# Patient Record
Sex: Male | Born: 1955 | Race: White | Hispanic: No | Smoking: Never smoker
Health system: Southern US, Community
[De-identification: ages and names within clinical notes are randomized; demographics above are authoritative.]

## PROBLEM LIST (undated history)

## (undated) DIAGNOSIS — F419 Anxiety disorder, unspecified: Secondary | ICD-10-CM

## (undated) DIAGNOSIS — M109 Gout, unspecified: Secondary | ICD-10-CM

## (undated) DIAGNOSIS — R197 Diarrhea, unspecified: Secondary | ICD-10-CM

## (undated) DIAGNOSIS — G4730 Sleep apnea, unspecified: Secondary | ICD-10-CM

## (undated) DIAGNOSIS — G629 Polyneuropathy, unspecified: Secondary | ICD-10-CM

## (undated) DIAGNOSIS — I10 Essential (primary) hypertension: Secondary | ICD-10-CM

## (undated) DIAGNOSIS — R42 Dizziness and giddiness: Secondary | ICD-10-CM

## (undated) DIAGNOSIS — E119 Type 2 diabetes mellitus without complications: Secondary | ICD-10-CM

## (undated) DIAGNOSIS — E7800 Pure hypercholesterolemia, unspecified: Secondary | ICD-10-CM

## (undated) HISTORY — DX: Essential (primary) hypertension: I10

## (undated) HISTORY — DX: Diarrhea, unspecified: R19.7

## (undated) HISTORY — DX: Pure hypercholesterolemia, unspecified: E78.00

## (undated) HISTORY — PX: OTHER SURGICAL HISTORY: SHX169

## (undated) HISTORY — DX: Type 2 diabetes mellitus without complications: E11.9

## (undated) SURGERY — COLONOSCOPY
Anesthesia: Moderate Sedation

---

## 2006-10-25 ENCOUNTER — Ambulatory Visit (HOSPITAL_COMMUNITY): Admission: RE | Admit: 2006-10-25 | Discharge: 2006-10-25 | Payer: Self-pay | Admitting: Neurology

## 2015-09-23 DIAGNOSIS — J208 Acute bronchitis due to other specified organisms: Secondary | ICD-10-CM | POA: Diagnosis not present

## 2015-09-23 DIAGNOSIS — Z0000 Encounter for general adult medical examination without abnormal findings: Secondary | ICD-10-CM | POA: Diagnosis not present

## 2015-09-23 DIAGNOSIS — I10 Essential (primary) hypertension: Secondary | ICD-10-CM | POA: Diagnosis not present

## 2015-10-20 DIAGNOSIS — J310 Chronic rhinitis: Secondary | ICD-10-CM | POA: Diagnosis not present

## 2015-10-20 DIAGNOSIS — H6983 Other specified disorders of Eustachian tube, bilateral: Secondary | ICD-10-CM | POA: Diagnosis not present

## 2015-10-20 DIAGNOSIS — H903 Sensorineural hearing loss, bilateral: Secondary | ICD-10-CM | POA: Diagnosis not present

## 2015-10-20 DIAGNOSIS — R42 Dizziness and giddiness: Secondary | ICD-10-CM | POA: Diagnosis not present

## 2015-10-20 DIAGNOSIS — J343 Hypertrophy of nasal turbinates: Secondary | ICD-10-CM | POA: Diagnosis not present

## 2015-10-22 DIAGNOSIS — H6522 Chronic serous otitis media, left ear: Secondary | ICD-10-CM | POA: Diagnosis not present

## 2015-10-22 DIAGNOSIS — H6982 Other specified disorders of Eustachian tube, left ear: Secondary | ICD-10-CM | POA: Diagnosis not present

## 2015-11-16 DIAGNOSIS — R5383 Other fatigue: Secondary | ICD-10-CM | POA: Diagnosis not present

## 2015-11-16 DIAGNOSIS — Z1389 Encounter for screening for other disorder: Secondary | ICD-10-CM | POA: Diagnosis not present

## 2015-11-16 DIAGNOSIS — E559 Vitamin D deficiency, unspecified: Secondary | ICD-10-CM | POA: Diagnosis not present

## 2015-11-16 DIAGNOSIS — Z0000 Encounter for general adult medical examination without abnormal findings: Secondary | ICD-10-CM | POA: Diagnosis not present

## 2015-11-16 DIAGNOSIS — I10 Essential (primary) hypertension: Secondary | ICD-10-CM | POA: Diagnosis not present

## 2015-11-18 DIAGNOSIS — G4710 Hypersomnia, unspecified: Secondary | ICD-10-CM | POA: Diagnosis not present

## 2015-11-18 DIAGNOSIS — Z79899 Other long term (current) drug therapy: Secondary | ICD-10-CM | POA: Diagnosis not present

## 2015-11-18 DIAGNOSIS — M545 Low back pain: Secondary | ICD-10-CM | POA: Diagnosis not present

## 2015-11-18 DIAGNOSIS — M519 Unspecified thoracic, thoracolumbar and lumbosacral intervertebral disc disorder: Secondary | ICD-10-CM | POA: Diagnosis not present

## 2015-12-09 ENCOUNTER — Ambulatory Visit (INDEPENDENT_AMBULATORY_CARE_PROVIDER_SITE_OTHER): Payer: Medicare Other | Admitting: Otolaryngology

## 2015-12-09 DIAGNOSIS — H903 Sensorineural hearing loss, bilateral: Secondary | ICD-10-CM

## 2015-12-09 DIAGNOSIS — H7202 Central perforation of tympanic membrane, left ear: Secondary | ICD-10-CM

## 2015-12-09 DIAGNOSIS — H6983 Other specified disorders of Eustachian tube, bilateral: Secondary | ICD-10-CM | POA: Diagnosis not present

## 2016-02-17 DIAGNOSIS — E1165 Type 2 diabetes mellitus with hyperglycemia: Secondary | ICD-10-CM | POA: Diagnosis not present

## 2016-02-17 DIAGNOSIS — I10 Essential (primary) hypertension: Secondary | ICD-10-CM | POA: Diagnosis not present

## 2016-02-17 DIAGNOSIS — Z131 Encounter for screening for diabetes mellitus: Secondary | ICD-10-CM | POA: Diagnosis not present

## 2016-02-17 DIAGNOSIS — L570 Actinic keratosis: Secondary | ICD-10-CM | POA: Diagnosis not present

## 2016-02-22 DIAGNOSIS — G4733 Obstructive sleep apnea (adult) (pediatric): Secondary | ICD-10-CM | POA: Diagnosis not present

## 2016-02-22 DIAGNOSIS — M519 Unspecified thoracic, thoracolumbar and lumbosacral intervertebral disc disorder: Secondary | ICD-10-CM | POA: Diagnosis not present

## 2016-02-22 DIAGNOSIS — M545 Low back pain: Secondary | ICD-10-CM | POA: Diagnosis not present

## 2016-02-22 DIAGNOSIS — Z79899 Other long term (current) drug therapy: Secondary | ICD-10-CM | POA: Diagnosis not present

## 2016-02-22 DIAGNOSIS — G4710 Hypersomnia, unspecified: Secondary | ICD-10-CM | POA: Diagnosis not present

## 2016-03-27 DIAGNOSIS — G4733 Obstructive sleep apnea (adult) (pediatric): Secondary | ICD-10-CM | POA: Diagnosis not present

## 2016-03-30 DIAGNOSIS — G4733 Obstructive sleep apnea (adult) (pediatric): Secondary | ICD-10-CM | POA: Diagnosis not present

## 2016-04-04 DIAGNOSIS — G4733 Obstructive sleep apnea (adult) (pediatric): Secondary | ICD-10-CM | POA: Diagnosis not present

## 2016-04-04 DIAGNOSIS — M79673 Pain in unspecified foot: Secondary | ICD-10-CM | POA: Diagnosis not present

## 2016-04-04 DIAGNOSIS — G4710 Hypersomnia, unspecified: Secondary | ICD-10-CM | POA: Diagnosis not present

## 2016-04-04 DIAGNOSIS — M545 Low back pain: Secondary | ICD-10-CM | POA: Diagnosis not present

## 2016-04-13 ENCOUNTER — Other Ambulatory Visit (HOSPITAL_COMMUNITY): Payer: Self-pay | Admitting: Respiratory Therapy

## 2016-04-13 DIAGNOSIS — G4733 Obstructive sleep apnea (adult) (pediatric): Secondary | ICD-10-CM

## 2016-04-30 DIAGNOSIS — G4733 Obstructive sleep apnea (adult) (pediatric): Secondary | ICD-10-CM | POA: Diagnosis not present

## 2016-05-16 DIAGNOSIS — I10 Essential (primary) hypertension: Secondary | ICD-10-CM | POA: Diagnosis not present

## 2016-05-16 DIAGNOSIS — E1165 Type 2 diabetes mellitus with hyperglycemia: Secondary | ICD-10-CM | POA: Diagnosis not present

## 2016-05-23 DIAGNOSIS — E1165 Type 2 diabetes mellitus with hyperglycemia: Secondary | ICD-10-CM | POA: Diagnosis not present

## 2016-05-23 DIAGNOSIS — I10 Essential (primary) hypertension: Secondary | ICD-10-CM | POA: Diagnosis not present

## 2016-05-23 DIAGNOSIS — N401 Enlarged prostate with lower urinary tract symptoms: Secondary | ICD-10-CM | POA: Diagnosis not present

## 2016-05-23 DIAGNOSIS — M1049 Other secondary gout, multiple sites: Secondary | ICD-10-CM | POA: Diagnosis not present

## 2016-05-31 ENCOUNTER — Encounter (INDEPENDENT_AMBULATORY_CARE_PROVIDER_SITE_OTHER): Payer: Self-pay

## 2016-05-31 ENCOUNTER — Ambulatory Visit: Payer: Medicare Other | Attending: Neurology | Admitting: Neurology

## 2016-05-31 DIAGNOSIS — G4733 Obstructive sleep apnea (adult) (pediatric): Secondary | ICD-10-CM | POA: Diagnosis not present

## 2016-05-31 DIAGNOSIS — Z79899 Other long term (current) drug therapy: Secondary | ICD-10-CM | POA: Insufficient documentation

## 2016-06-08 ENCOUNTER — Ambulatory Visit (INDEPENDENT_AMBULATORY_CARE_PROVIDER_SITE_OTHER): Payer: Medicare Other | Admitting: Otolaryngology

## 2016-06-12 NOTE — Procedures (Signed)
   Camden A. Merlene Laughter, MD     [email or web address]             NOCTURNAL POLYSOMNOGRAPHY   LOCATION: [facility]  Patient Name: Louis, House Date: 05/31/2016 Gender: Male D.O.B: [birth year] Age (years): 63 Referring Provider: Phillips Odor MD, ABSM Height (inches): 73 Interpreting Physician: Phillips Odor MD, ABSM Weight (lbs): 300 RPSGT: Peak, Robert BMI: 40 MRN: [id number] Neck Size: 20.00 CLINICAL INFORMATION Sleep Study Type: NPSG Indication for sleep study: N/A Epworth Sleepiness Score: 10 SLEEP STUDY TECHNIQUE As per the AASM Manual for the Scoring of Sleep and Associated Events v2.3 (April 2016) with a hypopnea requiring 4% desaturations. The channels recorded and monitored were frontal, central and occipital EEG, electrooculogram (EOG), submentalis EMG (chin), nasal and oral airflow, thoracic and abdominal wall motion, anterior tibialis EMG, snore microphone, electrocardiogram, and pulse oximetry. MEDICATIONS Patient's medications include: N/A. Medications self-administered by patient during sleep study : No sleep medicine administered. No current outpatient prescriptions on file. SLEEP ARCHITECTURE The study was initiated at 10:20:31 PM and ended at 5:06:46 AM. Sleep onset time was 79.8 minutes and the sleep efficiency was 62.0%. The total sleep time was 251.9 minutes. Stage REM latency was N/A minutes. The patient spent 15.06% of the night in stage N1 sleep, 84.94% in stage N2 sleep, 0.00% in stage N3 and 0.00% in REM. Alpha intrusion was absent. Supine sleep was 58.43%. RESPIRATORY PARAMETERS The overall apnea/hypopnea index (AHI) was 28.8 per hour. There were 14 total apneas, including 14 obstructive, 0 central and 0 mixed apneas. There were 107 hypopneas and 5 RERAs. The AHI during Stage REM sleep was N/A per hour. AHI while supine was 48.1 per hour. The mean oxygen saturation was 90.77%. The minimum SpO2 during sleep was  81.00%. Loud snoring was noted during this study. CARDIAC DATA The 2 lead EKG demonstrated sinus rhythm. The mean heart rate was 70.73 beats per minute. Other EKG findings include: None. LEG MOVEMENT DATA The total PLMS were 0 with a resulting PLMS index of 0.00. Associated arousal with leg movement index was 0.0.   IMPRESSIONS - Moderate obstructive sleep apnea. A formal CPAP titration study is suggested. 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   Delano Metz, MD Diplomate, American Board of Sleep Medicine.

## 2016-06-19 DIAGNOSIS — Z79891 Long term (current) use of opiate analgesic: Secondary | ICD-10-CM | POA: Diagnosis not present

## 2016-06-19 DIAGNOSIS — M79673 Pain in unspecified foot: Secondary | ICD-10-CM | POA: Diagnosis not present

## 2016-06-19 DIAGNOSIS — M545 Low back pain: Secondary | ICD-10-CM | POA: Diagnosis not present

## 2016-06-19 DIAGNOSIS — M519 Unspecified thoracic, thoracolumbar and lumbosacral intervertebral disc disorder: Secondary | ICD-10-CM | POA: Diagnosis not present

## 2016-06-19 DIAGNOSIS — G4710 Hypersomnia, unspecified: Secondary | ICD-10-CM | POA: Diagnosis not present

## 2016-06-28 DIAGNOSIS — G4733 Obstructive sleep apnea (adult) (pediatric): Secondary | ICD-10-CM | POA: Diagnosis not present

## 2016-08-24 DIAGNOSIS — M545 Low back pain: Secondary | ICD-10-CM | POA: Diagnosis not present

## 2016-08-24 DIAGNOSIS — Z79899 Other long term (current) drug therapy: Secondary | ICD-10-CM | POA: Diagnosis not present

## 2016-08-24 DIAGNOSIS — M79673 Pain in unspecified foot: Secondary | ICD-10-CM | POA: Diagnosis not present

## 2016-08-24 DIAGNOSIS — R252 Cramp and spasm: Secondary | ICD-10-CM | POA: Diagnosis not present

## 2016-08-24 DIAGNOSIS — E1140 Type 2 diabetes mellitus with diabetic neuropathy, unspecified: Secondary | ICD-10-CM | POA: Diagnosis not present

## 2016-08-29 DIAGNOSIS — I10 Essential (primary) hypertension: Secondary | ICD-10-CM | POA: Diagnosis not present

## 2016-08-29 DIAGNOSIS — E1165 Type 2 diabetes mellitus with hyperglycemia: Secondary | ICD-10-CM | POA: Diagnosis not present

## 2016-08-29 DIAGNOSIS — Z125 Encounter for screening for malignant neoplasm of prostate: Secondary | ICD-10-CM | POA: Diagnosis not present

## 2016-08-29 DIAGNOSIS — M1049 Other secondary gout, multiple sites: Secondary | ICD-10-CM | POA: Diagnosis not present

## 2016-08-29 DIAGNOSIS — N401 Enlarged prostate with lower urinary tract symptoms: Secondary | ICD-10-CM | POA: Diagnosis not present

## 2016-09-05 DIAGNOSIS — I10 Essential (primary) hypertension: Secondary | ICD-10-CM | POA: Diagnosis not present

## 2016-09-05 DIAGNOSIS — E1165 Type 2 diabetes mellitus with hyperglycemia: Secondary | ICD-10-CM | POA: Diagnosis not present

## 2016-09-06 DIAGNOSIS — H25813 Combined forms of age-related cataract, bilateral: Secondary | ICD-10-CM | POA: Diagnosis not present

## 2016-09-06 DIAGNOSIS — E119 Type 2 diabetes mellitus without complications: Secondary | ICD-10-CM | POA: Diagnosis not present

## 2016-09-06 DIAGNOSIS — Z7984 Long term (current) use of oral hypoglycemic drugs: Secondary | ICD-10-CM | POA: Diagnosis not present

## 2016-09-06 DIAGNOSIS — E1165 Type 2 diabetes mellitus with hyperglycemia: Secondary | ICD-10-CM | POA: Diagnosis not present

## 2016-09-30 DIAGNOSIS — G4733 Obstructive sleep apnea (adult) (pediatric): Secondary | ICD-10-CM | POA: Diagnosis not present

## 2016-10-23 DIAGNOSIS — M1009 Idiopathic gout, multiple sites: Secondary | ICD-10-CM | POA: Diagnosis not present

## 2016-10-23 DIAGNOSIS — E1140 Type 2 diabetes mellitus with diabetic neuropathy, unspecified: Secondary | ICD-10-CM | POA: Diagnosis not present

## 2016-10-23 DIAGNOSIS — Z79899 Other long term (current) drug therapy: Secondary | ICD-10-CM | POA: Diagnosis not present

## 2016-10-23 DIAGNOSIS — R252 Cramp and spasm: Secondary | ICD-10-CM | POA: Diagnosis not present

## 2016-10-23 DIAGNOSIS — M545 Low back pain: Secondary | ICD-10-CM | POA: Diagnosis not present

## 2016-10-31 DIAGNOSIS — G4733 Obstructive sleep apnea (adult) (pediatric): Secondary | ICD-10-CM | POA: Diagnosis not present

## 2016-11-09 DIAGNOSIS — I10 Essential (primary) hypertension: Secondary | ICD-10-CM | POA: Diagnosis not present

## 2016-11-09 DIAGNOSIS — N401 Enlarged prostate with lower urinary tract symptoms: Secondary | ICD-10-CM | POA: Diagnosis not present

## 2016-11-09 DIAGNOSIS — H8113 Benign paroxysmal vertigo, bilateral: Secondary | ICD-10-CM | POA: Diagnosis not present

## 2016-11-21 DIAGNOSIS — R197 Diarrhea, unspecified: Secondary | ICD-10-CM | POA: Diagnosis not present

## 2016-11-23 DIAGNOSIS — Z0000 Encounter for general adult medical examination without abnormal findings: Secondary | ICD-10-CM | POA: Diagnosis not present

## 2016-11-23 DIAGNOSIS — I10 Essential (primary) hypertension: Secondary | ICD-10-CM | POA: Diagnosis not present

## 2016-11-23 DIAGNOSIS — H8113 Benign paroxysmal vertigo, bilateral: Secondary | ICD-10-CM | POA: Diagnosis not present

## 2016-11-26 DIAGNOSIS — R197 Diarrhea, unspecified: Secondary | ICD-10-CM | POA: Diagnosis not present

## 2016-11-28 DIAGNOSIS — G4733 Obstructive sleep apnea (adult) (pediatric): Secondary | ICD-10-CM | POA: Diagnosis not present

## 2016-12-01 DIAGNOSIS — R197 Diarrhea, unspecified: Secondary | ICD-10-CM | POA: Diagnosis not present

## 2016-12-20 DIAGNOSIS — M519 Unspecified thoracic, thoracolumbar and lumbosacral intervertebral disc disorder: Secondary | ICD-10-CM | POA: Diagnosis not present

## 2016-12-20 DIAGNOSIS — E1140 Type 2 diabetes mellitus with diabetic neuropathy, unspecified: Secondary | ICD-10-CM | POA: Diagnosis not present

## 2016-12-20 DIAGNOSIS — M79673 Pain in unspecified foot: Secondary | ICD-10-CM | POA: Diagnosis not present

## 2016-12-20 DIAGNOSIS — M545 Low back pain: Secondary | ICD-10-CM | POA: Diagnosis not present

## 2016-12-20 DIAGNOSIS — Z79891 Long term (current) use of opiate analgesic: Secondary | ICD-10-CM | POA: Diagnosis not present

## 2016-12-20 DIAGNOSIS — Z79899 Other long term (current) drug therapy: Secondary | ICD-10-CM | POA: Diagnosis not present

## 2016-12-29 DIAGNOSIS — G4733 Obstructive sleep apnea (adult) (pediatric): Secondary | ICD-10-CM | POA: Diagnosis not present

## 2017-01-10 ENCOUNTER — Encounter (INDEPENDENT_AMBULATORY_CARE_PROVIDER_SITE_OTHER): Payer: Self-pay | Admitting: Internal Medicine

## 2017-01-12 ENCOUNTER — Ambulatory Visit (INDEPENDENT_AMBULATORY_CARE_PROVIDER_SITE_OTHER): Payer: Medicare Other | Admitting: Internal Medicine

## 2017-01-12 ENCOUNTER — Encounter (INDEPENDENT_AMBULATORY_CARE_PROVIDER_SITE_OTHER): Payer: Self-pay

## 2017-01-12 ENCOUNTER — Encounter (INDEPENDENT_AMBULATORY_CARE_PROVIDER_SITE_OTHER): Payer: Self-pay | Admitting: Internal Medicine

## 2017-01-12 VITALS — BP 116/72 | HR 72 | Temp 98.0°F | Ht 73.0 in | Wt 301.5 lb

## 2017-01-12 DIAGNOSIS — E7800 Pure hypercholesterolemia, unspecified: Secondary | ICD-10-CM

## 2017-01-12 DIAGNOSIS — R634 Abnormal weight loss: Secondary | ICD-10-CM | POA: Diagnosis not present

## 2017-01-12 DIAGNOSIS — R197 Diarrhea, unspecified: Secondary | ICD-10-CM | POA: Diagnosis not present

## 2017-01-12 DIAGNOSIS — E119 Type 2 diabetes mellitus without complications: Secondary | ICD-10-CM

## 2017-01-12 DIAGNOSIS — I10 Essential (primary) hypertension: Secondary | ICD-10-CM

## 2017-01-12 HISTORY — DX: Type 2 diabetes mellitus without complications: E11.9

## 2017-01-12 HISTORY — DX: Essential (primary) hypertension: I10

## 2017-01-12 HISTORY — DX: Pure hypercholesterolemia, unspecified: E78.00

## 2017-01-12 NOTE — Progress Notes (Addendum)
   Subjective:    Patient ID: Louis House, male    DOB: [birth year], 60 y.o.   MRN: [id number]  HPI Referred by Dr. Sherrie Sport for diarrhea. Stool studies 11/26/16 revealed  Moderate growth of staph. Covered with Bactrim DS BID x 10 days.  He says first of year he had a URI infection and was on antibiotic. He also  Had inner ear and was given another antibiotic.  He says after taking antibiotics, he developed diarrhea. He says he was treated for the diarrhea x 2 x 10 days.  He says certain foods would also cause diarrhea. Cheese gives him diarrhea.  He tells me now he is having about 2-3 stools a day. Stools are mushy. Some stools are formed.   His appetite is good.  He says he has lost about 20 pound since March.  No abdominal pain.  He says he feels 50% better.  His last colonoscopy was 8 yrs ago by Dr. Rowe Pavy and was normal.  H. Pylori was negative. C diff was negative.   Last colonoscopy in 2009 Dr. Rowe Pavy (screening) was normal. Moderate diverticulosis seen left colon.  Diabetic for a couple of years.   Review of Systems Past Medical History:  Diagnosis Date  . Diabetes ([place]) 01/12/2017  . Essential hypertension, benign 01/12/2017  . High cholesterol 01/12/2017    No past surgical history on file.  No Known Allergies  No current outpatient prescriptions on file prior to visit.   No current facility-administered medications on file prior to visit.        Objective:   Physical Exam Blood pressure 116/72, pulse 72, temperature 98 F (36.7 C), height 6' 1"  (1.854 m), weight (!) 301 lb 8 oz (136.8 kg).  Alert and oriented. Skin warm and dry. Oral mucosa is moist.   . Sclera anicteric, conjunctivae is pink. Thyroid not enlarged. No cervical lymphadenopathy. Lungs clear. Heart regular rate and rhythm.  Abdomen is soft. Bowel sounds are positive. No hepatomegaly. No abdominal masses felt. No tenderness.  No edema to lower extremities.         Assessment & Plan:  Diarrhea, weight loss.  GI pathogen. CT abdomen/pelvis with CM

## 2017-01-12 NOTE — Patient Instructions (Signed)
Labs and CT.

## 2017-01-13 DIAGNOSIS — R197 Diarrhea, unspecified: Secondary | ICD-10-CM | POA: Diagnosis not present

## 2017-01-18 DIAGNOSIS — R197 Diarrhea, unspecified: Secondary | ICD-10-CM | POA: Diagnosis not present

## 2017-01-18 DIAGNOSIS — I700 Atherosclerosis of aorta: Secondary | ICD-10-CM | POA: Diagnosis not present

## 2017-01-18 DIAGNOSIS — K5730 Diverticulosis of large intestine without perforation or abscess without bleeding: Secondary | ICD-10-CM | POA: Diagnosis not present

## 2017-01-22 ENCOUNTER — Telehealth (INDEPENDENT_AMBULATORY_CARE_PROVIDER_SITE_OTHER): Payer: Self-pay | Admitting: *Deleted

## 2017-01-22 ENCOUNTER — Encounter (INDEPENDENT_AMBULATORY_CARE_PROVIDER_SITE_OTHER): Payer: Self-pay

## 2017-01-22 NOTE — Telephone Encounter (Signed)
Patient called and left a voicemail. He states that he was calling Terri Setzer,NP. He had a CT last Thursday , and stools done week before. He ask that you call him back at [phone]

## 2017-01-23 ENCOUNTER — Telehealth (INDEPENDENT_AMBULATORY_CARE_PROVIDER_SITE_OTHER): Payer: Self-pay | Admitting: *Deleted

## 2017-01-23 NOTE — Telephone Encounter (Signed)
I called patient yesterday and have left a message today about his results. I do have GI pathogen yet.

## 2017-01-23 NOTE — Telephone Encounter (Signed)
Patient called left message stating he will be mowing most of the day and when he gets a call back he asked for it to be around 4:00. Per Patient Karna Christmas is the one returning his call.

## 2017-01-24 ENCOUNTER — Telehealth (INDEPENDENT_AMBULATORY_CARE_PROVIDER_SITE_OTHER): Payer: Self-pay | Admitting: Internal Medicine

## 2017-01-24 ENCOUNTER — Encounter (INDEPENDENT_AMBULATORY_CARE_PROVIDER_SITE_OTHER): Payer: Self-pay | Admitting: *Deleted

## 2017-01-24 DIAGNOSIS — R197 Diarrhea, unspecified: Secondary | ICD-10-CM

## 2017-01-24 MED ORDER — DICYCLOMINE HCL 10 MG PO CAPS: 10.0000 mg | ORAL_CAPSULE | Freq: Three times a day (TID) | ORAL | 5 refills | Status: DC

## 2017-01-24 NOTE — Telephone Encounter (Signed)
[name], OV in  4 weeks.

## 2017-01-24 NOTE — Telephone Encounter (Signed)
Patient called left message returning Terri's call. Please call patient at [phone]

## 2017-01-24 NOTE — Telephone Encounter (Signed)
Terri tried calling the patient on the morning on 01/23/17. No answer. This message was sent to her as well , and she will contact patient.

## 2017-01-24 NOTE — Telephone Encounter (Signed)
OV sch'd 02/21/17 at 215, left detailed message and mailed appt letter

## 2017-01-24 NOTE — Telephone Encounter (Signed)
Rx for dicyclomine TID.

## 2017-01-24 NOTE — Telephone Encounter (Signed)
I have sent an RX in for Dicyclomine TID to his pharmacy. He will have OV in 4 weeks. If he is not better, he will need a colonoscopy.

## 2017-01-25 NOTE — Telephone Encounter (Signed)
I have talked with patient

## 2017-01-28 DIAGNOSIS — G4733 Obstructive sleep apnea (adult) (pediatric): Secondary | ICD-10-CM | POA: Diagnosis not present

## 2017-02-01 ENCOUNTER — Encounter (INDEPENDENT_AMBULATORY_CARE_PROVIDER_SITE_OTHER): Payer: Self-pay

## 2017-02-07 DIAGNOSIS — G4733 Obstructive sleep apnea (adult) (pediatric): Secondary | ICD-10-CM | POA: Diagnosis not present

## 2017-02-15 DIAGNOSIS — G4733 Obstructive sleep apnea (adult) (pediatric): Secondary | ICD-10-CM | POA: Diagnosis not present

## 2017-02-21 ENCOUNTER — Other Ambulatory Visit (INDEPENDENT_AMBULATORY_CARE_PROVIDER_SITE_OTHER): Payer: Self-pay | Admitting: Internal Medicine

## 2017-02-21 ENCOUNTER — Encounter (INDEPENDENT_AMBULATORY_CARE_PROVIDER_SITE_OTHER): Payer: Self-pay | Admitting: *Deleted

## 2017-02-21 ENCOUNTER — Encounter (INDEPENDENT_AMBULATORY_CARE_PROVIDER_SITE_OTHER): Payer: Self-pay | Admitting: Internal Medicine

## 2017-02-21 ENCOUNTER — Ambulatory Visit (INDEPENDENT_AMBULATORY_CARE_PROVIDER_SITE_OTHER): Payer: Medicare Other | Admitting: Internal Medicine

## 2017-02-21 ENCOUNTER — Telehealth (INDEPENDENT_AMBULATORY_CARE_PROVIDER_SITE_OTHER): Payer: Self-pay | Admitting: *Deleted

## 2017-02-21 VITALS — BP 146/90 | HR 72 | Temp 97.4°F | Ht 73.0 in | Wt 306.0 lb

## 2017-02-21 DIAGNOSIS — R197 Diarrhea, unspecified: Secondary | ICD-10-CM

## 2017-02-21 HISTORY — DX: Diarrhea, unspecified: R19.7

## 2017-02-21 NOTE — Patient Instructions (Signed)
The risks of bleeding, perforation and infection were reviewed with patient.  

## 2017-02-21 NOTE — Telephone Encounter (Signed)
Patient needs trilyte 

## 2017-02-21 NOTE — Progress Notes (Signed)
Subjective:    Patient ID: Louis House, male    DOB: 08/24/1956, 61 y.o.   MRN: [id number]  HPI hee today for f/u. Last seen in May for diarrhea. He had stool studies by Dr. Sherrie Sport which revealed moderate growth of staph. Covered with Bactrim DS BID x 10 day x 2.  Also states first of year he had an URI and was covered with an antibiotic. Also had a inner ear ear infection and given an antibiotic.  After taking antibiotics he developed diarrhea.     His stools did improve. At [place], he was having 2-3 stools a day. He stated cheese gave him diarrhea. Weight loss of 20 pounds since March. He felt 50% better. There was no abdominal pain.  01/16/2017 GI pathogen was negative.   Colonoscopy in 2009 normal. Moderate diverticulosis in left colon. Dr. Rowe Pavy.  Today he states he is having about 2-3 stools a day. He says he know what not to eat. He says he has never had this problems before. Last weight in May was 301. Today his weight is 306. He has been diet pills for the energy.  No rectal bleeding. Appetite is normal.   CT scan of abdomen/pelvis with CM on 01/18/2017: revealed no acute abnormalities.  Patient would like to proceed with a colonoscopy.  Review of Systems  Past Medical History:  Diagnosis Date  . Diabetes ([place]) 01/12/2017  . Diarrhea 02/21/2017  . Essential hypertension, benign 01/12/2017  . High cholesterol 01/12/2017    Past Surgical History:  Procedure Laterality Date  . bone spurs    . kneee arthroscopy     both knees  . Left shoulder surgery from injury    . Umblical hernia      No Known Allergies  Current Outpatient Prescriptions on File Prior to Visit  Medication Sig Dispense Refill  . allopurinol (ZYLOPRIM) 300 MG tablet Take 300 mg by mouth daily.    Marland Kitchen atorvastatin (LIPITOR) 40 MG tablet Take 40 mg by mouth daily.    . diazepam (VALIUM) 10 MG tablet Take 10 mg by mouth daily.    . diclofenac (VOLTAREN) 75 MG EC tablet Take 75 mg by mouth 2 (two) times daily.    Marland Kitchen  dicyclomine (BENTYL) 10 MG capsule Take 1 capsule (10 mg total) by mouth 3 (three) times daily before meals. 90 capsule 5  . DULoxetine (CYMBALTA) 30 MG capsule Take 30 mg by mouth daily.    Marland Kitchen gabapentin (NEURONTIN) 400 MG capsule Take 400 mg by mouth 2 (two) times daily.     Marland Kitchen HYDROcodone-acetaminophen (NORCO) 7.5-325 MG tablet Take 1 tablet by mouth every 6 (six) hours as needed for moderate pain.    Marland Kitchen loperamide (IMODIUM) 2 MG capsule Take by mouth as needed for diarrhea or loose stools.    Marland Kitchen loratadine (CLARITIN) 10 MG tablet Take 10 mg by mouth daily.    . meclizine (ANTIVERT) 25 MG tablet Take 25 mg by mouth 3 (three) times daily as needed for dizziness.    . metFORMIN (GLUCOPHAGE) 500 MG tablet Take by mouth 2 (two) times daily with a meal.     No current facility-administered medications on file prior to visit.         Objective:   Physical Exam Blood pressure (!) 146/90, pulse 72, temperature 97.4 F (36.3 C), height 6' 1"  (1.854 m), weight (!) 306 lb (138.8 kg).  .Alert and oriented. Skin warm and dry. Oral mucosa is moist.   .  Sclera anicteric, conjunctivae is pink. Thyroid not enlarged. No cervical lymphadenopathy. Lungs clear. Heart regular rate and rhythm.  Abdomen is soft. Bowel sounds are positive. No hepatomegaly. No abdominal masses felt. No tenderness.  No edema to lower extremities.          Assessment & Plan:  Diarrhea, change in stool. Will go and proceed with a colonoscopy. The risks of bleeding, perforation and infection were reviewed with patient.

## 2017-02-22 DIAGNOSIS — M545 Low back pain: Secondary | ICD-10-CM | POA: Diagnosis not present

## 2017-02-22 DIAGNOSIS — Z79899 Other long term (current) drug therapy: Secondary | ICD-10-CM | POA: Diagnosis not present

## 2017-02-22 DIAGNOSIS — G4710 Hypersomnia, unspecified: Secondary | ICD-10-CM | POA: Diagnosis not present

## 2017-02-22 DIAGNOSIS — E1140 Type 2 diabetes mellitus with diabetic neuropathy, unspecified: Secondary | ICD-10-CM | POA: Diagnosis not present

## 2017-02-22 DIAGNOSIS — M79673 Pain in unspecified foot: Secondary | ICD-10-CM | POA: Diagnosis not present

## 2017-02-22 MED ORDER — PEG 3350-KCL-NA BICARB-NACL 420 G PO SOLR: 4000.0000 mL | Freq: Once | ORAL | 0 refills | Status: AC

## 2017-02-23 DIAGNOSIS — E119 Type 2 diabetes mellitus without complications: Secondary | ICD-10-CM | POA: Diagnosis not present

## 2017-02-23 DIAGNOSIS — M1009 Idiopathic gout, multiple sites: Secondary | ICD-10-CM | POA: Diagnosis not present

## 2017-02-23 DIAGNOSIS — I10 Essential (primary) hypertension: Secondary | ICD-10-CM | POA: Diagnosis not present

## 2017-02-28 DIAGNOSIS — G4733 Obstructive sleep apnea (adult) (pediatric): Secondary | ICD-10-CM | POA: Diagnosis not present

## 2017-03-21 DIAGNOSIS — I10 Essential (primary) hypertension: Secondary | ICD-10-CM | POA: Diagnosis not present

## 2017-03-21 DIAGNOSIS — E119 Type 2 diabetes mellitus without complications: Secondary | ICD-10-CM | POA: Diagnosis not present

## 2017-03-29 NOTE — Patient Instructions (Signed)
Louis House  03/29/2017     @PREFPERIOPPHARMACY @   Your procedure is scheduled on  04/06/2017  Report to [facility] at  1215  P.M.  Call this number if you have problems the morning of surgery:  [phone]   Remember:  Do not eat food or drink liquids after midnight.  Take these medicines the morning of surgery with A SIP OF WATER  Allopurinol, valium, voltaren, cymbalta, neurontin, hydrocodone, claritin, metoprolol, zanaflex.   Do not wear jewelry, make-up or nail polish.  Do not wear lotions, powders, or perfumes, or deoderant.  Do not shave 48 hours prior to surgery.  Men may shave face and neck.  Do not bring valuables to the hospital.  [facility] is not responsible for any belongings or valuables.  Contacts, dentures or bridgework may not be worn into surgery.  Leave your suitcase in the car.  After surgery it may be brought to your room.  For patients admitted to the hospital, discharge time will be determined by your treatment team.  Patients discharged the day of surgery will not be allowed to drive home.   Name and phone number of your driver:   family Special instructions:  Follow the diet and prep instructions given to you by Dr Olevia Perches office.  Please read over the following fact sheets that you were given. Anesthesia Post-op Instructions and Care and Recovery After Surgery       Colonoscopy, Adult A colonoscopy is an exam to look at the entire large intestine. During the exam, a lubricated, bendable tube is inserted into the anus and then passed into the rectum, colon, and other parts of the large intestine. A colonoscopy is often done as a part of normal colorectal screening or in response to certain symptoms, such as anemia, persistent diarrhea, abdominal pain, and blood in the stool. The exam can help screen for and diagnose medical problems, including:  Tumors.  Polyps.  Inflammation.  Areas of bleeding.  Tell a health care  provider about:  Any allergies you have.  All medicines you are taking, including vitamins, herbs, eye drops, creams, and over-the-counter medicines.  Any problems you or family members have had with anesthetic medicines.  Any blood disorders you have.  Any surgeries you have had.  Any medical conditions you have.  Any problems you have had passing stool. What are the risks? Generally, this is a safe procedure. However, problems may occur, including:  Bleeding.  A tear in the intestine.  A reaction to medicines given during the exam.  Infection (rare).  What happens before the procedure? Eating and drinking restrictions Follow instructions from your health care provider about eating and drinking, which may include:  A few days before the procedure - follow a low-fiber diet. Avoid nuts, seeds, dried fruit, raw fruits, and vegetables.  1-3 days before the procedure - follow a clear liquid diet. Drink only clear liquids, such as clear broth or bouillon, black coffee or tea, clear juice, clear soft drinks or sports drinks, gelatin dessert, and popsicles. Avoid any liquids that contain red or purple dye.  On the day of the procedure - do not eat or drink anything during the 2 hours before the procedure, or within the time period that your health care provider recommends.  Bowel prep If you were prescribed an oral bowel prep to clean out your colon:  Take it as told by your health care provider. Starting the  day before your procedure, you will need to drink a large amount of medicated liquid. The liquid will cause you to have multiple loose stools until your stool is almost clear or light green.  If your skin or anus gets irritated from diarrhea, you may use these to relieve the irritation: ? Medicated wipes, such as adult wet wipes with aloe and vitamin E. ? A skin soothing-product like petroleum jelly.  If you vomit while drinking the bowel prep, take a break for up to 60  minutes and then begin the bowel prep again. If vomiting continues and you cannot take the bowel prep without vomiting, call your health care provider.  General instructions  Ask your health care provider about changing or stopping your regular medicines. This is especially important if you are taking diabetes medicines or blood thinners.  Plan to have someone take you home from the hospital or clinic. What happens during the procedure?  An IV tube may be inserted into one of your veins.  You will be given medicine to help you relax (sedative).  To reduce your risk of infection: ? Your health care team will wash or sanitize their hands. ? Your anal area will be washed with soap.  You will be asked to lie on your side with your knees bent.  Your health care provider will lubricate a long, thin, flexible tube. The tube will have a camera and a light on the end.  The tube will be inserted into your anus.  The tube will be gently eased through your rectum and colon.  Air will be delivered into your colon to keep it open. You may feel some pressure or cramping.  The camera will be used to take images during the procedure.  A small tissue sample may be removed from your body to be examined under a microscope (biopsy). If any potential problems are found, the tissue will be sent to a lab for testing.  If small polyps are found, your health care provider may remove them and have them checked for cancer cells.  The tube that was inserted into your anus will be slowly removed. The procedure may vary among health care providers and hospitals. What happens after the procedure?  Your blood pressure, heart rate, breathing rate, and blood oxygen level will be monitored until the medicines you were given have worn off.  Do not drive for 24 hours after the exam.  You may have a small amount of blood in your stool.  You may pass gas and have mild abdominal cramping or bloating due to the air  that was used to inflate your colon during the exam.  It is up to you to get the results of your procedure. Ask your health care provider, or the department performing the procedure, when your results will be ready. This information is not intended to replace advice given to you by your health care provider. Make sure you discuss any questions you have with your health care provider. Document Released: 08/25/2000 Document Revised: 06/28/2016 Document Reviewed: 11/09/2015 Elsevier Interactive Patient Education  2018 [employer].  Colonoscopy, Adult, Care After This sheet gives you information about how to care for yourself after your procedure. Your health care provider may also give you more specific instructions. If you have problems or questions, contact your health care provider. What can I expect after the procedure? After the procedure, it is common to have:  A small amount of blood in your stool for 24 hours  after the procedure.  Some gas.  Mild abdominal cramping or bloating.  Follow these instructions at home: General instructions   For the first 24 hours after the procedure: ? Do not drive or use machinery. ? Do not sign important documents. ? Do not drink alcohol. ? Do your regular daily activities at a slower pace than normal. ? Eat soft, easy-to-digest foods. ? Rest often.  Take over-the-counter or prescription medicines only as told by your health care provider.  It is up to you to get the results of your procedure. Ask your health care provider, or the department performing the procedure, when your results will be ready. Relieving cramping and bloating  Try walking around when you have cramps or feel bloated.  Apply heat to your abdomen as told by your health care provider. Use a heat source that your health care provider recommends, such as a moist heat pack or a heating pad. ? Place a towel between your skin and the heat source. ? Leave the heat on for 20-30  minutes. ? Remove the heat if your skin turns bright red. This is especially important if you are unable to feel pain, heat, or cold. You may have a greater risk of getting burned. Eating and drinking  Drink enough fluid to keep your urine clear or pale yellow.  Resume your normal diet as instructed by your health care provider. Avoid heavy or fried foods that are hard to digest.  Avoid drinking alcohol for as long as instructed by your health care provider. Contact a health care provider if:  You have blood in your stool 2-3 days after the procedure. Get help right away if:  You have more than a small spotting of blood in your stool.  You pass large blood clots in your stool.  Your abdomen is swollen.  You have nausea or vomiting.  You have a fever.  You have increasing abdominal pain that is not relieved with medicine. This information is not intended to replace advice given to you by your health care provider. Make sure you discuss any questions you have with your health care provider. Document Released: 04/11/2004 Document Revised: 05/22/2016 Document Reviewed: 11/09/2015 Elsevier Interactive Patient Education  2018 [place] Anesthesia is a term that refers to techniques, procedures, and medicines that help a person stay safe and comfortable during a medical procedure. Monitored anesthesia care, or sedation, is one type of anesthesia. Your anesthesia specialist may recommend sedation if you will be having a procedure that does not require you to be unconscious, such as:  Cataract surgery.  A dental procedure.  A biopsy.  A colonoscopy.  During the procedure, you may receive a medicine to help you relax (sedative). There are three levels of sedation:  Mild sedation. At this level, you may feel awake and relaxed. You will be able to follow directions.  Moderate sedation. At this level, you will be sleepy. You may not remember the  procedure.  Deep sedation. At this level, you will be asleep. You will not remember the procedure.  The more medicine you are given, the deeper your level of sedation will be. Depending on how you respond to the procedure, the anesthesia specialist may change your level of sedation or the type of anesthesia to fit your needs. An anesthesia specialist will monitor you closely during the procedure. Let your health care provider know about:  Any allergies you have.  All medicines you are taking, including vitamins, herbs,  eye drops, creams, and over-the-counter medicines.  Any use of steroids (by mouth or as a cream).  Any problems you or family members have had with sedatives and anesthetic medicines.  Any blood disorders you have.  Any surgeries you have had.  Any medical conditions you have, such as sleep apnea.  Whether you are pregnant or may be pregnant.  Any use of cigarettes, alcohol, or street drugs. What are the risks? Generally, this is a safe procedure. However, problems may occur, including:  Getting too much medicine (oversedation).  Nausea.  Allergic reaction to medicines.  Trouble breathing. If this happens, a breathing tube may be used to help with breathing. It will be removed when you are awake and breathing on your own.  Heart trouble.  Lung trouble.  Before the procedure Staying hydrated Follow instructions from your health care provider about hydration, which may include:  Up to 2 hours before the procedure - you may continue to drink clear liquids, such as water, clear fruit juice, black coffee, and plain tea.  Eating and drinking restrictions Follow instructions from your health care provider about eating and drinking, which may include:  8 hours before the procedure - stop eating heavy meals or foods such as meat, fried foods, or fatty foods.  6 hours before the procedure - stop eating light meals or foods, such as toast or cereal.  6 hours  before the procedure - stop drinking milk or drinks that contain milk.  2 hours before the procedure - stop drinking clear liquids.  Medicines Ask your health care provider about:  Changing or stopping your regular medicines. This is especially important if you are taking diabetes medicines or blood thinners.  Taking medicines such as aspirin and ibuprofen. These medicines can thin your blood. Do not take these medicines before your procedure if your health care provider instructs you not to.  Tests and exams  You will have a physical exam.  You may have blood tests done to show: ? How well your kidneys and liver are working. ? How well your blood can clot.  General instructions  Plan to have someone take you home from the hospital or clinic.  If you will be going home right after the procedure, plan to have someone with you for 24 hours.  What happens during the procedure?  Your blood pressure, heart rate, breathing, level of pain and overall condition will be monitored.  An IV tube will be inserted into one of your veins.  Your anesthesia specialist will give you medicines as needed to keep you comfortable during the procedure. This may mean changing the level of sedation.  The procedure will be performed. After the procedure  Your blood pressure, heart rate, breathing rate, and blood oxygen level will be monitored until the medicines you were given have worn off.  Do not drive for 24 hours if you received a sedative.  You may: ? Feel sleepy, clumsy, or nauseous. ? Feel forgetful about what happened after the procedure. ? Have a sore throat if you had a breathing tube during the procedure. ? Vomit. This information is not intended to replace advice given to you by your health care provider. Make sure you discuss any questions you have with your health care provider. Document Released: 05/24/2005 Document Revised: 02/04/2016 Document Reviewed: 12/19/2015 Elsevier  Interactive Patient Education  2018 [place], Care After These instructions provide you with information about caring for yourself after your procedure. Your health  care provider may also give you more specific instructions. Your treatment has been planned according to current medical practices, but problems sometimes occur. Call your health care provider if you have any problems or questions after your procedure. What can I expect after the procedure? After your procedure, it is common to:  Feel sleepy for several hours.  Feel clumsy and have poor balance for several hours.  Feel forgetful about what happened after the procedure.  Have poor judgment for several hours.  Feel nauseous or vomit.  Have a sore throat if you had a breathing tube during the procedure.  Follow these instructions at home: For at least 24 hours after the procedure:   Do not: ? Participate in activities in which you could fall or become injured. ? Drive. ? Use heavy machinery. ? Drink alcohol. ? Take sleeping pills or medicines that cause drowsiness. ? Make important decisions or sign legal documents. ? Take care of children on your own.  Rest. Eating and drinking  Follow the diet that is recommended by your health care provider.  If you vomit, drink water, juice, or soup when you can drink without vomiting.  Make sure you have little or no nausea before eating solid foods. General instructions  Have a responsible adult stay with you until you are awake and alert.  Take over-the-counter and prescription medicines only as told by your health care provider.  If you smoke, do not smoke without supervision.  Keep all follow-up visits as told by your health care provider. This is important. Contact a health care provider if:  You keep feeling nauseous or you keep vomiting.  You feel light-headed.  You develop a rash.  You have a fever. Get help right away  if:  You have trouble breathing. This information is not intended to replace advice given to you by your health care provider. Make sure you discuss any questions you have with your health care provider. Document Released: 12/19/2015 Document Revised: 04/19/2016 Document Reviewed: 12/19/2015 Elsevier Interactive Patient Education  [employer].

## 2017-03-30 DIAGNOSIS — G4733 Obstructive sleep apnea (adult) (pediatric): Secondary | ICD-10-CM | POA: Diagnosis not present

## 2017-04-03 ENCOUNTER — Encounter (HOSPITAL_COMMUNITY)
Admission: RE | Admit: 2017-04-03 | Discharge: 2017-04-03 | Disposition: A | Discharge status: Home / Self Care | Payer: Medicare Other | Source: Ambulatory Visit | Attending: Internal Medicine | Admitting: Internal Medicine

## 2017-04-03 ENCOUNTER — Encounter (HOSPITAL_COMMUNITY): Payer: Self-pay

## 2017-04-03 DIAGNOSIS — K5730 Diverticulosis of large intestine without perforation or abscess without bleeding: Secondary | ICD-10-CM | POA: Diagnosis not present

## 2017-04-03 DIAGNOSIS — Z79899 Other long term (current) drug therapy: Secondary | ICD-10-CM | POA: Diagnosis not present

## 2017-04-03 DIAGNOSIS — R197 Diarrhea, unspecified: Secondary | ICD-10-CM | POA: Diagnosis present

## 2017-04-03 DIAGNOSIS — E7800 Pure hypercholesterolemia, unspecified: Secondary | ICD-10-CM | POA: Diagnosis not present

## 2017-04-03 DIAGNOSIS — E739 Lactose intolerance, unspecified: Secondary | ICD-10-CM | POA: Diagnosis not present

## 2017-04-03 DIAGNOSIS — E119 Type 2 diabetes mellitus without complications: Secondary | ICD-10-CM | POA: Diagnosis not present

## 2017-04-03 DIAGNOSIS — Z7984 Long term (current) use of oral hypoglycemic drugs: Secondary | ICD-10-CM | POA: Diagnosis not present

## 2017-04-03 DIAGNOSIS — I10 Essential (primary) hypertension: Secondary | ICD-10-CM | POA: Diagnosis not present

## 2017-04-03 DIAGNOSIS — M109 Gout, unspecified: Secondary | ICD-10-CM | POA: Diagnosis not present

## 2017-04-03 DIAGNOSIS — F419 Anxiety disorder, unspecified: Secondary | ICD-10-CM | POA: Diagnosis not present

## 2017-04-03 DIAGNOSIS — K633 Ulcer of intestine: Secondary | ICD-10-CM | POA: Diagnosis not present

## 2017-04-03 DIAGNOSIS — K529 Noninfective gastroenteritis and colitis, unspecified: Secondary | ICD-10-CM | POA: Diagnosis not present

## 2017-04-03 DIAGNOSIS — Z6839 Body mass index (BMI) 39.0-39.9, adult: Secondary | ICD-10-CM | POA: Diagnosis not present

## 2017-04-03 HISTORY — DX: Gout, unspecified: M10.9

## 2017-04-03 HISTORY — DX: Polyneuropathy, unspecified: G62.9

## 2017-04-03 HISTORY — DX: Dizziness and giddiness: R42

## 2017-04-03 HISTORY — DX: Anxiety disorder, unspecified: F41.9

## 2017-04-03 LAB — BASIC METABOLIC PANEL
ANION GAP: 8 (ref 5–15)
BUN: 18 mg/dL (ref 6–20)
CHLORIDE: 95 mmol/L — AB (ref 101–111)
CO2: 33 mmol/L — AB (ref 22–32)
Calcium: 9.5 mg/dL (ref 8.9–10.3)
Creatinine, Ser: 1.21 mg/dL (ref 0.61–1.24)
GFR calc non Af Amer: >60 mL/min (ref >60)
GLUCOSE: 167 mg/dL — AB (ref 65–99)
Potassium: 3.5 mmol/L (ref 3.5–5.1)
Sodium: 136 mmol/L (ref 135–145)

## 2017-04-03 LAB — CBC WITH DIFFERENTIAL/PLATELET
BASOS ABS: 0.1 10*3/uL (ref 0.0–0.1)
Basophils Relative: 1 %
Eosinophils Absolute: 0.3 10*3/uL (ref 0.0–0.7)
Eosinophils Relative: 4 %
HEMATOCRIT: 41.5 % (ref 39.0–52.0)
HEMOGLOBIN: 13.5 g/dL (ref 13.0–17.0)
LYMPHS PCT: 23 %
Lymphs Abs: 1.6 10*3/uL (ref 0.7–4.0)
MCH: 29 pg (ref 26.0–34.0)
MCHC: 32.5 g/dL (ref 30.0–36.0)
MCV: 89.1 fL (ref 78.0–100.0)
MONO ABS: 0.6 10*3/uL (ref 0.1–1.0)
MONOS PCT: 8 %
NEUTROS ABS: 4.7 10*3/uL (ref 1.7–7.7)
NEUTROS PCT: 64 %
Platelets: 304 10*3/uL (ref 150–400)
RBC: 4.66 MIL/uL (ref 4.22–5.81)
RDW: 12.9 % (ref 11.5–15.5)
WBC: 7.3 10*3/uL (ref 4.0–10.5)

## 2017-04-03 NOTE — Progress Notes (Signed)
   04/03/17 0907  OBSTRUCTIVE SLEEP APNEA  Have you ever been diagnosed with sleep apnea through a sleep study? No  Do you snore loudly (loud enough to be heard through closed doors)?  1  Do you often feel tired, fatigued, or sleepy during the daytime (such as falling asleep during driving or talking to someone)? 0  Has anyone observed you stop breathing during your sleep? 0  Do you have, or are you being treated for high blood pressure? 1  BMI more than 35 kg/m2? 1  Age > 50 (1-yes) 1  Neck circumference greater than:Male 16 inches or larger, Male 17inches or larger? 1  Male Gender (Yes=1) 1  Obstructive Sleep Apnea Score 6  Score 5 or greater  Results sent to PCP

## 2017-04-06 ENCOUNTER — Ambulatory Visit (HOSPITAL_COMMUNITY): Payer: Medicare Other | Admitting: Anesthesiology

## 2017-04-06 ENCOUNTER — Encounter (HOSPITAL_COMMUNITY)
Admission: RE | Disposition: A | Discharge status: Home / Self Care | Payer: Self-pay | Source: Ambulatory Visit | Attending: Internal Medicine

## 2017-04-06 ENCOUNTER — Ambulatory Visit (HOSPITAL_COMMUNITY)
Admission: RE | Admit: 2017-04-06 | Discharge: 2017-04-06 | Disposition: A | Discharge status: Home / Self Care | Payer: Medicare Other | Source: Ambulatory Visit | Attending: Internal Medicine | Admitting: Internal Medicine

## 2017-04-06 ENCOUNTER — Encounter (HOSPITAL_COMMUNITY): Payer: Self-pay | Admitting: *Deleted

## 2017-04-06 DIAGNOSIS — Z79899 Other long term (current) drug therapy: Secondary | ICD-10-CM | POA: Insufficient documentation

## 2017-04-06 DIAGNOSIS — K5730 Diverticulosis of large intestine without perforation or abscess without bleeding: Secondary | ICD-10-CM | POA: Diagnosis not present

## 2017-04-06 DIAGNOSIS — K6389 Other specified diseases of intestine: Secondary | ICD-10-CM | POA: Diagnosis not present

## 2017-04-06 DIAGNOSIS — E119 Type 2 diabetes mellitus without complications: Secondary | ICD-10-CM | POA: Insufficient documentation

## 2017-04-06 DIAGNOSIS — K633 Ulcer of intestine: Secondary | ICD-10-CM | POA: Insufficient documentation

## 2017-04-06 DIAGNOSIS — E739 Lactose intolerance, unspecified: Secondary | ICD-10-CM | POA: Insufficient documentation

## 2017-04-06 DIAGNOSIS — F419 Anxiety disorder, unspecified: Secondary | ICD-10-CM | POA: Insufficient documentation

## 2017-04-06 DIAGNOSIS — Z7984 Long term (current) use of oral hypoglycemic drugs: Secondary | ICD-10-CM | POA: Insufficient documentation

## 2017-04-06 DIAGNOSIS — E7800 Pure hypercholesterolemia, unspecified: Secondary | ICD-10-CM | POA: Insufficient documentation

## 2017-04-06 DIAGNOSIS — R197 Diarrhea, unspecified: Secondary | ICD-10-CM

## 2017-04-06 DIAGNOSIS — K529 Noninfective gastroenteritis and colitis, unspecified: Secondary | ICD-10-CM | POA: Diagnosis not present

## 2017-04-06 DIAGNOSIS — K51918 Ulcerative colitis, unspecified with other complication: Secondary | ICD-10-CM | POA: Diagnosis not present

## 2017-04-06 DIAGNOSIS — M109 Gout, unspecified: Secondary | ICD-10-CM | POA: Insufficient documentation

## 2017-04-06 DIAGNOSIS — I10 Essential (primary) hypertension: Secondary | ICD-10-CM | POA: Diagnosis not present

## 2017-04-06 DIAGNOSIS — Z6839 Body mass index (BMI) 39.0-39.9, adult: Secondary | ICD-10-CM | POA: Insufficient documentation

## 2017-04-06 HISTORY — PX: BIOPSY: SHX5522

## 2017-04-06 HISTORY — PX: COLONOSCOPY WITH PROPOFOL: SHX5780

## 2017-04-06 LAB — GLUCOSE, CAPILLARY
Glucose-Capillary: 131 mg/dL — ABNORMAL HIGH (ref 65–99)
Glucose-Capillary: 119 mg/dL — ABNORMAL HIGH (ref 65–99)

## 2017-04-06 SURGERY — COLONOSCOPY WITH PROPOFOL
Anesthesia: Monitor Anesthesia Care

## 2017-04-06 MED ORDER — MIDAZOLAM HCL 2 MG/2ML IJ SOLN
1.0000 mg | INTRAMUSCULAR | Status: AC
  Administered 2017-04-06 (×2): 2 mg via INTRAVENOUS

## 2017-04-06 MED ORDER — GLYCOPYRROLATE 0.2 MG/ML IJ SOLN
INTRAMUSCULAR | Status: AC
  Filled 2017-04-06: qty 1

## 2017-04-06 MED ORDER — PROPOFOL 10 MG/ML IV BOLUS
INTRAVENOUS | Status: AC
  Filled 2017-04-06: qty 20

## 2017-04-06 MED ORDER — FENTANYL CITRATE (PF) 100 MCG/2ML IJ SOLN
25.0000 ug | Freq: Once | INTRAMUSCULAR | Status: AC
  Administered 2017-04-06: 25 ug via INTRAVENOUS

## 2017-04-06 MED ORDER — FENTANYL CITRATE (PF) 100 MCG/2ML IJ SOLN
INTRAMUSCULAR | Status: AC
  Filled 2017-04-06: qty 2

## 2017-04-06 MED ORDER — LACTATED RINGERS IV SOLN
INTRAVENOUS | Status: DC
  Administered 2017-04-06: 13:00:00 via INTRAVENOUS

## 2017-04-06 MED ORDER — PROPOFOL 500 MG/50ML IV EMUL
INTRAVENOUS | Status: DC | PRN
  Administered 2017-04-06: 125 ug/kg/min via INTRAVENOUS
  Administered 2017-04-06 (×2): via INTRAVENOUS

## 2017-04-06 MED ORDER — GLYCOPYRROLATE 0.2 MG/ML IJ SOLN
0.2000 mg | Freq: Once | INTRAMUSCULAR | Status: AC | PRN
  Administered 2017-04-06: 0.2 mg via INTRAVENOUS

## 2017-04-06 MED ORDER — MIDAZOLAM HCL 5 MG/5ML IJ SOLN
INTRAMUSCULAR | Status: DC | PRN
  Administered 2017-04-06: 2 mg via INTRAVENOUS

## 2017-04-06 MED ORDER — MIDAZOLAM HCL 2 MG/2ML IJ SOLN
INTRAMUSCULAR | Status: AC
  Filled 2017-04-06: qty 2

## 2017-04-06 MED ORDER — CHLORHEXIDINE GLUCONATE CLOTH 2 % EX PADS: 6.0000 | MEDICATED_PAD | Freq: Once | CUTANEOUS | Status: DC

## 2017-04-06 MED ORDER — MIDAZOLAM HCL 2 MG/2ML IJ SOLN
INTRAMUSCULAR | Status: AC
  Filled 2017-04-06: qty 4

## 2017-04-06 NOTE — Transfer of Care (Signed)
Immediate Anesthesia Transfer of Care Note  Patient: Louis House  Procedure(s) Performed: Procedure(s) with comments: COLONOSCOPY WITH PROPOFOL (N/A) - 1:45 BIOPSY - colon  Patient Location: PACU  Anesthesia Type:MAC  Level of Consciousness: awake and patient cooperative  Airway & Oxygen Therapy: Patient Spontanous Breathing and Patient connected to face mask oxygen  Post-op Assessment: Report given to RN, Post -op Vital signs reviewed and stable and Patient moving all extremities  Post vital signs: Reviewed and stable  Last Vitals:  Vitals:   04/06/17 1230  Temp: 36.8 C    Last Pain:  Vitals:   04/06/17 1230  TempSrc: Oral      Patients Stated Pain Goal: 5 ([id number])  Complications: No apparent anesthesia complications

## 2017-04-06 NOTE — Op Note (Signed)
[facility] Patient Name: Louis House Procedure Date: 04/06/2017 1:23 PM MRN: [id number] Date of Birth: 1956-06-10 Attending MD: Hildred Laser , MD CSN: [id number] Age: 61 Admit Type: Outpatient Procedure:                Colonoscopy Indications:              Clinically significant diarrhea of unexplained                            origin Providers:                Hildred Laser, MD, Otis Peak B. Sharon Seller, RN, Randa Spike, Technician Referring MD:             Stoney Bang, MD Medicines:                Propofol per Anesthesia Complications:            No immediate complications. Estimated Blood Loss:     Estimated blood loss was minimal. Procedure:                Pre-Anesthesia Assessment:                           - Prior to the procedure, a History and Physical                            was performed, and patient medications and                            allergies were reviewed. The patient's tolerance of                            previous anesthesia was also reviewed. The risks                            and benefits of the procedure and the sedation                            options and risks were discussed with the patient.                            All questions were answered, and informed consent                            was obtained. Prior Anticoagulants: The patient                            last took previous NSAID medication 1 day prior to                            the procedure. ASA Grade Assessment: III - A  patient with severe systemic disease. After                            reviewing the risks and benefits, the patient was                            deemed in satisfactory condition to undergo the                            procedure.                           After obtaining informed consent, the colonoscope                            was passed under direct vision. Throughout the          procedure, the patient's blood pressure, pulse, and                            oxygen saturations were monitored continuously. The                            EC-[id number] ([id number]) scope was introduced through                            the and advanced to the the cecum, identified by                            appendiceal orifice and ileocecal valve. The                            colonoscopy was performed without difficulty. The                            patient tolerated the procedure well. The quality                            of the bowel preparation was adequate to identify                            polyps. The ileocecal valve, appendiceal orifice,                            and rectum were photographed. Scope In: 1:57:28 PM Scope Out: 2:24:03 PM Scope Withdrawal Time: 0 hours 19 minutes 14 seconds  Total Procedure Duration: 0 hours 26 minutes 35 seconds  Findings:      The perianal and digital rectal examinations were normal.      An area of mildly congested mucosa was found in the ascending colon.       This was biopsied with a cold forceps for histology. The pathology       specimen was placed into Bottle Number 1.      The rectum, sigmoid colon, descending colon, splenic flexure, transverse       colon and hepatic flexure appeared  normal. Random biopsies were taken       with a cold forceps for histology. Biopsies were taken with a cold       forceps for histology. The pathology specimen was placed into Bottle       Number 2.      Multiple medium-mouthed diverticula were found in the sigmoid colon.      The retroflexed view of the distal rectum and anal verge was normal and       showed no anal or rectal abnormalities. Impression:               - Congested mucosa in the ascending colon. Biopsied.                           - The rectum, sigmoid colon, descending colon,                            splenic flexure, transverse colon and hepatic                             flexure are normal. Biopsied. Moderate Sedation:      Per Anesthesia Care Recommendation:           - Patient has a contact number available for                            emergencies. The signs and symptoms of potential                            delayed complications were discussed with the                            patient. Return to normal activities tomorrow.                            Written discharge instructions were provided to the                            patient.                           - High fiber diet today.                           - Continue present medications.                           - Await pathology results.                           - Repeat colonoscopy in 10 years for screening                            purposes. Procedure Code(s):        --- Professional ---                           [phone], Colonoscopy, flexible; with biopsy, single  or multiple Diagnosis Code(s):        --- Professional ---                           K63.89, Other specified diseases of intestine                           R19.7, Diarrhea, unspecified CPT copyright 2016 American Medical Association. All rights reserved. The codes documented in this report are preliminary and upon coder review may  be revised to meet current compliance requirements. Hildred Laser, MD Hildred Laser, MD 04/06/2017 2:36:20 PM This report has been signed electronically. Number of Addenda: 0

## 2017-04-06 NOTE — H&P (Signed)
Louis House is an 61 y.o. male.   Chief Complaint: Patient is here for colonoscopy. HPI: Patient is 61 year old Caucasian male whose been having diarrhea for about 6 months. Diarrhea started after he took 2 courses of antibiotics for URI and middle ear infection. Stool studies significant for moderate growth of staph aureus. He was treated with Bactrim DS. He felt better but bowel movements have not returned back to his baseline which used to be on a day. He had follow-up testing with GI pathogen panel and is negative. He also had abdominopelvic CT which did not show any changes of colitis or wall thickening. He also has discovered that he has lactose intolerance. He was tried on dicyclomine but cannot tell any difference. He has lost about 20 pounds this year. Some of his weight loss is voluntary. Last colonoscopy was in 2009 revealing left-sided diverticulosis. Family history is negative for CRC or IBD.  Past Medical History:  Diagnosis Date  . Anxiety   . Diabetes ([place]) 01/12/2017  .  Obesity.  02/21/2017  . Essential hypertension, benign 01/12/2017  . Gout   . High cholesterol 01/12/2017  . Neuropathy   . Vertigo        Obesity.  Past Surgical History:  Procedure Laterality Date  . bone spurs    . kneee arthroscopy     both knees  . Left shoulder surgery from injury    . Umblical hernia      History reviewed. No pertinent family history. Social History:  reports that he has never smoked. He has never used smokeless tobacco. He reports that he does not drink alcohol or use drugs.  Allergies: No Known Allergies  Medications Prior to Admission  Medication Sig Dispense Refill  . allopurinol (ZYLOPRIM) 300 MG tablet Take 300 mg by mouth daily.    Marland Kitchen atorvastatin (LIPITOR) 40 MG tablet Take 40 mg by mouth daily.    . benazepril (LOTENSIN) 40 MG tablet Take 40 mg by mouth daily.  0  . Cyanocobalamin (VITAMIN B-12) 2500 MCG SUBL Place 2,500 mcg under the tongue daily.    . diazepam  (VALIUM) 10 MG tablet Take 10 mg by mouth at bedtime.     . diclofenac (VOLTAREN) 75 MG EC tablet Take 75 mg by mouth 2 (two) times daily.    Marland Kitchen dicyclomine (BENTYL) 10 MG capsule Take 1 capsule (10 mg total) by mouth 3 (three) times daily before meals. 90 capsule 5  . DULoxetine (CYMBALTA) 30 MG capsule Take 30 mg by mouth at bedtime.     . fluticasone (FLONASE) 50 MCG/ACT nasal spray Place 1 spray into both nostrils daily.    Marland Kitchen gabapentin (NEURONTIN) 400 MG capsule Take 400 mg by mouth 2 (two) times daily as needed (scheduled at bedtime & if needed during the day for pain.).     Marland Kitchen HYDROcodone-acetaminophen (NORCO) 7.5-325 MG tablet Take 1 tablet by mouth every 6 (six) hours as needed for moderate pain.    Marland Kitchen L-Lysine 1000 MG TABS Take 1,000 mg by mouth daily as needed (for canker sores.).    Marland Kitchen loperamide (IMODIUM) 2 MG capsule Take by mouth as needed for diarrhea or loose stools.    Marland Kitchen loratadine (CLARITIN) 10 MG tablet Take 10 mg by mouth daily.    . meclizine (ANTIVERT) 25 MG tablet Take 25 mg by mouth 3 (three) times daily as needed for dizziness (for flare up of inner ear issues.).     Marland Kitchen metFORMIN (GLUCOPHAGE) 500 MG tablet  Take 500 mg by mouth 2 (two) times daily with a meal. Morning & in the evening    . metoprolol succinate (TOPROL-XL) 100 MG 24 hr tablet Take 100 mg by mouth daily. Take with or immediately following a meal.    . Omega-3 Fatty Acids (FISH OIL) 1200 MG CAPS Take 2,400 mg by mouth daily.    Marland Kitchen OVER THE COUNTER MEDICATION Take 1-2 capsules by mouth daily. HYDROXYCUT    . tizanidine (ZANAFLEX) 2 MG capsule Take 2 mg by mouth 3 (three) times daily as needed for muscle spasms.     . vitamin C (ASCORBIC ACID) 500 MG tablet Take 500 mg by mouth daily.      Results for orders placed or performed during the hospital encounter of 04/06/17 (from the past 48 hour(s))  Glucose, capillary     Status: Abnormal   Collection Time: 04/06/17 12:43 PM  Result Value Ref Range    Glucose-Capillary 131 (H) 65 - 99 mg/dL   No results found.  ROS  Temperature 98.3 F (36.8 C), temperature source Oral. Physical Exam  Constitutional:  Well-developed obese Caucasian male in NAD.  HENT:  Mouth/Throat: Oropharynx is clear and moist.  Eyes: Conjunctivae are normal. No scleral icterus.  Neck: No thyromegaly present.  Cardiovascular: Normal rate and regular rhythm.  Gallop: faint systolic murmur at LLSB.   Murmur heard. Respiratory: Effort normal and breath sounds normal.  GI:  Abdomen is full. Bowel sounds are normal. No bruit noted. On palpation is soft and nontender without organomegaly or masses.  Musculoskeletal: He exhibits no edema.  Lymphadenopathy:    He has no cervical adenopathy.  Neurological: He is alert.  Skin: Skin is warm and dry.     Assessment/Plan Unexplained diarrhea. Diagnostic colonoscopy under monitored anesthesia care.  Hildred Laser, MD 04/06/2017, 12:53 PM

## 2017-04-06 NOTE — Anesthesia Preprocedure Evaluation (Signed)
Anesthesia Evaluation  Patient identified by MRN, date of birth, ID band Patient awake    Reviewed: Allergy & Precautions, NPO status , Patient's Chart, lab work & pertinent test results, reviewed documented beta blocker date and time   Airway Mallampati: II  TM Distance: >3 FB Neck ROM: Full    Dental  (+) Teeth Intact   Pulmonary neg pulmonary ROS,    breath sounds clear to auscultation       Cardiovascular hypertension, Pt. on medications and Pt. on home beta blockers  Rhythm:Regular Rate:Normal     Neuro/Psych PSYCHIATRIC DISORDERS Anxiety  Neuromuscular disease    GI/Hepatic   Endo/Other  diabetes, Type 2Morbid obesity  Renal/GU      Musculoskeletal   Abdominal   Peds  Hematology   Anesthesia Other Findings   Reproductive/Obstetrics                             Anesthesia Physical Anesthesia Plan  ASA: III  Anesthesia Plan: MAC   Post-op Pain Management:    Induction: Intravenous  PONV Risk Score and Plan:   Airway Management Planned: Simple Face Mask  Additional Equipment:   Intra-op Plan:   Post-operative Plan:   Informed Consent: I have reviewed the patients History and Physical, chart, labs and discussed the procedure including the risks, benefits and alternatives for the proposed anesthesia with the patient or authorized representative who has indicated his/her understanding and acceptance.     Plan Discussed with:   Anesthesia Plan Comments:         Anesthesia Quick Evaluation

## 2017-04-06 NOTE — Discharge Instructions (Addendum)
High-Fiber Diet Fiber, also called dietary fiber, is a type of carbohydrate found in fruits, vegetables, whole grains, and beans. A high-fiber diet can have many health benefits. Your health care provider may recommend a high-fiber diet to help:  Prevent constipation. Fiber can make your bowel movements more regular.  Lower your cholesterol.  Relieve hemorrhoids, uncomplicated diverticulosis, or irritable bowel syndrome.  Prevent overeating as part of a weight-loss plan.  Prevent heart disease, type 2 diabetes, and certain cancers.  What is my plan? The recommended daily intake of fiber includes:  38 grams for men under age 61.  50 grams for men over age 61.  8 grams for women under age 61.  71 grams for women over age 61.  You can get the recommended daily intake of dietary fiber by eating a variety of fruits, vegetables, grains, and beans. Your health care provider may also recommend a fiber supplement if it is not possible to get enough fiber through your diet. What do I need to know about a high-fiber diet?  Fiber supplements have not been widely studied for their effectiveness, so it is better to get fiber through food sources.  Always check the fiber content on thenutrition facts label of any prepackaged food. Look for foods that contain at least 5 grams of fiber per serving.  Ask your dietitian if you have questions about specific foods that are related to your condition, especially if those foods are not listed in the following section.  Increase your daily fiber consumption gradually. Increasing your intake of dietary fiber too quickly may cause bloating, cramping, or gas.  Drink plenty of water. Water helps you to digest fiber. What foods can I eat? Grains Whole-grain breads. Multigrain cereal. Oats and oatmeal. Brown rice. Barley. Bulgur wheat. [place]. Bran muffins. Popcorn. Rye wafer crackers. Vegetables Sweet potatoes. Spinach. Kale. Artichokes. Cabbage.  Broccoli. Green peas. Carrots. Squash. Fruits Berries. Pears. Apples. Oranges. Avocados. Prunes and raisins. Dried figs. Meats and Other Protein Sources [place], kidney, pinto, and soy beans. Split peas. Lentils. Nuts and seeds. Dairy Fiber-fortified yogurt. Beverages Fiber-fortified soy milk. Fiber-fortified orange juice. Other Fiber bars. The items listed above may not be a complete list of recommended foods or beverages. Contact your dietitian for more options. What foods are not recommended? Grains White bread. Pasta made with refined flour. White rice. Vegetables Fried potatoes. Canned vegetables. Well-cooked vegetables. Fruits Fruit juice. Cooked, strained fruit. Meats and Other Protein Sources Fatty cuts of meat. Fried Sales executive or fried fish. Dairy Milk. Yogurt. Cream cheese. Sour cream. Beverages Soft drinks. Other Cakes and pastries. Butter and oils. The items listed above may not be a complete list of foods and beverages to avoid. Contact your dietitian for more information. What are some tips for including high-fiber foods in my diet?  Eat a wide variety of high-fiber foods.  Make sure that half of all grains consumed each day are whole grains.  Replace breads and cereals made from refined flour or white flour with whole-grain breads and cereals.  Replace white rice with brown rice, bulgur wheat, or millet.  Start the day with a breakfast that is high in fiber, such as a cereal that contains at least 5 grams of fiber per serving.  Use beans in place of meat in soups, salads, or pasta.  Eat high-fiber snacks, such as berries, raw vegetables, nuts, or popcorn. This information is not intended to replace advice given to you by your health care provider. Make sure you discuss any  questions you have with your health care provider. Document Released: 08/28/2005 Document Revised: 02/03/2016 Document Reviewed: 02/10/2014 Elsevier Interactive Patient Education  2017  [place]. Colonoscopy, Adult, Care After This sheet gives you information about how to care for yourself after your procedure. Your doctor may also give you more specific instructions. If you have problems or questions, call your doctor. Follow these instructions at home: General instructions   For the first 24 hours after the procedure: ? Do not drive or use machinery. ? Do not sign important documents. ? Do not drink alcohol. ? Do your daily activities more slowly than normal. ? Eat foods that are soft and easy to digest. ? Rest often.  Take over-the-counter or prescription medicines only as told by your doctor.  It is up to you to get the results of your procedure. Ask your doctor, or the department performing the procedure, when your results will be ready. To help cramping and bloating:  Try walking around.  Put heat on your belly (abdomen) as told by your doctor. Use a heat source that your doctor recommends, such as a moist heat pack or a heating pad. ? Put a towel between your skin and the heat source. ? Leave the heat on for 20-30 minutes. ? Remove the heat if your skin turns bright red. This is especially important if you cannot feel pain, heat, or cold. You can get burned. Eating and drinking  Drink enough fluid to keep your pee (urine) clear or pale yellow.  Return to your normal diet as told by your doctor. Avoid heavy or fried foods that are hard to digest.  Avoid drinking alcohol for as long as told by your doctor. Contact a doctor if:  You have blood in your poop (stool) 2-3 days after the procedure. Get help right away if:  You have more than a small amount of blood in your poop.  You see large clumps of tissue (blood clots) in your poop.  Your belly is swollen.  You feel sick to your stomach (nauseous).  You throw up (vomit).  You have a fever.  You have belly pain that gets worse, and medicine does not help your pain. This information is not  intended to replace advice given to you by your health care provider. Make sure you discuss any questions you have with your health care provider. Document Released: 09/30/2010 Document Revised: 05/22/2016 Document Reviewed: 05/22/2016 Elsevier Interactive Patient Education  2017 Short usual medications. High fiber diet. No driving for 24 hours. Physician will call with biopsy results next week.

## 2017-04-09 ENCOUNTER — Encounter (HOSPITAL_COMMUNITY): Payer: Self-pay | Admitting: Internal Medicine

## 2017-04-09 NOTE — Anesthesia Postprocedure Evaluation (Signed)
Anesthesia Post Note  Patient: Louis House  Procedure(s) Performed: Procedure(s) (LRB): COLONOSCOPY WITH PROPOFOL (N/A) BIOPSY  Patient location during evaluation: PACU Anesthesia Type: MAC Level of consciousness: awake and alert Pain management: satisfactory to patient Vital Signs Assessment: post-procedure vital signs reviewed and stable Respiratory status: spontaneous breathing Cardiovascular status: stable Postop Assessment: no signs of nausea or vomiting Anesthetic complications: no Comments: Late entry T. Nakenya Theall CRNA 04/09/2017 0754     Last Vitals:  Vitals:   04/06/17 1442 04/06/17 1457  BP:  135/90  Pulse: 71 70  Resp: 15 16  Temp:  37 C    Last Pain:  Vitals:   04/06/17 1457  TempSrc: Oral                 Quaid Yeakle

## 2017-04-13 ENCOUNTER — Other Ambulatory Visit (INDEPENDENT_AMBULATORY_CARE_PROVIDER_SITE_OTHER): Payer: Self-pay | Admitting: Internal Medicine

## 2017-04-13 DIAGNOSIS — I10 Essential (primary) hypertension: Secondary | ICD-10-CM | POA: Diagnosis not present

## 2017-04-13 DIAGNOSIS — E1165 Type 2 diabetes mellitus with hyperglycemia: Secondary | ICD-10-CM | POA: Diagnosis not present

## 2017-04-13 MED ORDER — MESALAMINE 400 MG PO CPDR: 800.0000 mg | DELAYED_RELEASE_CAPSULE | Freq: Two times a day (BID) | ORAL | 5 refills | Status: DC

## 2017-04-19 ENCOUNTER — Telehealth (INDEPENDENT_AMBULATORY_CARE_PROVIDER_SITE_OTHER): Payer: Self-pay | Admitting: *Deleted

## 2017-04-19 NOTE — Telephone Encounter (Signed)
Patient's insurance denied Del[place]icol. They recommend the following: Apriso , Lialda , Balsalazide , Sulfasalazine.  Per Dr.Rehman the patient may try Apriso - Patient is to take 4 per day. 1 month 5 refills. This was called to [facility].

## 2017-04-30 DIAGNOSIS — Z79899 Other long term (current) drug therapy: Secondary | ICD-10-CM | POA: Diagnosis not present

## 2017-04-30 DIAGNOSIS — R252 Cramp and spasm: Secondary | ICD-10-CM | POA: Diagnosis not present

## 2017-04-30 DIAGNOSIS — M545 Low back pain: Secondary | ICD-10-CM | POA: Diagnosis not present

## 2017-04-30 DIAGNOSIS — M519 Unspecified thoracic, thoracolumbar and lumbosacral intervertebral disc disorder: Secondary | ICD-10-CM | POA: Diagnosis not present

## 2017-04-30 DIAGNOSIS — Z79891 Long term (current) use of opiate analgesic: Secondary | ICD-10-CM | POA: Diagnosis not present

## 2017-04-30 DIAGNOSIS — M79673 Pain in unspecified foot: Secondary | ICD-10-CM | POA: Diagnosis not present

## 2017-04-30 DIAGNOSIS — E1140 Type 2 diabetes mellitus with diabetic neuropathy, unspecified: Secondary | ICD-10-CM | POA: Diagnosis not present

## 2017-05-04 ENCOUNTER — Encounter (INDEPENDENT_AMBULATORY_CARE_PROVIDER_SITE_OTHER): Payer: Self-pay | Admitting: Internal Medicine

## 2017-05-04 NOTE — Progress Notes (Signed)
Patient was given an appointment for 06/15/17 at 9:30am with Deberah Castle, NP.  A letter was mailed to the patient.

## 2017-05-29 DIAGNOSIS — Z1389 Encounter for screening for other disorder: Secondary | ICD-10-CM | POA: Diagnosis not present

## 2017-05-29 DIAGNOSIS — M1009 Idiopathic gout, multiple sites: Secondary | ICD-10-CM | POA: Diagnosis not present

## 2017-05-29 DIAGNOSIS — I10 Essential (primary) hypertension: Secondary | ICD-10-CM | POA: Diagnosis not present

## 2017-05-29 DIAGNOSIS — E119 Type 2 diabetes mellitus without complications: Secondary | ICD-10-CM | POA: Diagnosis not present

## 2017-05-29 DIAGNOSIS — Z0000 Encounter for general adult medical examination without abnormal findings: Secondary | ICD-10-CM | POA: Diagnosis not present

## 2017-06-07 DIAGNOSIS — G4733 Obstructive sleep apnea (adult) (pediatric): Secondary | ICD-10-CM | POA: Diagnosis not present

## 2017-06-15 ENCOUNTER — Encounter (INDEPENDENT_AMBULATORY_CARE_PROVIDER_SITE_OTHER): Payer: Self-pay

## 2017-06-15 ENCOUNTER — Encounter (INDEPENDENT_AMBULATORY_CARE_PROVIDER_SITE_OTHER): Payer: Self-pay | Admitting: Internal Medicine

## 2017-06-15 ENCOUNTER — Ambulatory Visit (INDEPENDENT_AMBULATORY_CARE_PROVIDER_SITE_OTHER): Payer: Medicare Other | Admitting: Internal Medicine

## 2017-06-15 DIAGNOSIS — K529 Noninfective gastroenteritis and colitis, unspecified: Secondary | ICD-10-CM | POA: Insufficient documentation

## 2017-06-15 LAB — CBC WITH DIFFERENTIAL/PLATELET
BASOS ABS: 108 {cells}/uL (ref 0–200)
BASOS PCT: 1.1 %
EOS ABS: 402 {cells}/uL (ref 15–500)
Eosinophils Relative: 4.1 %
HEMATOCRIT: 37.8 % — AB (ref 38.5–50.0)
HEMOGLOBIN: 12.4 g/dL — AB (ref 13.2–17.1)
LYMPHS ABS: 1872 {cells}/uL (ref 850–3900)
MCH: 27.9 pg (ref 27.0–33.0)
MCHC: 32.8 g/dL (ref 32.0–36.0)
MCV: 85.1 fL (ref 80.0–100.0)
MPV: 9.7 fL (ref 7.5–12.5)
Monocytes Relative: 5.8 %
NEUTROS ABS: 6850 {cells}/uL (ref 1500–7800)
Neutrophils Relative %: 69.9 %
Platelets: 438 10*3/uL — ABNORMAL HIGH (ref 140–400)
RBC: 4.44 10*6/uL (ref 4.20–5.80)
RDW: 13.2 % (ref 11.0–15.0)
Total Lymphocyte: 19.1 %
WBC: 9.8 10*3/uL (ref 3.8–10.8)
WBCMIX: 568 {cells}/uL (ref 200–950)

## 2017-06-15 LAB — SEDIMENTATION RATE: Sed Rate: 11 mm/h (ref 0–20)

## 2017-06-15 NOTE — Patient Instructions (Addendum)
Contine the Apriso (4 tabs a day).  OV in 3 months. Colitis Colitis is inflammation of the colon. Colitis may last a short time (acute) or it may last a long time (chronic). What are the causes? This condition may be caused by:  Viruses.  Bacteria.  Reactions to medicine.  Certain autoimmune diseases, such as Crohn disease or ulcerative colitis.  What are the signs or symptoms? Symptoms of this condition include:  Diarrhea.  Passing bloody or tarry stool.  Pain.  Fever.  Vomiting.  Tiredness (fatigue).  Weight loss.  Bloating.  Sudden increase in abdominal pain.  Having fewer bowel movements than usual.  How is this diagnosed? This condition is diagnosed with a stool test or a blood test. You may also have other tests, including X-rays, a CT scan, or a colonoscopy. How is this treated? Treatment may include:  Resting the bowel. This involves not eating or drinking for a period of time.  Fluids that are given through an IV tube.  Medicine for pain and diarrhea.  Antibiotic medicines.  Cortisone medicines.  Surgery.  Follow these instructions at home: Eating and drinking  Follow instructions from your health care provider about eating or drinking restrictions.  Drink enough fluid to keep your urine clear or pale yellow.  Work with a dietitian to determine which foods cause your condition to flare up.  Avoid foods that cause flare-ups.  Eat a well-balanced diet. Medicines  Take over-the-counter and prescription medicines only as told by your health care provider.  If you were prescribed an antibiotic medicine, take it as told by your health care provider. Do not stop taking the antibiotic even if you start to feel better. General instructions  Keep all follow-up visits as told by your health care provider. This is important. Contact a health care provider if:  Your symptoms do not go away.  You develop new symptoms. Get help right away  if:  You have a fever that does not go away with treatment.  You develop chills.  You have extreme weakness, fainting, or dehydration.  You have repeated vomiting.  You develop severe pain in your abdomen.  You pass bloody or tarry stool. This information is not intended to replace advice given to you by your health care provider. Make sure you discuss any questions you have with your health care provider. Document Released: 10/05/2004 Document Revised: 02/03/2016 Document Reviewed: 12/21/2014 Elsevier Interactive Patient Education  [employer].

## 2017-06-15 NOTE — Progress Notes (Signed)
Subjective:    Patient ID: Louis House, male    DOB: 1955/10/21, 61 y.o.   MRN: [id number]  HPI Here today for f/u. Underwent a colonoscopy in July of this year. Biopsy revealed acute colitis, possible IBE. He was started on Apriso .383m (4 a day). He was advised to remain on a low fat diet. His appetite is good. He is trying to lose weight. He is having a BM after every meal.  His stools are better now somewhat. No melena or BRRB. The urgency is not as bad.     04/06/2017 Colonoscopy: diarrhea.   Impression:               - Congested mucosa in the ascending colon. Biopsied.                           - The rectum, sigmoid colon, descending colon,                            splenic flexure, transverse colon and hepatic                            flexure are normal. Biopsied.  Biopsy results reviewed with patient. Biopsy shows changes of acute colitis possibly IBD. Will treat with mesalamine atrial milligrams by mouth twice a day. Prescription sent to patient's pharmacy. Office visit in 8 weeks. Report to PCP.   Review of Systems Past Medical History:  Diagnosis Date  . Anxiety   . Diabetes ([place] 01/12/2017  . Diarrhea 02/21/2017  . Essential hypertension, benign 01/12/2017  . Gout   . High cholesterol 01/12/2017  . Neuropathy   . Vertigo     Past Surgical History:  Procedure Laterality Date  . BIOPSY  04/06/2017   Procedure: BIOPSY;  Surgeon: RRogene Houston MD;  Location: [facility];  Service: Endoscopy;;  colon  . bone spurs    . COLONOSCOPY WITH PROPOFOL N/A 04/06/2017   Procedure: COLONOSCOPY WITH PROPOFOL;  Surgeon: RRogene Houston MD;  Location: [facility];  Service: Endoscopy;  Laterality: N/A;  1:45  . kneee arthroscopy     both knees  . Left shoulder surgery from injury    . Umblical hernia      No Known Allergies  Current Outpatient Prescriptions on File Prior to Visit  Medication Sig Dispense Refill  . allopurinol (ZYLOPRIM) 300 MG tablet Take  300 mg by mouth daily.    .Marland Kitchenatorvastatin (LIPITOR) 40 MG tablet Take 40 mg by mouth daily.    . benazepril (LOTENSIN) 40 MG tablet Take 40 mg by mouth daily.  0  . diazepam (VALIUM) 10 MG tablet Take 10 mg by mouth at bedtime.     . diclofenac (VOLTAREN) 75 MG EC tablet Take 75 mg by mouth 2 (two) times daily.    .Marland Kitchendicyclomine (BENTYL) 10 MG capsule Take 1 capsule (10 mg total) by mouth 3 (three) times daily before meals. 90 capsule 5  . DULoxetine (CYMBALTA) 30 MG capsule Take 30 mg by mouth at bedtime.     . gabapentin (NEURONTIN) 400 MG capsule Take 400 mg by mouth 2 (two) times daily as needed (scheduled at bedtime & if needed during the day for pain.).     .Marland KitchenHYDROcodone-acetaminophen (NORCO) 7.5-325 MG tablet Take 1 tablet by mouth every 6 (six) hours as needed  for moderate pain.    Marland Kitchen loratadine (CLARITIN) 10 MG tablet Take 10 mg by mouth daily.    . meclizine (ANTIVERT) 25 MG tablet Take 25 mg by mouth 3 (three) times daily as needed for dizziness (for flare up of inner ear issues.).     Marland Kitchen metFORMIN (GLUCOPHAGE) 500 MG tablet Take 500 mg by mouth 2 (two) times daily with a meal. Morning & in the evening    . metoprolol succinate (TOPROL-XL) 100 MG 24 hr tablet Take 100 mg by mouth daily. Take with or immediately following a meal.    . Omega-3 Fatty Acids (FISH OIL) 1200 MG CAPS Take 2,400 mg by mouth daily.    . tizanidine (ZANAFLEX) 2 MG capsule Take 2 mg by mouth 3 (three) times daily as needed for muscle spasms.     . vitamin C (ASCORBIC ACID) 500 MG tablet Take 500 mg by mouth daily.    . fluticasone (FLONASE) 50 MCG/ACT nasal spray Place 1 spray into both nostrils daily.     No current facility-administered medications on file prior to visit.         Objective:   Physical Exam Blood pressure 120/80, pulse 64, temperature (!) 97.5 F (36.4 C), height 6' 1"  (1.854 m), weight 288 lb 11.2 oz (131 kg). Alert and oriented. Skin warm and dry. Oral mucosa is moist.   . Sclera anicteric,  conjunctivae is pink. Thyroid not enlarged. No cervical lymphadenopathy. Lungs clear. Heart regular rate and rhythm.  Abdomen is soft. Bowel sounds are positive. No hepatomegaly. No abdominal masses felt. No tenderness.  No edema to lower extremities.           Assessment & Plan:  IBD. He seems to be doing well. Will get a CBC and sedrate. OV in 3 months.

## 2017-06-25 DIAGNOSIS — G4733 Obstructive sleep apnea (adult) (pediatric): Secondary | ICD-10-CM | POA: Diagnosis not present

## 2017-06-27 DIAGNOSIS — R252 Cramp and spasm: Secondary | ICD-10-CM | POA: Diagnosis not present

## 2017-06-27 DIAGNOSIS — Z79899 Other long term (current) drug therapy: Secondary | ICD-10-CM | POA: Diagnosis not present

## 2017-06-27 DIAGNOSIS — M545 Low back pain: Secondary | ICD-10-CM | POA: Diagnosis not present

## 2017-06-27 DIAGNOSIS — M79673 Pain in unspecified foot: Secondary | ICD-10-CM | POA: Diagnosis not present

## 2017-08-27 DIAGNOSIS — M545 Low back pain: Secondary | ICD-10-CM | POA: Diagnosis not present

## 2017-08-27 DIAGNOSIS — G4733 Obstructive sleep apnea (adult) (pediatric): Secondary | ICD-10-CM | POA: Diagnosis not present

## 2017-08-27 DIAGNOSIS — R252 Cramp and spasm: Secondary | ICD-10-CM | POA: Diagnosis not present

## 2017-08-27 DIAGNOSIS — Z79891 Long term (current) use of opiate analgesic: Secondary | ICD-10-CM | POA: Diagnosis not present

## 2017-08-30 DIAGNOSIS — M1009 Idiopathic gout, multiple sites: Secondary | ICD-10-CM | POA: Diagnosis not present

## 2017-08-30 DIAGNOSIS — Z0000 Encounter for general adult medical examination without abnormal findings: Secondary | ICD-10-CM | POA: Diagnosis not present

## 2017-08-30 DIAGNOSIS — I10 Essential (primary) hypertension: Secondary | ICD-10-CM | POA: Diagnosis not present

## 2017-08-30 DIAGNOSIS — Z1389 Encounter for screening for other disorder: Secondary | ICD-10-CM | POA: Diagnosis not present

## 2017-08-30 DIAGNOSIS — E119 Type 2 diabetes mellitus without complications: Secondary | ICD-10-CM | POA: Diagnosis not present

## 2017-08-31 DIAGNOSIS — Z7984 Long term (current) use of oral hypoglycemic drugs: Secondary | ICD-10-CM | POA: Diagnosis not present

## 2017-08-31 DIAGNOSIS — E119 Type 2 diabetes mellitus without complications: Secondary | ICD-10-CM | POA: Diagnosis not present

## 2017-08-31 DIAGNOSIS — H5201 Hypermetropia, right eye: Secondary | ICD-10-CM | POA: Diagnosis not present

## 2017-08-31 DIAGNOSIS — H25813 Combined forms of age-related cataract, bilateral: Secondary | ICD-10-CM | POA: Diagnosis not present

## 2017-09-14 ENCOUNTER — Encounter (INDEPENDENT_AMBULATORY_CARE_PROVIDER_SITE_OTHER): Payer: Self-pay

## 2017-09-14 ENCOUNTER — Encounter (INDEPENDENT_AMBULATORY_CARE_PROVIDER_SITE_OTHER): Payer: Self-pay | Admitting: Internal Medicine

## 2017-09-14 ENCOUNTER — Ambulatory Visit (INDEPENDENT_AMBULATORY_CARE_PROVIDER_SITE_OTHER): Payer: Medicare Other | Admitting: Internal Medicine

## 2017-09-14 VITALS — BP 118/80 | HR 80 | Temp 98.0°F | Ht 73.0 in | Wt 281.3 lb

## 2017-09-14 DIAGNOSIS — R197 Diarrhea, unspecified: Secondary | ICD-10-CM | POA: Diagnosis not present

## 2017-09-14 DIAGNOSIS — K529 Noninfective gastroenteritis and colitis, unspecified: Secondary | ICD-10-CM

## 2017-09-14 MED ORDER — MESALAMINE ER 0.375 G PO CP24: 375.0000 mg | ORAL_CAPSULE | Freq: Every day | ORAL | 3 refills | Status: DC

## 2017-09-14 MED ORDER — DICYCLOMINE HCL 20 MG PO TABS: 20.0000 mg | ORAL_TABLET | Freq: Three times a day (TID) | ORAL | Status: AC

## 2017-09-14 NOTE — Progress Notes (Signed)
Subjective:    Patient ID: Louis House, male    DOB: 05-05-1956, 62 y.o.   MRN: [id number]  HPI Here today for f/u. He is concerned about the diet he needs to be on for GERD. He tells me he is eating [employer]. Eating low calorie foods. He is eating apple sauce and bread in the morning.  He is trying to lose weight.  If he goes out to eat and the [place], he will be "tore up".  His concern is his stools.  He says if he sneezes, he may become incontinent. He has for the last 2 weeks his stools have been loose and watery. When he does have a BM, he has a lot of flatus. He eats bananas daily.  He is having 4-5 stools a day He is on Apriso four caps a day. 0.317m, Underwent a colonoscopy in July of 2018 for diarrhea. Stool studies were negative.  Biopsy shows changes of acute colitis possibly IBD.  Takes Dicyclomine 163mTID.  CBC    Component Value Date/Time   WBC 9.8 06/15/2017 1002   RBC 4.44 06/15/2017 1002   HGB 12.4 (L) 06/15/2017 1002   HCT 37.8 (L) 06/15/2017 1002   PLT 438 (H) 06/15/2017 1002   MCV 85.1 06/15/2017 1002   MCH 27.9 06/15/2017 1002   MCHC 32.8 06/15/2017 1002   RDW 13.2 06/15/2017 1002   LYMPHSABS 1,872 06/15/2017 1002   MONOABS 0.6 04/03/2017 0908   EOSABS 402 06/15/2017 1002   BASOSABS 108 06/15/2017 1002   Erythrocyte Sedimentation Rate     Component Value Date/Time   ESRSEDRATE 11 06/15/2017 1002     Review of Systems Past Medical History:  Diagnosis Date  . Anxiety   . [place]  . Diarrhea 02/21/2017  . Essential hypertension, benign 01/12/2017  . Gout   . High cholesterol 01/12/2017  . Neuropathy   . Vertigo     Past Surgical History:  Procedure Laterality Date  . BIOPSY  04/06/2017   Procedure: BIOPSY;  Surgeon: ReRogene HoustonMD;  [facility];  Service: Endoscopy;;  colon  . bone spurs    . COLONOSCOPY WITH PROPOFOL N/A 04/06/2017   Procedure: COLONOSCOPY WITH PROPOFOL;  Surgeon:  ReRogene HoustonMD;  [facility];  Service: Endoscopy;  Laterality: N/A;  1:45  . kneee arthroscopy     both knees  . Left shoulder surgery from injury    . Umblical hernia      No Known Allergies  Current Outpatient Medications on File Prior to Visit  Medication Sig Dispense Refill  . allopurinol (ZYLOPRIM) 300 MG tablet Take 300 mg by mouth daily.    . Marland Kitchentorvastatin (LIPITOR) 40 MG tablet Take 40 mg by mouth daily.    . benazepril (LOTENSIN) 40 MG tablet Take 40 mg by mouth daily.  0  . chlorthalidone (HYGROTON) 25 MG tablet Take 25 mg by mouth daily.    . diazepam (VALIUM) 10 MG tablet Take 10 mg by mouth at bedtime.     . diclofenac (VOLTAREN) 75 MG EC tablet Take 75 mg by mouth 2 (two) times daily.    . Marland Kitchenicyclomine (BENTYL) 10 MG capsule Take 1 capsule (10 mg total) by mouth 3 (three) times daily before meals. 90 capsule 5  . DULoxetine (CYMBALTA) 30 MG capsule Take 30 mg by mouth at bedtime.     . fluticasone (FLONASE) 50 MCG/ACT nasal spray Place 1 spray into  both nostrils daily.    Marland Kitchen gabapentin (NEURONTIN) 400 MG capsule Take 400 mg by mouth 2 (two) times daily as needed (scheduled at bedtime & if needed during the day for pain.).     Marland Kitchen HYDROcodone-acetaminophen (NORCO) 7.5-325 MG tablet Take 1 tablet by mouth every 6 (six) hours as needed for moderate pain.    Marland Kitchen loratadine (CLARITIN) 10 MG tablet Take 10 mg by mouth daily.    . meclizine (ANTIVERT) 25 MG tablet Take 25 mg by mouth 3 (three) times daily as needed for dizziness (for flare up of inner ear issues.).     Marland Kitchen mesalamine (APRISO) 0.375 g 24 hr capsule Take 375 mg by mouth daily. Take 4 a day    . metFORMIN (GLUCOPHAGE) 500 MG tablet Take 500 mg by mouth 2 (two) times daily with a meal. Morning & in the evening    . metoprolol succinate (TOPROL-XL) 100 MG 24 hr tablet Take 100 mg by mouth daily. Take with or immediately following a meal.    . Omega-3 Fatty Acids (FISH OIL) 1200 MG CAPS Take 2,400 mg by mouth  daily.    . tamsulosin (FLOMAX) 0.4 MG CAPS capsule Take 0.4 mg by mouth.    . tizanidine (ZANAFLEX) 2 MG capsule Take 2 mg by mouth 3 (three) times daily as needed for muscle spasms.     . vitamin C (ASCORBIC ACID) 500 MG tablet Take 500 mg by mouth daily.     No current facility-administered medications on file prior to visit.         Objective:   Physical Exam Blood pressure 118/80, pulse 80, temperature 98 F (36.7 C), height 6' 1"  (1.854 m), weight 281 lb 4.8 oz (127.6 kg). Alert and oriented. Skin warm and dry. Oral mucosa is moist.   . Sclera anicteric, conjunctivae is pink. Thyroid not enlarged. No cervical lymphadenopathy. Lungs clear. Heart regular rate and rhythm.  Abdomen is soft. Bowel sounds are positive. No hepatomegaly. No abdominal masses felt. No tenderness.  No edema to lower extremities.          Assessment & Plan:  Diarrhea.  Am going to start him on Dicyclomine 1m QID. Stop the Dicyclomine 127mTID.  OV in 3 months

## 2017-09-14 NOTE — Patient Instructions (Signed)
Stop the Dicyclomine 10m Start the dicyclomine 242m Rx for Apriso sent to his pharmacy.

## 2017-09-27 DIAGNOSIS — R42 Dizziness and giddiness: Secondary | ICD-10-CM | POA: Diagnosis not present

## 2017-10-24 DIAGNOSIS — M545 Low back pain: Secondary | ICD-10-CM | POA: Diagnosis not present

## 2017-10-24 DIAGNOSIS — G4733 Obstructive sleep apnea (adult) (pediatric): Secondary | ICD-10-CM | POA: Diagnosis not present

## 2017-10-24 DIAGNOSIS — Z79891 Long term (current) use of opiate analgesic: Secondary | ICD-10-CM | POA: Diagnosis not present

## 2017-10-24 DIAGNOSIS — R252 Cramp and spasm: Secondary | ICD-10-CM | POA: Diagnosis not present

## 2017-11-29 DIAGNOSIS — J40 Bronchitis, not specified as acute or chronic: Secondary | ICD-10-CM | POA: Diagnosis not present

## 2017-11-29 DIAGNOSIS — R42 Dizziness and giddiness: Secondary | ICD-10-CM | POA: Diagnosis not present

## 2017-12-13 ENCOUNTER — Ambulatory Visit (INDEPENDENT_AMBULATORY_CARE_PROVIDER_SITE_OTHER): Payer: Medicare Other | Admitting: Internal Medicine

## 2017-12-13 ENCOUNTER — Encounter (INDEPENDENT_AMBULATORY_CARE_PROVIDER_SITE_OTHER): Payer: Self-pay | Admitting: Internal Medicine

## 2017-12-13 VITALS — BP 140/82 | HR 64 | Temp 97.4°F | Ht 73.0 in | Wt 286.0 lb

## 2017-12-13 DIAGNOSIS — K529 Noninfective gastroenteritis and colitis, unspecified: Secondary | ICD-10-CM | POA: Diagnosis not present

## 2017-12-13 NOTE — Progress Notes (Addendum)
Subjective:    Patient ID: Louis House, male    DOB: May 12, 1956, 62 y.o.   MRN: [id number]  HPI Here today for f/u.  Las seen in in January of this year.  He is on Apriso four caps a day. 0.380m, Underwent a colonoscopy in July of 2018 for diarrhea. Stool studies were negative.  Biopsy shows changes of acute colitis possibly IBD.  Takes Dicyclomine 244mTID. He tells me he is doing good. He says he just got over bronchitis.He was covered with Penicillin x 10 day. He says his BMs are better. He has more control of his BMs now. His BMs are mushy but do have some shape. He has a BM x 3 a day and sometimes 4 stools. He denies any abdominal pain.  His appetite is good. No weight loss.  Review of Systems Past Medical History:  Diagnosis Date  . Anxiety   . [place]  . Diarrhea 02/21/2017  . Essential hypertension, benign 01/12/2017  . Gout   . High cholesterol 01/12/2017  . Neuropathy   . Vertigo     Past Surgical History:  Procedure Laterality Date  . BIOPSY  04/06/2017   Procedure: BIOPSY;  Surgeon: ReRogene HoustonMD;  [facility];  Service: Endoscopy;;  colon  . bone spurs    . COLONOSCOPY WITH PROPOFOL N/A 04/06/2017   Procedure: COLONOSCOPY WITH PROPOFOL;  Surgeon: ReRogene HoustonMD;  [facility];  Service: Endoscopy;  Laterality: N/A;  1:45  . kneee arthroscopy     both knees  . Left shoulder surgery from injury    . Umblical hernia      No Known Allergies  Current Outpatient Medications on File Prior to Visit  Medication Sig Dispense Refill  . allopurinol (ZYLOPRIM) 300 MG tablet Take 300 mg by mouth daily.    . Marland Kitchentorvastatin (LIPITOR) 40 MG tablet Take 40 mg by mouth daily.    . benazepril (LOTENSIN) 40 MG tablet Take 40 mg by mouth daily.  0  . chlorthalidone (HYGROTON) 25 MG tablet Take 25 mg by mouth daily.    . diazepam (VALIUM) 10 MG tablet Take 10 mg by mouth at bedtime.     . diclofenac (VOLTAREN) 75 MG EC tablet  Take 75 mg by mouth 2 (two) times daily.    . Marland Kitchenicyclomine (BENTYL) 20 MG tablet Take 20 mg by mouth 3 (three) times daily before meals.    . DULoxetine (CYMBALTA) 30 MG capsule Take 30 mg by mouth at bedtime.     . fluticasone (FLONASE) 50 MCG/ACT nasal spray Place 1 spray into both nostrils daily.    . Marland Kitchenabapentin (NEURONTIN) 400 MG capsule Take 400 mg by mouth 2 (two) times daily as needed (scheduled at bedtime & if needed during the day for pain.).     . Marland KitchenYDROcodone-acetaminophen (NORCO) 7.5-325 MG tablet Take 1 tablet by mouth every 6 (six) hours as needed for moderate pain.    . Marland Kitchenoratadine (CLARITIN) 10 MG tablet Take 10 mg by mouth daily.    . meclizine (ANTIVERT) 25 MG tablet Take 25 mg by mouth 3 (three) times daily as needed for dizziness (for flare up of inner ear issues.).     . Marland Kitchenesalamine (APRISO) 0.375 g 24 hr capsule Take 1 capsule (0.375 g total) by mouth daily. Take 4 a day 120 capsule 3  . metFORMIN (GLUCOPHAGE) 500 MG tablet Take 500 mg by mouth 2 (two) times daily with a  meal. Morning & in the evening    . metoprolol succinate (TOPROL-XL) 100 MG 24 hr tablet Take 100 mg by mouth daily. Take with or immediately following a meal.    . Omega-3 Fatty Acids (FISH OIL) 1200 MG CAPS Take 2,400 mg by mouth daily.    . tamsulosin (FLOMAX) 0.4 MG CAPS capsule Take 0.4 mg by mouth.    . tizanidine (ZANAFLEX) 2 MG capsule Take 2 mg by mouth 3 (three) times daily as needed for muscle spasms.     . vitamin C (ASCORBIC ACID) 500 MG tablet Take 500 mg by mouth daily.     Current Facility-Administered Medications on File Prior to Visit  Medication Dose Route Frequency Provider Last Rate Last Dose  . dicyclomine (BENTYL) tablet 20 mg  20 mg Oral TID AC & HS Setzer, Terri L, NP            Objective:   Physical Exam Blood pressure 140/82, pulse 64, temperature (!) 97.4 F (36.3 C), height 6' 1"  (1.854 m), weight 286 lb (129.7 kg). Alert and oriented. Skin warm and dry. Oral mucosa is moist.    . Sclera anicteric, conjunctivae is pink. Thyroid not enlarged. No cervical lymphadenopathy. Lungs clear. Heart regular rate and rhythm.  Abdomen is soft. Bowel sounds are positive. No hepatomegaly. No abdominal masses felt. No tenderness.  No edema to lower extremities.   Abdomen is obese.        Assessment & Plan:  Diarrhea, IBD.  Colonoscopy in July of 2018 showed colitis, possibly IBD. Stools are better at this time. Have some form but are soft. Will get a CBC and CRP.

## 2017-12-13 NOTE — Patient Instructions (Addendum)
OV in 6 months.

## 2017-12-14 LAB — CBC WITH DIFFERENTIAL/PLATELET
BASOS ABS: 144 {cells}/uL (ref 0–200)
Basophils Relative: 1.4 %
Eosinophils Absolute: 330 cells/uL (ref 15–500)
Eosinophils Relative: 3.2 %
HCT: 37 % — ABNORMAL LOW (ref 38.5–50.0)
HEMOGLOBIN: 12.7 g/dL — AB (ref 13.2–17.1)
Lymphs Abs: 2606 cells/uL (ref 850–3900)
MCH: 29.4 pg (ref 27.0–33.0)
MCHC: 34.3 g/dL (ref 32.0–36.0)
MCV: 85.6 fL (ref 80.0–100.0)
MONOS PCT: 8.3 %
MPV: 9.2 fL (ref 7.5–12.5)
NEUTROS ABS: 6365 {cells}/uL (ref 1500–7800)
Neutrophils Relative %: 61.8 %
Platelets: 458 10*3/uL — ABNORMAL HIGH (ref 140–400)
RBC: 4.32 10*6/uL (ref 4.20–5.80)
RDW: 12.5 % (ref 11.0–15.0)
Total Lymphocyte: 25.3 %
WBC mixed population: 855 cells/uL (ref 200–950)
WBC: 10.3 10*3/uL (ref 3.8–10.8)

## 2017-12-14 LAB — C-REACTIVE PROTEIN: CRP: 9.4 mg/L — AB (ref <8.0)

## 2017-12-20 ENCOUNTER — Ambulatory Visit (INDEPENDENT_AMBULATORY_CARE_PROVIDER_SITE_OTHER): Payer: Medicare Other | Admitting: Otolaryngology

## 2017-12-20 DIAGNOSIS — H903 Sensorineural hearing loss, bilateral: Secondary | ICD-10-CM | POA: Diagnosis not present

## 2017-12-20 DIAGNOSIS — H9012 Conductive hearing loss, unilateral, left ear, with unrestricted hearing on the contralateral side: Secondary | ICD-10-CM

## 2017-12-20 DIAGNOSIS — H6982 Other specified disorders of Eustachian tube, left ear: Secondary | ICD-10-CM | POA: Diagnosis not present

## 2018-01-10 ENCOUNTER — Ambulatory Visit (INDEPENDENT_AMBULATORY_CARE_PROVIDER_SITE_OTHER): Payer: Medicare Other | Admitting: Otolaryngology

## 2018-01-21 DIAGNOSIS — G4733 Obstructive sleep apnea (adult) (pediatric): Secondary | ICD-10-CM | POA: Diagnosis not present

## 2018-01-21 DIAGNOSIS — M545 Low back pain: Secondary | ICD-10-CM | POA: Diagnosis not present

## 2018-01-21 DIAGNOSIS — Z79891 Long term (current) use of opiate analgesic: Secondary | ICD-10-CM | POA: Diagnosis not present

## 2018-01-21 DIAGNOSIS — R252 Cramp and spasm: Secondary | ICD-10-CM | POA: Diagnosis not present

## 2018-01-29 ENCOUNTER — Other Ambulatory Visit: Payer: Self-pay

## 2018-01-29 NOTE — Patient Outreach (Signed)
[facility]) Care Management  01/29/2018  Jameal Razzano [name] 24, 1956 [id number]   Medication Adherence call to IR:SWNIO [place] patient did not answer [place] said last time he pick up his medication was on April/2019 patient is due on Atorvastatin 40 mg and Metformin 500 mg under [place].  [place] Management Direct Dial [phone]  Fax [phone] [email or web address]

## 2018-03-05 DIAGNOSIS — R42 Dizziness and giddiness: Secondary | ICD-10-CM | POA: Diagnosis not present

## 2018-03-05 DIAGNOSIS — I10 Essential (primary) hypertension: Secondary | ICD-10-CM | POA: Diagnosis not present

## 2018-03-05 DIAGNOSIS — E119 Type 2 diabetes mellitus without complications: Secondary | ICD-10-CM | POA: Diagnosis not present

## 2018-03-05 DIAGNOSIS — Z0000 Encounter for general adult medical examination without abnormal findings: Secondary | ICD-10-CM | POA: Diagnosis not present

## 2018-03-05 DIAGNOSIS — J40 Bronchitis, not specified as acute or chronic: Secondary | ICD-10-CM | POA: Diagnosis not present

## 2018-03-05 DIAGNOSIS — Z125 Encounter for screening for malignant neoplasm of prostate: Secondary | ICD-10-CM | POA: Diagnosis not present

## 2018-03-05 DIAGNOSIS — E7849 Other hyperlipidemia: Secondary | ICD-10-CM | POA: Diagnosis not present

## 2018-03-10 ENCOUNTER — Other Ambulatory Visit (INDEPENDENT_AMBULATORY_CARE_PROVIDER_SITE_OTHER): Payer: Self-pay | Admitting: Internal Medicine

## 2018-03-25 DIAGNOSIS — H25812 Combined forms of age-related cataract, left eye: Secondary | ICD-10-CM | POA: Diagnosis not present

## 2018-04-11 NOTE — Patient Instructions (Signed)
Your procedure is scheduled on: 04/22/2018  Report to [facility] at  13   AM.  Call this number if you have problems the morning of surgery: [phone]   Do not eat food or drink liquids :After Midnight.      Take these medicines the morning of surgery with A SIP OF WATER: allopurinol, lotensin, hygroton, diclofenac, cymbalta, hydrocodone( if needed), neurontin, claritin, antivert (if needed),mesalamine, metoprolol, flomax, zanaflex (if needed).   Do not wear jewelry, make-up or nail polish.  Do not wear lotions, powders, or perfumes. You may wear deodorant.  Do not shave 48 hours prior to surgery.  Do not bring valuables to the hospital.  Contacts, dentures or bridgework may not be worn into surgery.  Leave suitcase in the car. After surgery it may be brought to your room.  For patients admitted to the hospital, checkout time is 11:00 AM the day of discharge.   Patients discharged the day of surgery will not be allowed to drive home.  :     Please read over the following fact sheets that you were given: Coughing and Deep Breathing, Surgical Site Infection Prevention, Anesthesia Post-op Instructions and Care and Recovery After Surgery    Cataract A cataract is a clouding of the lens of the eye. When a lens becomes cloudy, vision is reduced based on the degree and nature of the clouding. Many cataracts reduce vision to some degree. Some cataracts make people more near-sighted as they develop. Other cataracts increase glare. Cataracts that are ignored and become worse can sometimes look white. The white color can be seen through the pupil. CAUSES   Aging. However, cataracts may occur at any age, even in newborns.   Certain drugs.   Trauma to the eye.   Certain diseases such as diabetes.   Specific eye diseases such as chronic inflammation inside the eye or a sudden attack of a rare form of glaucoma.   Inherited or acquired medical problems.  SYMPTOMS   Gradual, progressive drop  in vision in the affected eye.   Severe, rapid visual loss. This most often happens when trauma is the cause.  DIAGNOSIS  To detect a cataract, an eye doctor examines the lens. Cataracts are best diagnosed with an exam of the eyes with the pupils enlarged (dilated) by drops.  TREATMENT  For an early cataract, vision may improve by using different eyeglasses or stronger lighting. If that does not help your vision, surgery is the only effective treatment. A cataract needs to be surgically removed when vision loss interferes with your everyday activities, such as driving, reading, or watching TV. A cataract may also have to be removed if it prevents examination or treatment of another eye problem. Surgery removes the cloudy lens and usually replaces it with a substitute lens (intraocular lens, IOL).  At a time when both you and your doctor agree, the cataract will be surgically removed. If you have cataracts in both eyes, only one is usually removed at a time. This allows the operated eye to heal and be out of danger from any possible problems after surgery (such as infection or poor wound healing). In rare cases, a cataract may be doing damage to your eye. In these cases, your caregiver may advise surgical removal right away. The vast majority of people who have cataract surgery have better vision afterward. HOME CARE INSTRUCTIONS  If you are not planning surgery, you may be asked to do the following:  Use different eyeglasses.  Use stronger or brighter lighting.   Ask your eye doctor about reducing your medicine dose or changing medicines if it is thought that a medicine caused your cataract. Changing medicines does not make the cataract go away on its own.   Become familiar with your surroundings. Poor vision can lead to injury. Avoid bumping into things on the affected side. You are at a higher risk for tripping or falling.   Exercise extreme care when driving or operating machinery.   Wear  sunglasses if you are sensitive to bright light or experiencing problems with glare.  SEEK IMMEDIATE MEDICAL CARE IF:   You have a worsening or sudden vision loss.   You notice redness, swelling, or increasing pain in the eye.   You have a fever.  Document Released: 08/28/2005 Document Revised: 08/17/2011 Document Reviewed: 04/21/2011 [facility] Patient Information 2012 [place].PATIENT INSTRUCTIONS POST-ANESTHESIA  IMMEDIATELY FOLLOWING SURGERY:  Do not drive or operate machinery for the first twenty four hours after surgery.  Do not make any important decisions for twenty four hours after surgery or while taking narcotic pain medications or sedatives.  If you develop intractable nausea and vomiting or a severe headache please notify your doctor immediately.  FOLLOW-UP:  Please make an appointment with your surgeon as instructed. You do not need to follow up with anesthesia unless specifically instructed to do so.  WOUND CARE INSTRUCTIONS (if applicable):  Keep a dry clean dressing on the anesthesia/puncture wound site if there is drainage.  Once the wound has quit draining you may leave it open to air.  Generally you should leave the bandage intact for twenty four hours unless there is drainage.  If the epidural site drains for more than 36-48 hours please call the anesthesia department.  QUESTIONS?:  Please feel free to call your physician or the hospital operator if you have any questions, and they will be happy to assist you.

## 2018-04-16 ENCOUNTER — Encounter (HOSPITAL_COMMUNITY): Payer: Self-pay

## 2018-04-16 ENCOUNTER — Encounter (HOSPITAL_COMMUNITY)
Admission: RE | Admit: 2018-04-16 | Discharge: 2018-04-16 | Disposition: A | Discharge status: Home / Self Care | Payer: Medicare Other | Source: Ambulatory Visit | Attending: Ophthalmology | Admitting: Ophthalmology

## 2018-04-16 DIAGNOSIS — Z01812 Encounter for preprocedural laboratory examination: Secondary | ICD-10-CM | POA: Insufficient documentation

## 2018-04-16 DIAGNOSIS — Z01810 Encounter for preprocedural cardiovascular examination: Secondary | ICD-10-CM | POA: Insufficient documentation

## 2018-04-16 DIAGNOSIS — R9431 Abnormal electrocardiogram [ECG] [EKG]: Secondary | ICD-10-CM | POA: Diagnosis not present

## 2018-04-16 HISTORY — DX: Sleep apnea, unspecified: G47.30

## 2018-04-16 LAB — CBC
HEMATOCRIT: 38.7 % — AB (ref 39.0–52.0)
HEMOGLOBIN: 13.1 g/dL (ref 13.0–17.0)
MCH: 30.2 pg (ref 26.0–34.0)
MCHC: 33.9 g/dL (ref 30.0–36.0)
MCV: 89.2 fL (ref 78.0–100.0)
Platelets: 359 10*3/uL (ref 150–400)
RBC: 4.34 MIL/uL (ref 4.22–5.81)
RDW: 12.5 % (ref 11.5–15.5)
WBC: 11.7 10*3/uL — ABNORMAL HIGH (ref 4.0–10.5)

## 2018-04-16 LAB — BASIC METABOLIC PANEL
Anion gap: 10 (ref 5–15)
BUN: 21 mg/dL (ref 8–23)
CHLORIDE: 90 mmol/L — AB (ref 98–111)
CO2: 30 mmol/L (ref 22–32)
CREATININE: 1.37 mg/dL — AB (ref 0.61–1.24)
Calcium: 9.3 mg/dL (ref 8.9–10.3)
GFR calc Af Amer: >60 mL/min (ref >60)
GFR calc non Af Amer: 54 mL/min — ABNORMAL LOW (ref >60)
Glucose, Bld: 96 mg/dL (ref 70–99)
Potassium: 3.9 mmol/L (ref 3.5–5.1)
Sodium: 130 mmol/L — ABNORMAL LOW (ref 135–145)

## 2018-04-16 LAB — GLUCOSE, CAPILLARY: Glucose-Capillary: 108 mg/dL — ABNORMAL HIGH (ref 70–99)

## 2018-04-16 LAB — HEMOGLOBIN A1C
Hgb A1c MFr Bld: 6 % — ABNORMAL HIGH (ref 4.8–5.6)
Mean Plasma Glucose: 125.5 mg/dL

## 2018-04-18 ENCOUNTER — Ambulatory Visit (INDEPENDENT_AMBULATORY_CARE_PROVIDER_SITE_OTHER): Payer: Medicare Other | Admitting: Otolaryngology

## 2018-04-18 DIAGNOSIS — H6122 Impacted cerumen, left ear: Secondary | ICD-10-CM

## 2018-04-18 DIAGNOSIS — H9012 Conductive hearing loss, unilateral, left ear, with unrestricted hearing on the contralateral side: Secondary | ICD-10-CM

## 2018-04-18 DIAGNOSIS — H6522 Chronic serous otitis media, left ear: Secondary | ICD-10-CM

## 2018-04-18 DIAGNOSIS — H6982 Other specified disorders of Eustachian tube, left ear: Secondary | ICD-10-CM | POA: Diagnosis not present

## 2018-04-22 ENCOUNTER — Ambulatory Visit (HOSPITAL_COMMUNITY): Payer: Medicare Other | Admitting: Anesthesiology

## 2018-04-22 ENCOUNTER — Ambulatory Visit (HOSPITAL_COMMUNITY)
Admission: RE | Admit: 2018-04-22 | Discharge: 2018-04-22 | Disposition: A | Discharge status: Home / Self Care | Payer: Medicare Other | Source: Ambulatory Visit | Attending: Ophthalmology | Admitting: Ophthalmology

## 2018-04-22 ENCOUNTER — Encounter (HOSPITAL_COMMUNITY)
Admission: RE | Disposition: A | Discharge status: Home / Self Care | Payer: Self-pay | Source: Ambulatory Visit | Attending: Ophthalmology

## 2018-04-22 ENCOUNTER — Encounter (HOSPITAL_COMMUNITY): Payer: Self-pay | Admitting: Anesthesiology

## 2018-04-22 ENCOUNTER — Other Ambulatory Visit: Payer: Self-pay

## 2018-04-22 DIAGNOSIS — H25812 Combined forms of age-related cataract, left eye: Secondary | ICD-10-CM | POA: Diagnosis not present

## 2018-04-22 DIAGNOSIS — G4730 Sleep apnea, unspecified: Secondary | ICD-10-CM | POA: Insufficient documentation

## 2018-04-22 DIAGNOSIS — I10 Essential (primary) hypertension: Secondary | ICD-10-CM | POA: Insufficient documentation

## 2018-04-22 DIAGNOSIS — E119 Type 2 diabetes mellitus without complications: Secondary | ICD-10-CM | POA: Diagnosis not present

## 2018-04-22 HISTORY — PX: CATARACT EXTRACTION W/PHACO: SHX586

## 2018-04-22 LAB — GLUCOSE, CAPILLARY: Glucose-Capillary: 118 mg/dL — ABNORMAL HIGH (ref 70–99)

## 2018-04-22 SURGERY — PHACOEMULSIFICATION, CATARACT, WITH IOL INSERTION
Anesthesia: Monitor Anesthesia Care | Site: Eye | Laterality: Left

## 2018-04-22 MED ORDER — PHENYLEPHRINE-KETOROLAC 1-0.3 % IO SOLN
INTRAOCULAR | Status: DC | PRN
  Administered 2018-04-22: 500 mL via OPHTHALMIC

## 2018-04-22 MED ORDER — ATROPINE SULFATE 0.4 MG/ML IJ SOLN
INTRAMUSCULAR | Status: AC
Start: 2018-04-22 — End: ?
  Filled 2018-04-22: qty 1

## 2018-04-22 MED ORDER — PROPOFOL 10 MG/ML IV BOLUS
INTRAVENOUS | Status: AC
  Filled 2018-04-22: qty 20

## 2018-04-22 MED ORDER — SUCCINYLCHOLINE CHLORIDE 20 MG/ML IJ SOLN
INTRAMUSCULAR | Status: AC
Start: 2018-04-22 — End: ?
  Filled 2018-04-22: qty 1

## 2018-04-22 MED ORDER — BSS IO SOLN
INTRAOCULAR | Status: DC | PRN
  Administered 2018-04-22: 15 mL

## 2018-04-22 MED ORDER — EPINEPHRINE PF 1 MG/ML IJ SOLN
INTRAOCULAR | Status: DC | PRN
  Administered 2018-04-22: .8 mL via OPHTHALMIC

## 2018-04-22 MED ORDER — PHENYLEPHRINE HCL 2.5 % OP SOLN
1.0000 [drp] | OPHTHALMIC | Status: AC
  Administered 2018-04-22 (×3): 1 [drp] via OPHTHALMIC

## 2018-04-22 MED ORDER — PROVISC 10 MG/ML IO SOLN
INTRAOCULAR | Status: DC | PRN
  Administered 2018-04-22: 0.85 mL via INTRAOCULAR

## 2018-04-22 MED ORDER — MIDAZOLAM HCL 5 MG/5ML IJ SOLN
INTRAMUSCULAR | Status: DC | PRN
  Administered 2018-04-22: 2 mg via INTRAVENOUS

## 2018-04-22 MED ORDER — NEOMYCIN-POLYMYXIN-DEXAMETH 3.5-10000-0.1 OP SUSP
OPHTHALMIC | Status: DC | PRN
  Administered 2018-04-22: 2 [drp] via OPHTHALMIC

## 2018-04-22 MED ORDER — TETRACAINE HCL 0.5 % OP SOLN
1.0000 [drp] | OPHTHALMIC | Status: AC
  Administered 2018-04-22 (×3): 1 [drp] via OPHTHALMIC

## 2018-04-22 MED ORDER — MIDAZOLAM HCL 2 MG/2ML IJ SOLN
INTRAMUSCULAR | Status: AC
  Filled 2018-04-22: qty 4

## 2018-04-22 MED ORDER — CYCLOPENTOLATE-PHENYLEPHRINE 0.2-1 % OP SOLN
1.0000 [drp] | OPHTHALMIC | Status: AC
  Administered 2018-04-22 (×3): 1 [drp] via OPHTHALMIC

## 2018-04-22 MED ORDER — LIDOCAINE HCL 3.5 % OP GEL
1.0000 "application " | Freq: Once | OPHTHALMIC | Status: AC
  Administered 2018-04-22: 1 via OPHTHALMIC

## 2018-04-22 MED ORDER — LACTATED RINGERS IV SOLN
INTRAVENOUS | Status: DC | PRN
Start: 2018-04-22 — End: 2018-04-22
  Administered 2018-04-22: 07:00:00 via INTRAVENOUS

## 2018-04-22 MED ORDER — POVIDONE-IODINE 5 % OP SOLN
OPHTHALMIC | Status: DC | PRN
  Administered 2018-04-22: 1 via OPHTHALMIC

## 2018-04-22 SURGICAL SUPPLY — 10 items
CLOTH BEACON ORANGE TIMEOUT ST (SAFETY) ×1 IMPLANT
EYE SHIELD UNIVERSAL CLEAR (GAUZE/BANDAGES/DRESSINGS) ×1 IMPLANT
GLOVE BIOGEL PI IND STRL 7.0 (GLOVE) IMPLANT
GLOVE BIOGEL PI INDICATOR 7.0 (GLOVE) ×2
LENS ALC ACRYL/TECN (Ophthalmic Related) ×1 IMPLANT
PAD ARMBOARD 7.5X6 YLW CONV (MISCELLANEOUS) ×1 IMPLANT
SYRINGE LUER LOK 1CC (MISCELLANEOUS) ×1 IMPLANT
TAPE SURG TRANSPORE 1 IN (GAUZE/BANDAGES/DRESSINGS) IMPLANT
TAPE SURGICAL TRANSPORE 1 IN (GAUZE/BANDAGES/DRESSINGS) ×1
WATER STERILE IRR 250ML POUR (IV SOLUTION) ×1 IMPLANT

## 2018-04-22 NOTE — Anesthesia Postprocedure Evaluation (Signed)
Anesthesia Post Note  Patient: Louis House  Procedure(s) Performed: CATARACT EXTRACTION PHACO AND INTRAOCULAR LENS PLACEMENT LEFT EYE (Left Eye)  Patient location during evaluation: Short Stay Anesthesia Type: MAC Level of consciousness: awake and alert and oriented Pain management: pain level controlled Vital Signs Assessment: post-procedure vital signs reviewed and stable Respiratory status: spontaneous breathing Cardiovascular status: stable Postop Assessment: no apparent nausea or vomiting Anesthetic complications: no     Last Vitals:  Vitals:   04/22/18 0715 04/22/18 0720  BP: 105/73 110/77  Pulse:    Resp: 14 (!) 22  Temp:    SpO2: 98% 96%    Last Pain:  Vitals:   04/22/18 0643  TempSrc: Oral  PainSc: 0-No pain                 Kajuan Guyton A

## 2018-04-22 NOTE — Anesthesia Procedure Notes (Signed)
Procedure Name: MAC Date/Time: 04/22/2018 7:26 AM Performed by: Andree Elk Amy A, CRNA Pre-anesthesia Checklist: Patient identified, Emergency Drugs available, Suction available, Patient being monitored and Timeout performed Oxygen Delivery Method: Nasal cannula

## 2018-04-22 NOTE — Transfer of Care (Signed)
Immediate Anesthesia Transfer of Care Note  Patient: Louis House  Procedure(s) Performed: CATARACT EXTRACTION PHACO AND INTRAOCULAR LENS PLACEMENT LEFT EYE (Left Eye)  Patient Location: Short Stay  Anesthesia Type:MAC  Level of Consciousness: awake, alert , oriented and patient cooperative  Airway & Oxygen Therapy: Patient Spontanous Breathing  Post-op Assessment: Report given to RN and Post -op Vital signs reviewed and stable  Post vital signs: Reviewed and stable  Last Vitals:  Vitals Value Taken Time  BP    Temp    Pulse    Resp    SpO2      Last Pain:  Vitals:   04/22/18 0643  TempSrc: Oral  PainSc: 0-No pain      Patients Stated Pain Goal: 5 (35/46/56 8127)  Complications: No apparent anesthesia complications

## 2018-04-22 NOTE — Op Note (Signed)
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  Assistants: None  Anesthesia: Topical with MAC  Indications: Painless, progressive loss of vision with compromise of daily activities.  Surgery: Cataract Extraction with Intraocular lens Implant Left Eye  Discription: The patient had dilating drops and viscous lidocaine placed into the Left eye in the pre-op holding area. After transfer to the operating room, a time out was performed. The patient was then prepped and draped. Omidria was added to the infusion fluids. Beginning with a 56m blade a paracentesis port was made at the surgeon's 2 o'clock position. The anterior chamber was then filled with 1% non-preserved lidocaine. This was followed by filling the anterior chamber with Provisc.  A 2.431mkeratome blade was used to make a clear corneal incision at the temporal limbus.  A bent cystatome needle was used to create a continuous tear capsulotomy. Hydrodissection was performed with balanced salt solution on a Fine canula. The lens nucleus was then removed using the phacoemulsification handpiece. Residual cortex was removed with the I&A handpiece. The anterior chamber and capsular bag were refilled with Provisc. A posterior chamber intraocular lens was placed into the capsular bag with it's injector. The implant was positioned with the Kuglan hook. The Provisc was then removed from the anterior chamber and capsular bag with the I&A handpiece. Stromal hydration of the main incision and paracentesis port was performed with BSS on a Fine canula. The wounds were tested for leak which was negative. The patient tolerated the procedure well. There were no operative complications. The patient was then transferred to the recovery room in stable condition.  Complications: None  Specimen: None  EBL: None  Prosthetic  device: J[employer]J Technis, PCB00, power 21.0, SN [id number]

## 2018-04-22 NOTE — Anesthesia Preprocedure Evaluation (Signed)
Anesthesia Evaluation  Patient identified by MRN, date of birth, ID band Patient awake    Reviewed: Allergy & Precautions, H&P , NPO status , Patient's Chart, lab work & pertinent test results, reviewed documented beta blocker date and time   Airway Mallampati: III  TM Distance: >3 FB Neck ROM: limited    Dental no notable dental hx.    Pulmonary neg pulmonary ROS, sleep apnea and Continuous Positive Airway Pressure Ventilation ,    Pulmonary exam normal breath sounds clear to auscultation       Cardiovascular Exercise Tolerance: Good hypertension, negative cardio ROS   Rhythm:regular Rate:Normal     Neuro/Psych negative neurological ROS  negative psych ROS   GI/Hepatic negative GI ROS, Neg liver ROS,   Endo/Other  negative endocrine ROSdiabetes  Renal/GU negative Renal ROS  negative genitourinary   Musculoskeletal   Abdominal   Peds  Hematology negative hematology ROS (+)   Anesthesia Other Findings   Reproductive/Obstetrics negative OB ROS                             Anesthesia Physical Anesthesia Plan  ASA: III  Anesthesia Plan: MAC   Post-op Pain Management:    Induction:   PONV Risk Score and Plan:   Airway Management Planned:   Additional Equipment:   Intra-op Plan:   Post-operative Plan:   Informed Consent: I have reviewed the patients History and Physical, chart, labs and discussed the procedure including the risks, benefits and alternatives for the proposed anesthesia with the patient or authorized representative who has indicated his/her understanding and acceptance.   Dental Advisory Given  Plan Discussed with: CRNA  Anesthesia Plan Comments:         Anesthesia Quick Evaluation

## 2018-04-22 NOTE — Discharge Instructions (Signed)

## 2018-04-22 NOTE — H&P (Signed)
I have reviewed the H&P, the patient was re-examined, and I have identified no interval changes in medical condition and plan of care since the history and physical of record  

## 2018-04-23 ENCOUNTER — Encounter (HOSPITAL_COMMUNITY): Payer: Self-pay | Admitting: Ophthalmology

## 2018-04-23 DIAGNOSIS — Z79891 Long term (current) use of opiate analgesic: Secondary | ICD-10-CM | POA: Diagnosis not present

## 2018-04-23 DIAGNOSIS — R252 Cramp and spasm: Secondary | ICD-10-CM | POA: Diagnosis not present

## 2018-04-23 DIAGNOSIS — M545 Low back pain: Secondary | ICD-10-CM | POA: Diagnosis not present

## 2018-04-23 DIAGNOSIS — G4733 Obstructive sleep apnea (adult) (pediatric): Secondary | ICD-10-CM | POA: Diagnosis not present

## 2018-05-31 DIAGNOSIS — H6522 Chronic serous otitis media, left ear: Secondary | ICD-10-CM | POA: Diagnosis not present

## 2018-05-31 DIAGNOSIS — H9012 Conductive hearing loss, unilateral, left ear, with unrestricted hearing on the contralateral side: Secondary | ICD-10-CM | POA: Diagnosis not present

## 2018-05-31 DIAGNOSIS — H6982 Other specified disorders of Eustachian tube, left ear: Secondary | ICD-10-CM | POA: Diagnosis not present

## 2018-06-10 DIAGNOSIS — E119 Type 2 diabetes mellitus without complications: Secondary | ICD-10-CM | POA: Diagnosis not present

## 2018-06-10 DIAGNOSIS — E7849 Other hyperlipidemia: Secondary | ICD-10-CM | POA: Diagnosis not present

## 2018-06-10 DIAGNOSIS — I10 Essential (primary) hypertension: Secondary | ICD-10-CM | POA: Diagnosis not present

## 2018-06-10 DIAGNOSIS — Z0000 Encounter for general adult medical examination without abnormal findings: Secondary | ICD-10-CM | POA: Diagnosis not present

## 2018-06-10 DIAGNOSIS — Z1389 Encounter for screening for other disorder: Secondary | ICD-10-CM | POA: Diagnosis not present

## 2018-06-17 ENCOUNTER — Ambulatory Visit (INDEPENDENT_AMBULATORY_CARE_PROVIDER_SITE_OTHER): Payer: Medicare Other | Admitting: Internal Medicine

## 2018-06-17 ENCOUNTER — Encounter (INDEPENDENT_AMBULATORY_CARE_PROVIDER_SITE_OTHER): Payer: Self-pay | Admitting: Internal Medicine

## 2018-06-17 VITALS — BP 148/90 | HR 64 | Temp 97.8°F | Ht 73.0 in | Wt 295.0 lb

## 2018-06-17 DIAGNOSIS — K529 Noninfective gastroenteritis and colitis, unspecified: Secondary | ICD-10-CM

## 2018-06-17 MED ORDER — MESALAMINE ER 0.375 G PO CP24: 375.0000 mg | ORAL_CAPSULE | Freq: Every day | ORAL | 9 refills | Status: DC

## 2018-06-17 NOTE — Progress Notes (Signed)
Subjective:    Patient ID: Louis House, male    DOB: [birth year], 62 y.o.   MRN: [id number]  HPI  Here today for f/u. Last seen in April of this year. Hx of IBD. Maintained on Apriso.  He is doing good. He is having a BM x 1 a day. No abdominal pain. His appetite is good.  He has no GI complaints. Underwent a colonoscopy in July of 2018 for diarrhea. Stool studies were negative.  Biopsy shows changes of acute colitis possibly IBD.     CBC    Component Value Date/Time   WBC 11.7 (H) 04/16/2018 1522   RBC 4.34 04/16/2018 1522   HGB 13.1 04/16/2018 1522   HCT 38.7 (L) 04/16/2018 1522   PLT 359 04/16/2018 1522   MCV 89.2 04/16/2018 1522   MCH 30.2 04/16/2018 1522   MCHC 33.9 04/16/2018 1522   RDW 12.5 04/16/2018 1522   LYMPHSABS 2,606 12/13/2017 0945   MONOABS 0.6 04/03/2017 0908   EOSABS 330 12/13/2017 0945   BASOSABS 144 12/13/2017 0945   Erythrocyte Sedimentation Rate     Component Value Date/Time   ESRSEDRATE 11 06/15/2017 1002      Review of Systems  Past Medical History:  Diagnosis Date  . Anxiety   . Diabetes ([place]) 01/12/2017  . Diarrhea 02/21/2017  . Essential hypertension, benign 01/12/2017  . Gout   . High cholesterol 01/12/2017  . Neuropathy   . Sleep apnea   . Vertigo     Past Surgical History:  Procedure Laterality Date  . BIOPSY  04/06/2017   Procedure: BIOPSY;  Surgeon: Rogene Houston, MD;  [facility];  Service: Endoscopy;;  colon  . bone spurs    . CATARACT EXTRACTION W/PHACO Left 04/22/2018   Procedure: CATARACT EXTRACTION PHACO AND INTRAOCULAR LENS PLACEMENT LEFT EYE;  Surgeon: Tonny Branch, MD;  Location: [facility];  Service: Ophthalmology;  Laterality: Left;  CDE: 7.58  . COLONOSCOPY WITH PROPOFOL N/A 04/06/2017   Procedure: COLONOSCOPY WITH PROPOFOL;  Surgeon: Rogene Houston, MD;  [facility];  Service: Endoscopy;  Laterality: N/A;  1:45  . kneee arthroscopy     both knees  . Left shoulder surgery from injury    .  Umblical hernia      No Known Allergies  Current Outpatient Medications on File Prior to Visit  Medication Sig Dispense Refill  . allopurinol (ZYLOPRIM) 300 MG tablet Take 300 mg by mouth daily.    Marland Kitchen atorvastatin (LIPITOR) 40 MG tablet Take 40 mg by mouth daily.    . benazepril (LOTENSIN) 40 MG tablet Take 40 mg by mouth daily.  0  . chlorthalidone (HYGROTON) 25 MG tablet Take 25 mg by mouth daily.    . diazepam (VALIUM) 10 MG tablet Take 10 mg by mouth at bedtime.     . diclofenac (VOLTAREN) 75 MG EC tablet Take 75 mg by mouth 2 (two) times daily.    Marland Kitchen dicyclomine (BENTYL) 20 MG tablet TAKE ONE TABLET BY MOUTH FOUR TIMES DAILY 120 tablet 2  . fluticasone (FLONASE) 50 MCG/ACT nasal spray Place 1 spray into both nostrils daily.    Marland Kitchen gabapentin (NEURONTIN) 400 MG capsule Take 400 mg by mouth 2 (two) times daily as needed (scheduled at bedtime & if needed during the day for pain.).     Marland Kitchen HYDROcodone-acetaminophen (NORCO) 7.5-325 MG tablet Take 1 tablet by mouth every 6 (six) hours as needed for moderate pain.    Marland Kitchen  loratadine (CLARITIN) 10 MG tablet Take 10 mg by mouth daily.    . meclizine (ANTIVERT) 25 MG tablet Take 25 mg by mouth 3 (three) times daily as needed for dizziness (for flare up of inner ear issues.).     Marland Kitchen metFORMIN (GLUCOPHAGE) 500 MG tablet Take 500 mg by mouth 2 (two) times daily with a meal. Morning & in the evening    . metoprolol succinate (TOPROL-XL) 100 MG 24 hr tablet Take 100 mg by mouth daily. Take with or immediately following a meal.    . Omega-3 Fatty Acids (FISH OIL) 1200 MG CAPS Take 2,400 mg by mouth daily.    . tamsulosin (FLOMAX) 0.4 MG CAPS capsule Take 0.4 mg by mouth.    . tizanidine (ZANAFLEX) 2 MG capsule Take 2 mg by mouth 3 (three) times daily as needed for muscle spasms.      Current Facility-Administered Medications on File Prior to Visit  Medication Dose Route Frequency Provider Last Rate Last Dose  . dicyclomine (BENTYL) tablet 20 mg  20 mg Oral TID  AC & HS Setzer, Terri L, NP              Objective:   Physical Exam Blood pressure (!) 148/90, pulse 64, temperature 97.8 F (36.6 C), height 6' 1"  (1.854 m), weight 295 lb (133.8 kg). Alert and oriented. Skin warm and dry. Oral mucosa is moist.   . Sclera anicteric, conjunctivae is pink. Thyroid not enlarged. No cervical lymphadenopathy. Lungs clear. Heart regular rate and rhythm.  Abdomen is soft. Bowel sounds are positive. No hepatomegaly. No abdominal masses felt. No tenderness.  No edema to lower extremities.           Assessment & Plan:  IBD. Doing well. No diarrhea. Will get a sedrate. CBC is UTD and looks good.  OV in 6 months Samples of Apriso given to patient.

## 2018-06-17 NOTE — Patient Instructions (Signed)
Sedrate. OV in 6 months.

## 2018-06-18 LAB — CBC WITH DIFFERENTIAL/PLATELET
BASOS PCT: 1.3 %
Basophils Absolute: 116 cells/uL (ref 0–200)
EOS PCT: 6.4 %
Eosinophils Absolute: 570 cells/uL — ABNORMAL HIGH (ref 15–500)
HEMATOCRIT: 37.1 % — AB (ref 38.5–50.0)
Hemoglobin: 12.4 g/dL — ABNORMAL LOW (ref 13.2–17.1)
LYMPHS ABS: 2181 {cells}/uL (ref 850–3900)
MCH: 29 pg (ref 27.0–33.0)
MCHC: 33.4 g/dL (ref 32.0–36.0)
MCV: 86.7 fL (ref 80.0–100.0)
MPV: 9.7 fL (ref 7.5–12.5)
Monocytes Relative: 7.3 %
NEUTROS ABS: 5385 {cells}/uL (ref 1500–7800)
NEUTROS PCT: 60.5 %
Platelets: 324 10*3/uL (ref 140–400)
RBC: 4.28 10*6/uL (ref 4.20–5.80)
RDW: 12.7 % (ref 11.0–15.0)
Total Lymphocyte: 24.5 %
WBC: 8.9 10*3/uL (ref 3.8–10.8)
WBCMIX: 650 {cells}/uL (ref 200–950)

## 2018-06-18 LAB — SEDIMENTATION RATE: SED RATE: 19 mm/h (ref 0–20)

## 2018-07-01 ENCOUNTER — Ambulatory Visit (INDEPENDENT_AMBULATORY_CARE_PROVIDER_SITE_OTHER): Payer: Medicare Other | Admitting: Otolaryngology

## 2018-07-01 DIAGNOSIS — H7202 Central perforation of tympanic membrane, left ear: Secondary | ICD-10-CM | POA: Diagnosis not present

## 2018-07-01 DIAGNOSIS — H903 Sensorineural hearing loss, bilateral: Secondary | ICD-10-CM | POA: Diagnosis not present

## 2018-07-01 DIAGNOSIS — R42 Dizziness and giddiness: Secondary | ICD-10-CM | POA: Diagnosis not present

## 2018-07-01 DIAGNOSIS — H8111 Benign paroxysmal vertigo, right ear: Secondary | ICD-10-CM

## 2018-07-01 DIAGNOSIS — H6983 Other specified disorders of Eustachian tube, bilateral: Secondary | ICD-10-CM

## 2018-07-09 DIAGNOSIS — L03012 Cellulitis of left finger: Secondary | ICD-10-CM | POA: Diagnosis not present

## 2018-07-12 DIAGNOSIS — E785 Hyperlipidemia, unspecified: Secondary | ICD-10-CM | POA: Diagnosis not present

## 2018-07-12 DIAGNOSIS — E871 Hypo-osmolality and hyponatremia: Secondary | ICD-10-CM | POA: Diagnosis not present

## 2018-07-12 DIAGNOSIS — G8929 Other chronic pain: Secondary | ICD-10-CM | POA: Diagnosis not present

## 2018-07-12 DIAGNOSIS — Z7984 Long term (current) use of oral hypoglycemic drugs: Secondary | ICD-10-CM | POA: Diagnosis not present

## 2018-07-12 DIAGNOSIS — L03012 Cellulitis of left finger: Secondary | ICD-10-CM | POA: Diagnosis not present

## 2018-07-12 DIAGNOSIS — E1140 Type 2 diabetes mellitus with diabetic neuropathy, unspecified: Secondary | ICD-10-CM | POA: Diagnosis not present

## 2018-07-12 DIAGNOSIS — Z79899 Other long term (current) drug therapy: Secondary | ICD-10-CM | POA: Diagnosis not present

## 2018-07-12 DIAGNOSIS — I10 Essential (primary) hypertension: Secondary | ICD-10-CM | POA: Diagnosis not present

## 2018-07-12 DIAGNOSIS — R7989 Other specified abnormal findings of blood chemistry: Secondary | ICD-10-CM | POA: Diagnosis not present

## 2018-07-12 DIAGNOSIS — M109 Gout, unspecified: Secondary | ICD-10-CM | POA: Diagnosis not present

## 2018-07-22 DIAGNOSIS — L03012 Cellulitis of left finger: Secondary | ICD-10-CM | POA: Diagnosis not present

## 2018-07-24 DIAGNOSIS — G4733 Obstructive sleep apnea (adult) (pediatric): Secondary | ICD-10-CM | POA: Diagnosis not present

## 2018-07-24 DIAGNOSIS — M545 Low back pain: Secondary | ICD-10-CM | POA: Diagnosis not present

## 2018-07-24 DIAGNOSIS — R252 Cramp and spasm: Secondary | ICD-10-CM | POA: Diagnosis not present

## 2018-07-24 DIAGNOSIS — Z79891 Long term (current) use of opiate analgesic: Secondary | ICD-10-CM | POA: Diagnosis not present

## 2018-07-29 ENCOUNTER — Ambulatory Visit (INDEPENDENT_AMBULATORY_CARE_PROVIDER_SITE_OTHER): Payer: Medicare Other | Admitting: Otolaryngology

## 2018-09-17 DIAGNOSIS — E11610 Type 2 diabetes mellitus with diabetic neuropathic arthropathy: Secondary | ICD-10-CM | POA: Diagnosis not present

## 2018-09-17 DIAGNOSIS — Z0000 Encounter for general adult medical examination without abnormal findings: Secondary | ICD-10-CM | POA: Diagnosis not present

## 2018-09-17 DIAGNOSIS — I10 Essential (primary) hypertension: Secondary | ICD-10-CM | POA: Diagnosis not present

## 2018-10-16 DIAGNOSIS — M545 Low back pain: Secondary | ICD-10-CM | POA: Diagnosis not present

## 2018-10-16 DIAGNOSIS — G4733 Obstructive sleep apnea (adult) (pediatric): Secondary | ICD-10-CM | POA: Diagnosis not present

## 2018-10-16 DIAGNOSIS — R252 Cramp and spasm: Secondary | ICD-10-CM | POA: Diagnosis not present

## 2018-10-16 DIAGNOSIS — Z79891 Long term (current) use of opiate analgesic: Secondary | ICD-10-CM | POA: Diagnosis not present

## 2018-12-17 ENCOUNTER — Ambulatory Visit (INDEPENDENT_AMBULATORY_CARE_PROVIDER_SITE_OTHER): Payer: Medicare Other | Admitting: Internal Medicine

## 2018-12-18 ENCOUNTER — Ambulatory Visit (INDEPENDENT_AMBULATORY_CARE_PROVIDER_SITE_OTHER): Payer: Medicare Other | Admitting: Internal Medicine

## 2018-12-18 ENCOUNTER — Other Ambulatory Visit: Payer: Self-pay

## 2018-12-18 ENCOUNTER — Encounter (INDEPENDENT_AMBULATORY_CARE_PROVIDER_SITE_OTHER): Payer: Self-pay | Admitting: Internal Medicine

## 2018-12-18 DIAGNOSIS — K529 Noninfective gastroenteritis and colitis, unspecified: Secondary | ICD-10-CM | POA: Diagnosis not present

## 2018-12-18 NOTE — Patient Instructions (Signed)
OV in 6 months

## 2018-12-18 NOTE — Progress Notes (Addendum)
Subjective:  PCP Dr. Sherrie Sport  Patient ID: Louis House, male    DOB: [birth year], 63 y.o.   MRN: [id number] Start 258pm Ended  318pm. Total time 20 minutes.  Telephone visit due to COVID-19.  Video office visit attempted by 2 but was unsuccessful. This is a telephone office visit. Patient is agreeable to this visit and to talk with me. He is at home and I am in the office. I have identified him by 2 means (name and birthday).  HPI Louis House is a 63 yr old male with IBD. Last seen in October of 2019. Maintained of Apriso.  Underwent a colonoscopy in July of 2018 for diarrhea. Stool studies were negative. Biopsy shows changes of acute colitis possibly IBD.  Sedrate normal in October.  States he was off the Apriso for about 3 weeks, his stools became very loose.  Realized he needed to stay on the Apriso.  Once he started again, things were back on track. He is having 2-3 BMs daily. No melena or BRRB. His appetite is good. No weight loss.    Erythrocyte Sedimentation Rate     Component Value Date/Time   ESRSEDRATE 19 06/17/2018 1450     CBC    Component Value Date/Time   WBC 8.9 06/17/2018 1450   RBC 4.28 06/17/2018 1450   HGB 12.4 (L) 06/17/2018 1450   HCT 37.1 (L) 06/17/2018 1450   PLT 324 06/17/2018 1450   MCV 86.7 06/17/2018 1450   MCH 29.0 06/17/2018 1450   MCHC 33.4 06/17/2018 1450   RDW 12.7 06/17/2018 1450   LYMPHSABS 2,181 06/17/2018 1450   MONOABS 0.6 04/03/2017 0908   EOSABS 570 (H) 06/17/2018 1450   BASOSABS 116 06/17/2018 1450    Review of Systems     Past Medical History:  Diagnosis Date  . Anxiety   . Diabetes ([place]) 01/12/2017  . Diarrhea 02/21/2017  . Essential hypertension, benign 01/12/2017  . Gout   . High cholesterol 01/12/2017  . Neuropathy   . Sleep apnea   . Vertigo     Past Surgical History:  Procedure Laterality Date  . BIOPSY  04/06/2017   Procedure: BIOPSY;  Surgeon: Rogene Houston, MD;  [facility];  Service: Endoscopy;;  colon   . bone spurs    . CATARACT EXTRACTION W/PHACO Left 04/22/2018   Procedure: CATARACT EXTRACTION PHACO AND INTRAOCULAR LENS PLACEMENT LEFT EYE;  Surgeon: Tonny Branch, MD;  Location: [facility];  Service: Ophthalmology;  Laterality: Left;  CDE: 7.58  . COLONOSCOPY WITH PROPOFOL N/A 04/06/2017   Procedure: COLONOSCOPY WITH PROPOFOL;  Surgeon: Rogene Houston, MD;  [facility];  Service: Endoscopy;  Laterality: N/A;  1:45  . kneee arthroscopy     both knees  . Left shoulder surgery from injury    . Umblical hernia      No Known Allergies  Current Outpatient Medications on File Prior to Visit  Medication Sig Dispense Refill  . allopurinol (ZYLOPRIM) 300 MG tablet Take 300 mg by mouth daily.    Marland Kitchen atorvastatin (LIPITOR) 40 MG tablet Take 40 mg by mouth daily.    . benazepril (LOTENSIN) 40 MG tablet Take 40 mg by mouth daily.  0  . chlorthalidone (HYGROTON) 25 MG tablet Take 25 mg by mouth daily.    . diazepam (VALIUM) 10 MG tablet Take 10 mg by mouth at bedtime.     . diclofenac (VOLTAREN) 75 MG EC tablet Take 75 mg by mouth 2 (  two) times daily.    Marland Kitchen dicyclomine (BENTYL) 20 MG tablet TAKE ONE TABLET BY MOUTH FOUR TIMES DAILY 120 tablet 2  . fluticasone (FLONASE) 50 MCG/ACT nasal spray Place 1 spray into both nostrils daily.    Marland Kitchen gabapentin (NEURONTIN) 400 MG capsule Take 400 mg by mouth 2 (two) times daily as needed (scheduled at bedtime & if needed during the day for pain.).     Marland Kitchen HYDROcodone-acetaminophen (NORCO) 7.5-325 MG tablet Take 1 tablet by mouth every 6 (six) hours as needed for moderate pain.    . meclizine (ANTIVERT) 25 MG tablet Take 25 mg by mouth 3 (three) times daily as needed for dizziness (for flare up of inner ear issues.).     Marland Kitchen mesalamine (APRISO) 0.375 g 24 hr capsule Take 1 capsule (0.375 g total) by mouth daily. Take 4 a day 120 capsule 9  . metFORMIN (GLUCOPHAGE) 500 MG tablet Take 500 mg by mouth 2 (two) times daily with a meal. Morning & in the evening    .  metoprolol succinate (TOPROL-XL) 100 MG 24 hr tablet Take 100 mg by mouth daily. Take with or immediately following a meal.    . Omega-3 Fatty Acids (FISH OIL) 1200 MG CAPS Take 2,400 mg by mouth daily.    . tizanidine (ZANAFLEX) 2 MG capsule Take 2 mg by mouth 3 (three) times daily as needed for muscle spasms.      Current Facility-Administered Medications on File Prior to Visit  Medication Dose Route Frequency Provider Last Rate Last Dose  . dicyclomine (BENTYL) tablet 20 mg  20 mg Oral TID AC & HS Hikari Tripp L, NP         Objective:   Physical Exam  deferred      Assessment & Plan:  IBD.  He is doing well. No GI complaints. Stools are much better. He will have blood work drawn at PCP and have it sent to me. PCP Dr. Sherrie Sport  OV in 6 months.

## 2018-12-25 DIAGNOSIS — E11610 Type 2 diabetes mellitus with diabetic neuropathic arthropathy: Secondary | ICD-10-CM | POA: Diagnosis not present

## 2018-12-25 DIAGNOSIS — I10 Essential (primary) hypertension: Secondary | ICD-10-CM | POA: Diagnosis not present

## 2019-01-08 DIAGNOSIS — R252 Cramp and spasm: Secondary | ICD-10-CM | POA: Diagnosis not present

## 2019-01-08 DIAGNOSIS — M545 Low back pain: Secondary | ICD-10-CM | POA: Diagnosis not present

## 2019-01-08 DIAGNOSIS — G4733 Obstructive sleep apnea (adult) (pediatric): Secondary | ICD-10-CM | POA: Diagnosis not present

## 2019-01-08 DIAGNOSIS — Z79891 Long term (current) use of opiate analgesic: Secondary | ICD-10-CM | POA: Diagnosis not present

## 2019-03-27 DIAGNOSIS — Z1389 Encounter for screening for other disorder: Secondary | ICD-10-CM | POA: Diagnosis not present

## 2019-03-27 DIAGNOSIS — M109 Gout, unspecified: Secondary | ICD-10-CM | POA: Diagnosis not present

## 2019-03-27 DIAGNOSIS — Z0000 Encounter for general adult medical examination without abnormal findings: Secondary | ICD-10-CM | POA: Diagnosis not present

## 2019-03-27 DIAGNOSIS — E11610 Type 2 diabetes mellitus with diabetic neuropathic arthropathy: Secondary | ICD-10-CM | POA: Diagnosis not present

## 2019-03-27 DIAGNOSIS — I10 Essential (primary) hypertension: Secondary | ICD-10-CM | POA: Diagnosis not present

## 2019-04-18 ENCOUNTER — Other Ambulatory Visit: Payer: Self-pay

## 2019-04-18 NOTE — Patient Outreach (Signed)
[facility]) Care Management  04/18/2019  Louis House [birth year] [id number]   Medication Adherence call to [name] Telephone call to Patient regarding Medication Adherence unable to reach patient. Louis House on Metformin 500 mg under Golden.   [facility] Direct Dial [phone]  Fax [phone] [email or web address]

## 2019-04-22 DIAGNOSIS — E11319 Type 2 diabetes mellitus with unspecified diabetic retinopathy without macular edema: Secondary | ICD-10-CM | POA: Diagnosis not present

## 2019-04-22 DIAGNOSIS — H524 Presbyopia: Secondary | ICD-10-CM | POA: Diagnosis not present

## 2019-04-23 DIAGNOSIS — R252 Cramp and spasm: Secondary | ICD-10-CM | POA: Diagnosis not present

## 2019-04-23 DIAGNOSIS — M545 Low back pain: Secondary | ICD-10-CM | POA: Diagnosis not present

## 2019-04-23 DIAGNOSIS — Z79891 Long term (current) use of opiate analgesic: Secondary | ICD-10-CM | POA: Diagnosis not present

## 2019-04-23 DIAGNOSIS — G4733 Obstructive sleep apnea (adult) (pediatric): Secondary | ICD-10-CM | POA: Diagnosis not present

## 2019-06-19 ENCOUNTER — Other Ambulatory Visit: Payer: Self-pay

## 2019-06-19 ENCOUNTER — Encounter (INDEPENDENT_AMBULATORY_CARE_PROVIDER_SITE_OTHER): Payer: Self-pay | Admitting: Nurse Practitioner

## 2019-06-19 ENCOUNTER — Ambulatory Visit (INDEPENDENT_AMBULATORY_CARE_PROVIDER_SITE_OTHER): Payer: Medicare Other | Admitting: Nurse Practitioner

## 2019-06-19 VITALS — BP 113/76 | HR 73 | Temp 98.8°F | Ht 73.0 in | Wt 272.4 lb

## 2019-06-19 DIAGNOSIS — K529 Noninfective gastroenteritis and colitis, unspecified: Secondary | ICD-10-CM | POA: Diagnosis not present

## 2019-06-19 DIAGNOSIS — K523 Indeterminate colitis: Secondary | ICD-10-CM | POA: Insufficient documentation

## 2019-06-19 NOTE — Progress Notes (Signed)
Subjective:    Patient ID: [name], male    DOB: 10-04-1955, 63 y.o.   MRN: [id number]  HPI Louis House is a 63 year old male who presents today for colitis follow up. He underwent a colonoscopy 03/2017 due to having diarrhea. The colonoscopy identified acute colitis with ulceration to the ascending and sigmoid colon. The biopsy results were nonspecific and possible etiologies include drug effect (he is on Diclofoenac 2m one one tab daily, sometimes takes bid for years, on Metformin for several years), infection, ischemia verses early inflammatory bowel disease.  He was prescribed Apriso 0.3723mfour tabs daily and his diarrhea completely resolved. No known family history of IBD. His quality of life significantly improved on the Aprisos therefore he is not interested in stopping this medication. Approximately one year ago, he stopped taking the Apriso for 3 weeks and his diarrhea recurred. Once he went back on Apriso, his diarrhea resolved. He denies having any abdominal pain. He is passing a normal formed brown stool once daily. He had diarrhea for one day about  6 weeks ago which last for one day.  No bloody diarrhea. No melena. No GERD symptoms.   Colonoscopy 04/06/2017: - Congested mucosa in the ascending colon. Biopsied. - The rectum, sigmoid colon, descending colon, splenic flexure, transverse colon and hepatic flexure are normal.  Diagnosis 1. Colon, biopsy, ascending - COLONIC MUCOSA WITH ACUTE INFLAMMATION AND ULCERATION - SEE COMMENT 2. Colon, biopsy, sigmoid - COLONIC MUCOSA WITH ACUTE INFLAMMATION AND ULCERATION - SEE COMMENT   Past Medical History:  Diagnosis Date  . Anxiety   . [place]  . Diarrhea 02/21/2017  . Essential hypertension, benign 01/12/2017  . Gout   . High cholesterol 01/12/2017  . Neuropathy   . Sleep apnea   . Vertigo    Past Surgical History:  Procedure Laterality Date  . BIOPSY  04/06/2017   Procedure: BIOPSY;  Surgeon:  ReRogene HoustonMD;  [facility];  Service: Endoscopy;;  colon  . bone spurs    . CATARACT EXTRACTION W/PHACO Left 04/22/2018   Procedure: CATARACT EXTRACTION PHACO AND INTRAOCULAR LENS PLACEMENT LEFT EYE;  Surgeon: HuTonny BranchMD;  Location: [facility];  Service: Ophthalmology;  Laterality: Left;  CDE: 7.58  . COLONOSCOPY WITH PROPOFOL N/A 04/06/2017   Procedure: COLONOSCOPY WITH PROPOFOL;  Surgeon: ReRogene HoustonMD;  [facility];  Service: Endoscopy;  Laterality: N/A;  1:45  . kneee arthroscopy     both knees  . Left shoulder surgery from injury    . Umblical hernia     Current Outpatient Medications on File Prior to Visit  Medication Sig Dispense Refill  . allopurinol (ZYLOPRIM) 300 MG tablet Take 300 mg by mouth daily.    . Marland Kitchentorvastatin (LIPITOR) 40 MG tablet Take 40 mg by mouth daily.    . benazepril (LOTENSIN) 40 MG tablet Take 40 mg by mouth daily.  0  . chlorthalidone (HYGROTON) 25 MG tablet Take 25 mg by mouth daily.    . diazepam (VALIUM) 10 MG tablet Take 10 mg by mouth at bedtime.     . diclofenac (VOLTAREN) 75 MG EC tablet Take 75 mg by mouth 2 (two) times daily.    . Marland Kitchenicyclomine (BENTYL) 20 MG tablet TAKE ONE TABLET BY MOUTH FOUR TIMES DAILY 120 tablet 2  . fluticasone (FLONASE) 50 MCG/ACT nasal spray Place 1 spray into both nostrils daily.    . Marland Kitchenabapentin (NEURONTIN) 400 MG capsule  Take 400 mg by mouth 2 (two) times daily as needed (scheduled at bedtime & if needed during the day for pain.).     Marland Kitchen HYDROcodone-acetaminophen (NORCO) 7.5-325 MG tablet Take 1 tablet by mouth every 6 (six) hours as needed for moderate pain.    . meclizine (ANTIVERT) 25 MG tablet Take 25 mg by mouth 3 (three) times daily as needed for dizziness (for flare up of inner ear issues.).     Marland Kitchen mesalamine (APRISO) 0.375 g 24 hr capsule Take 1 capsule (0.375 g total) by mouth daily. Take 4 a day 120 capsule 9  . metFORMIN (GLUCOPHAGE) 500 MG tablet Take 500 mg by mouth 2 (two) times  daily with a meal. Morning & in the evening    . metoprolol succinate (TOPROL-XL) 100 MG 24 hr tablet Take 100 mg by mouth daily. Take with or immediately following a meal.    . Omega-3 Fatty Acids (FISH OIL) 1200 MG CAPS Take 2,400 mg by mouth daily.    . tizanidine (ZANAFLEX) 2 MG capsule Take 2 mg by mouth 3 (three) times daily as needed for muscle spasms.      Current Facility-Administered Medications on File Prior to Visit  Medication Dose Route Frequency Provider Last Rate Last Dose  . dicyclomine (BENTYL) tablet 20 mg  20 mg Oral TID AC & HS Setzer, Terri L, NP        Review of Systems See HPI, all other systems reviewed and are negative      Objective:   Physical Exam  BP 113/76   Pulse 73   Temp 98.8 F (37.1 C)   Ht 6' 1"  (1.854 m)   Wt 272 lb 6.4 oz (123.6 kg)   BMI 35.94 kg/m  General: 63 year old male well developed in NAD Eyes: sclera nonicteric, conjunctiva pink Mouth: poor dentition Neck: supple Heart: RRR, no murmurs Lungs: clear throughout Abdomen: soft, nontender, + BS x 4 quads, no HSM Extremities: no edema    Assessment & Plan:  74. 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 should patient have earlier colonoscopy than 10 yr recall to clarify colitis diagnosis, further recommendation per  Dr. RLaural Golden 2. DM on Metformin

## 2019-06-19 NOTE — Patient Instructions (Signed)
  1. Complete the ordered blood tests  2. Continue Apriso as previously prescribed  3. Follow up in the office in 6 months   4. If your diarrhea recurs I would recommend stopping the Diclofenac medication

## 2019-06-20 LAB — COMPLETE METABOLIC PANEL WITH GFR
AG Ratio: 1.7 (calc) (ref 1.0–2.5)
ALT: 24 U/L (ref 9–46)
AST: 18 U/L (ref 10–35)
Albumin: 4.3 g/dL (ref 3.6–5.1)
Alkaline phosphatase (APISO): 47 U/L (ref 35–144)
BUN/Creatinine Ratio: 31 (calc) — ABNORMAL HIGH (ref 6–22)
BUN: 39 mg/dL — ABNORMAL HIGH (ref 7–25)
CO2: 34 mmol/L — ABNORMAL HIGH (ref 20–32)
Calcium: 10.2 mg/dL (ref 8.6–10.3)
Chloride: 96 mmol/L — ABNORMAL LOW (ref 98–110)
Creat: 1.27 mg/dL — ABNORMAL HIGH (ref 0.70–1.25)
GFR, Est African American: 69 mL/min/{1.73_m2} (ref >=60)
GFR, Est Non African American: 60 mL/min/{1.73_m2} (ref >=60)
Globulin: 2.5 g/dL (calc) (ref 1.9–3.7)
Glucose, Bld: 96 mg/dL (ref 65–139)
Potassium: 4.4 mmol/L (ref 3.5–5.3)
Sodium: 136 mmol/L (ref 135–146)
Total Bilirubin: 0.4 mg/dL (ref 0.2–1.2)
Total Protein: 6.8 g/dL (ref 6.1–8.1)

## 2019-06-20 LAB — CBC WITH DIFFERENTIAL/PLATELET
Absolute Monocytes: 635 cells/uL (ref 200–950)
Basophils Absolute: 120 cells/uL (ref 0–200)
Basophils Relative: 1.3 %
Eosinophils Absolute: 322 cells/uL (ref 15–500)
Eosinophils Relative: 3.5 %
HCT: 40.2 % (ref 38.5–50.0)
Hemoglobin: 13.6 g/dL (ref 13.2–17.1)
Lymphs Abs: 1610 cells/uL (ref 850–3900)
MCH: 30.1 pg (ref 27.0–33.0)
MCHC: 33.8 g/dL (ref 32.0–36.0)
MCV: 88.9 fL (ref 80.0–100.0)
MPV: 9.9 fL (ref 7.5–12.5)
Monocytes Relative: 6.9 %
Neutro Abs: 6514 cells/uL (ref 1500–7800)
Neutrophils Relative %: 70.8 %
Platelets: 357 10*3/uL (ref 140–400)
RBC: 4.52 10*6/uL (ref 4.20–5.80)
RDW: 13.3 % (ref 11.0–15.0)
Total Lymphocyte: 17.5 %
WBC: 9.2 10*3/uL (ref 3.8–10.8)

## 2019-06-20 LAB — CELIAC DISEASE PANEL
(tTG) Ab, IgA: 1 U/mL
(tTG) Ab, IgG: 1 U/mL
Gliadin IgA: 2 Units
Gliadin IgG: 1 Units
Immunoglobulin A: 121 mg/dL (ref 70–320)

## 2019-06-20 LAB — C-REACTIVE PROTEIN: CRP: 4.1 mg/L (ref <8.0)

## 2019-06-29 ENCOUNTER — Telehealth (INDEPENDENT_AMBULATORY_CARE_PROVIDER_SITE_OTHER): Payer: Self-pay | Admitting: Nurse Practitioner

## 2019-06-29 NOTE — Telephone Encounter (Signed)
I called the patient and left a msg on his mobile voicemail celiac panel normal. Cr a little elevated but much better when compared to labs 1 year ago. I advised pt to follow up with his pcp and have Creatinine level repeated in 4 to 6 months.

## 2019-06-30 DIAGNOSIS — M109 Gout, unspecified: Secondary | ICD-10-CM | POA: Diagnosis not present

## 2019-06-30 DIAGNOSIS — I10 Essential (primary) hypertension: Secondary | ICD-10-CM | POA: Diagnosis not present

## 2019-06-30 DIAGNOSIS — E11610 Type 2 diabetes mellitus with diabetic neuropathic arthropathy: Secondary | ICD-10-CM | POA: Diagnosis not present

## 2019-07-16 DIAGNOSIS — Z79891 Long term (current) use of opiate analgesic: Secondary | ICD-10-CM | POA: Diagnosis not present

## 2019-07-16 DIAGNOSIS — M545 Low back pain: Secondary | ICD-10-CM | POA: Diagnosis not present

## 2019-07-16 DIAGNOSIS — R252 Cramp and spasm: Secondary | ICD-10-CM | POA: Diagnosis not present

## 2019-07-16 DIAGNOSIS — G4733 Obstructive sleep apnea (adult) (pediatric): Secondary | ICD-10-CM | POA: Diagnosis not present

## 2019-08-21 ENCOUNTER — Other Ambulatory Visit: Payer: Self-pay

## 2019-08-21 NOTE — Patient Outreach (Signed)
Louis House) Care Management  08/21/2019  Louis House 1955-10-22 [id number]   Medication Adherence call to [name] Telephone call to Patient regarding Medication Adherence unable to reach patient. Louis House is showing past due on Metformin 500 mg under Louis House.  [facility] Direct Dial [phone]  Fax [phone] [email or web address]

## 2019-09-30 DIAGNOSIS — Z0000 Encounter for general adult medical examination without abnormal findings: Secondary | ICD-10-CM | POA: Diagnosis not present

## 2019-09-30 DIAGNOSIS — M109 Gout, unspecified: Secondary | ICD-10-CM | POA: Diagnosis not present

## 2019-09-30 DIAGNOSIS — I10 Essential (primary) hypertension: Secondary | ICD-10-CM | POA: Diagnosis not present

## 2019-09-30 DIAGNOSIS — E11610 Type 2 diabetes mellitus with diabetic neuropathic arthropathy: Secondary | ICD-10-CM | POA: Diagnosis not present

## 2019-09-30 DIAGNOSIS — Z1389 Encounter for screening for other disorder: Secondary | ICD-10-CM | POA: Diagnosis not present

## 2019-10-08 DIAGNOSIS — Z79891 Long term (current) use of opiate analgesic: Secondary | ICD-10-CM | POA: Diagnosis not present

## 2019-10-08 DIAGNOSIS — R252 Cramp and spasm: Secondary | ICD-10-CM | POA: Diagnosis not present

## 2019-10-08 DIAGNOSIS — M545 Low back pain: Secondary | ICD-10-CM | POA: Diagnosis not present

## 2019-10-08 DIAGNOSIS — G4733 Obstructive sleep apnea (adult) (pediatric): Secondary | ICD-10-CM | POA: Diagnosis not present

## 2019-12-11 ENCOUNTER — Telehealth (INDEPENDENT_AMBULATORY_CARE_PROVIDER_SITE_OTHER): Payer: Self-pay | Admitting: *Deleted

## 2019-12-11 NOTE — Telephone Encounter (Signed)
Patient has been on Apriso. Insurance will not cover this. They have sent the following alternatives. Balsalazide, Mesalamine 1.1 gram , Sulfasalazne. Per Dr.Rehman patient may try Sulfasalazine 1 gram twice a day , he will need to take Folic Acid 1 mg daily. This was called to [facility]  A detailed message was left on the patient's voicemail.

## 2019-12-22 DIAGNOSIS — Z23 Encounter for immunization: Secondary | ICD-10-CM | POA: Diagnosis not present

## 2019-12-31 ENCOUNTER — Other Ambulatory Visit (HOSPITAL_COMMUNITY): Payer: Self-pay | Admitting: Neurology

## 2019-12-31 DIAGNOSIS — Z79891 Long term (current) use of opiate analgesic: Secondary | ICD-10-CM | POA: Diagnosis not present

## 2019-12-31 DIAGNOSIS — M545 Low back pain, unspecified: Secondary | ICD-10-CM

## 2019-12-31 DIAGNOSIS — G4733 Obstructive sleep apnea (adult) (pediatric): Secondary | ICD-10-CM | POA: Diagnosis not present

## 2019-12-31 DIAGNOSIS — R252 Cramp and spasm: Secondary | ICD-10-CM | POA: Diagnosis not present

## 2019-12-31 DIAGNOSIS — F419 Anxiety disorder, unspecified: Secondary | ICD-10-CM | POA: Diagnosis not present

## 2020-01-07 DIAGNOSIS — E11610 Type 2 diabetes mellitus with diabetic neuropathic arthropathy: Secondary | ICD-10-CM | POA: Diagnosis not present

## 2020-01-07 DIAGNOSIS — M109 Gout, unspecified: Secondary | ICD-10-CM | POA: Diagnosis not present

## 2020-01-07 DIAGNOSIS — I10 Essential (primary) hypertension: Secondary | ICD-10-CM | POA: Diagnosis not present

## 2020-01-07 DIAGNOSIS — Z0000 Encounter for general adult medical examination without abnormal findings: Secondary | ICD-10-CM | POA: Diagnosis not present

## 2020-01-07 DIAGNOSIS — Z23 Encounter for immunization: Secondary | ICD-10-CM | POA: Diagnosis not present

## 2020-01-07 DIAGNOSIS — Z6836 Body mass index (BMI) 36.0-36.9, adult: Secondary | ICD-10-CM | POA: Diagnosis not present

## 2020-02-02 ENCOUNTER — Other Ambulatory Visit: Payer: Self-pay

## 2020-02-02 ENCOUNTER — Ambulatory Visit (HOSPITAL_COMMUNITY)
Admission: RE | Admit: 2020-02-02 | Discharge: 2020-02-02 | Disposition: A | Discharge status: Home / Self Care | Payer: Medicare HMO | Source: Ambulatory Visit | Attending: Neurology | Admitting: Neurology

## 2020-02-02 DIAGNOSIS — M545 Low back pain, unspecified: Secondary | ICD-10-CM

## 2020-02-03 DIAGNOSIS — R42 Dizziness and giddiness: Secondary | ICD-10-CM | POA: Diagnosis not present

## 2020-02-03 DIAGNOSIS — H8112 Benign paroxysmal vertigo, left ear: Secondary | ICD-10-CM | POA: Diagnosis not present

## 2020-02-03 DIAGNOSIS — H903 Sensorineural hearing loss, bilateral: Secondary | ICD-10-CM | POA: Diagnosis not present

## 2020-02-03 DIAGNOSIS — H7202 Central perforation of tympanic membrane, left ear: Secondary | ICD-10-CM | POA: Diagnosis not present

## 2020-02-06 DIAGNOSIS — R252 Cramp and spasm: Secondary | ICD-10-CM | POA: Diagnosis not present

## 2020-02-06 DIAGNOSIS — F419 Anxiety disorder, unspecified: Secondary | ICD-10-CM | POA: Diagnosis not present

## 2020-02-06 DIAGNOSIS — Z79891 Long term (current) use of opiate analgesic: Secondary | ICD-10-CM | POA: Diagnosis not present

## 2020-02-06 DIAGNOSIS — M545 Low back pain: Secondary | ICD-10-CM | POA: Diagnosis not present

## 2020-02-06 DIAGNOSIS — G4733 Obstructive sleep apnea (adult) (pediatric): Secondary | ICD-10-CM | POA: Diagnosis not present

## 2020-03-05 DIAGNOSIS — H9312 Tinnitus, left ear: Secondary | ICD-10-CM | POA: Diagnosis not present

## 2020-03-05 DIAGNOSIS — H903 Sensorineural hearing loss, bilateral: Secondary | ICD-10-CM | POA: Diagnosis not present

## 2020-03-05 DIAGNOSIS — H6982 Other specified disorders of Eustachian tube, left ear: Secondary | ICD-10-CM | POA: Diagnosis not present

## 2020-03-05 DIAGNOSIS — R42 Dizziness and giddiness: Secondary | ICD-10-CM | POA: Diagnosis not present

## 2020-04-01 ENCOUNTER — Other Ambulatory Visit: Payer: Self-pay

## 2020-04-01 ENCOUNTER — Ambulatory Visit (INDEPENDENT_AMBULATORY_CARE_PROVIDER_SITE_OTHER): Payer: Medicare HMO | Admitting: Gastroenterology

## 2020-04-01 ENCOUNTER — Encounter (INDEPENDENT_AMBULATORY_CARE_PROVIDER_SITE_OTHER): Payer: Self-pay | Admitting: Gastroenterology

## 2020-04-01 DIAGNOSIS — K523 Indeterminate colitis: Secondary | ICD-10-CM | POA: Diagnosis not present

## 2020-04-01 MED ORDER — SULFASALAZINE 500 MG PO TABS: 1000.0000 mg | ORAL_TABLET | Freq: Two times a day (BID) | ORAL | 3 refills | Status: DC

## 2020-04-01 NOTE — Progress Notes (Signed)
Patient profile: Louis House is a 64 y.o. male seen for follow up of indeterminate colitis.   History of Present Illness: Louis House is seen today for follow up. He was on Apriso which was expensive, was switched to sulfasalazine and folic acid in April 0932.   He reports having usually having 1-2 BM/day. Denies any blood in stool. No abd pain. Feels bowels are doing well and medication change is working well.   Denies nausea/vomiting or GERD. He denies any esophageal dysphagia.   He takes diclofenac BID, sometimes before breakfast on empty stomach. No other OTC NSAIDs.   Denies alcohol currently. Non smoker.   Wt Readings from Last 3 Encounters:  06/19/19 272 lb 6.4 oz (123.6 kg)  06/17/18 295 lb (133.8 kg)  04/22/18 282 lb 13.6 oz (128.3 kg)     Last Colonoscopy: 2018-Colonoscopy 04/06/2017: - Congested mucosa in the ascending colon. Biopsied. - The rectum, sigmoid colon, descending colon, splenic flexure, transverse colon and hepatic flexure are normal.  Diagnosis 1. Colon, biopsy, ascending - COLONIC MUCOSA WITH ACUTE INFLAMMATION AND ULCERATION - SEE COMMENT 2. Colon, biopsy, sigmoid - COLONIC MUCOSA WITH ACUTE INFLAMMATION AND ULCERATION - SEE COMMENT    Last Endoscopy:    Past Medical History:  Past Medical History:  Diagnosis Date  . Anxiety   . Diabetes ([place]) 01/12/2017  . Diarrhea 02/21/2017  . Essential hypertension, benign 01/12/2017  . Gout   . High cholesterol 01/12/2017  . Neuropathy   . Sleep apnea   . Vertigo     Problem List: Patient Active Problem List   Diagnosis Date Noted  . 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 Inflammatory bowel disease 06/15/2017  . Diarrhea 02/21/2017  . Essential hypertension, benign 01/12/2017  . High cholesterol 01/12/2017  . Diabetes ([place]) 01/12/2017    Past Surgical History: Past Surgical History:  Procedure Laterality Date  . BIOPSY  04/06/2017   Procedure: BIOPSY;  Surgeon: Rogene Houston, MD;   [facility];  Service: Endoscopy;;  colon  . bone spurs    . CATARACT EXTRACTION W/PHACO Left 04/22/2018   Procedure: CATARACT EXTRACTION PHACO AND INTRAOCULAR LENS PLACEMENT LEFT EYE;  Surgeon: Tonny Branch, MD;  Location: [facility];  Service: Ophthalmology;  Laterality: Left;  CDE: 7.58  . COLONOSCOPY WITH PROPOFOL N/A 04/06/2017   Procedure: COLONOSCOPY WITH PROPOFOL;  Surgeon: Rogene Houston, MD;  [facility];  Service: Endoscopy;  Laterality: N/A;  1:45  . kneee arthroscopy     both knees  . Left shoulder surgery from injury    . Umblical hernia      Allergies: No Known Allergies    Home Medications:  Current Outpatient Medications:  .  allopurinol (ZYLOPRIM) 300 MG tablet, Take 300 mg by mouth daily., Disp: , Rfl:  .  atorvastatin (LIPITOR) 40 MG tablet, Take 40 mg by mouth daily., Disp: , Rfl:  .  benazepril (LOTENSIN) 40 MG tablet, Take 40 mg by mouth daily., Disp: , Rfl: 0 .  chlorthalidone (HYGROTON) 25 MG tablet, Take 25 mg by mouth daily., Disp: , Rfl:  .  diazepam (VALIUM) 10 MG tablet, Take 10 mg by mouth at bedtime. , Disp: , Rfl:  .  diclofenac (VOLTAREN) 75 MG EC tablet, Take 75 mg by mouth 2 (two) times daily., Disp: , Rfl:  .  dicyclomine (BENTYL) 20 MG tablet, TAKE ONE TABLET BY MOUTH FOUR TIMES DAILY, Disp: 120 tablet, Rfl: 2 .  fluticasone (FLONASE) 50 MCG/ACT  nasal spray, Place 1 spray into both nostrils daily., Disp: , Rfl:  .  gabapentin (NEURONTIN) 400 MG capsule, Take 400 mg by mouth 2 (two) times daily as needed (scheduled at bedtime & if needed during the day for pain.). , Disp: , Rfl:  .  HYDROcodone-acetaminophen (NORCO) 7.5-325 MG tablet, Take 1 tablet by mouth every 6 (six) hours as needed for moderate pain., Disp: , Rfl:  .  meclizine (ANTIVERT) 25 MG tablet, Take 25 mg by mouth 3 (three) times daily as needed for dizziness (for flare up of inner ear issues.). , Disp: , Rfl:  .  mesalamine (APRISO) 0.375 g 24 hr capsule, Take 1  capsule (0.375 g total) by mouth daily. Take 4 a day, Disp: 120 capsule, Rfl: 9 .  metoprolol succinate (TOPROL-XL) 100 MG 24 hr tablet, Take 100 mg by mouth daily. Take with or immediately following a meal., Disp: , Rfl:  .  Omega-3 Fatty Acids (FISH OIL) 1200 MG CAPS, Take 2,400 mg by mouth daily., Disp: , Rfl:  .  metFORMIN (GLUCOPHAGE) 500 MG tablet, Take 500 mg by mouth 2 (two) times daily with a meal. Morning & in the evening (Patient not taking: Reported on 04/01/2020), Disp: , Rfl:  .  tizanidine (ZANAFLEX) 2 MG capsule, Take 2 mg by mouth 3 (three) times daily as needed for muscle spasms.  (Patient not taking: Reported on 04/01/2020), Disp: , Rfl:   Current Facility-Administered Medications:  .  dicyclomine (BENTYL) tablet 20 mg, 20 mg, Oral, TID AC & HS, Setzer, Terri L, NP   Family History: family history is not on file.    Social History:   reports that he has never smoked. He has never used smokeless tobacco. He reports that he does not drink alcohol and does not use drugs.   Review of Systems: Constitutional: Denies weight loss/weight gain  Eyes: No changes in vision. ENT: No oral lesions, sore throat.  GI: see HPI.  Heme/Lymph: No easy bruising.  CV: No chest pain.  GU: No hematuria.  Integumentary: No rashes.  Neuro: No headaches.  Psych: No depression/anxiety.  Endocrine: No heat/cold intolerance.  Allergic/Immunologic: No urticaria.  Resp: No cough, SOB.  Musculoskeletal: No joint swelling.    Physical Examination: There were no vitals taken for this visit. Gen: NAD, alert and oriented x 4 HEENT: PEERLA, EOMI, Neck: supple, no JVD Chest: CTA bilaterally, no wheezes, crackles, or other adventitious sounds CV: RRR, no m/g/c/r Abd: soft, NT, ND, +BS in all four quadrants; no HSM, guarding, ridigity, or rebound tenderness Ext: no edema, well perfused with 2+ pulses, Skin: no rash or lesions noted on observed skin Lymph: no noted LAD  Data Reviewed:   06/2019  - celiac panel negative   Assessment/Plan: Mr. Gholson is a 64 y.o. male seen for follow up  1. Hx of nonspecific colitis - endoscopic appearance not consistent w/ UC or crohn's but has relapsed when off mesalamine therapy in past. Will continue sulfasalazine and folic acid which is working well (switched from apriso due to cost). He will contact us w/ any return of symptoms. He gets labs every 3 months w/ PCP and have requested a copy of most recent.      There are no diagnoses linked to this encounter.   Recommendations:   I personally performed the service, non-incident to. (WP)  Laurine Blazer, [facility]

## 2020-04-01 NOTE — Patient Instructions (Addendum)
Continue current medications - Please contact us with any new or worsening symptoms abdominal pain, diarrhea, rectal bleeding.

## 2020-04-02 DIAGNOSIS — G4733 Obstructive sleep apnea (adult) (pediatric): Secondary | ICD-10-CM | POA: Diagnosis not present

## 2020-04-02 DIAGNOSIS — Z79891 Long term (current) use of opiate analgesic: Secondary | ICD-10-CM | POA: Diagnosis not present

## 2020-04-02 DIAGNOSIS — M545 Low back pain: Secondary | ICD-10-CM | POA: Diagnosis not present

## 2020-04-02 DIAGNOSIS — R252 Cramp and spasm: Secondary | ICD-10-CM | POA: Diagnosis not present

## 2020-04-02 DIAGNOSIS — M5416 Radiculopathy, lumbar region: Secondary | ICD-10-CM | POA: Diagnosis not present

## 2020-04-02 DIAGNOSIS — F419 Anxiety disorder, unspecified: Secondary | ICD-10-CM | POA: Diagnosis not present

## 2020-04-07 ENCOUNTER — Other Ambulatory Visit: Payer: Self-pay | Admitting: Neurology

## 2020-04-07 ENCOUNTER — Other Ambulatory Visit (HOSPITAL_COMMUNITY): Payer: Self-pay | Admitting: Neurology

## 2020-04-07 DIAGNOSIS — E11610 Type 2 diabetes mellitus with diabetic neuropathic arthropathy: Secondary | ICD-10-CM | POA: Diagnosis not present

## 2020-04-07 DIAGNOSIS — Z6838 Body mass index (BMI) 38.0-38.9, adult: Secondary | ICD-10-CM | POA: Diagnosis not present

## 2020-04-07 DIAGNOSIS — I10 Essential (primary) hypertension: Secondary | ICD-10-CM | POA: Diagnosis not present

## 2020-04-07 DIAGNOSIS — M5416 Radiculopathy, lumbar region: Secondary | ICD-10-CM

## 2020-04-07 DIAGNOSIS — Z0000 Encounter for general adult medical examination without abnormal findings: Secondary | ICD-10-CM | POA: Diagnosis not present

## 2020-04-07 DIAGNOSIS — M109 Gout, unspecified: Secondary | ICD-10-CM | POA: Diagnosis not present

## 2020-04-14 DIAGNOSIS — M5416 Radiculopathy, lumbar region: Secondary | ICD-10-CM | POA: Diagnosis not present

## 2020-05-04 ENCOUNTER — Other Ambulatory Visit (INDEPENDENT_AMBULATORY_CARE_PROVIDER_SITE_OTHER): Payer: Self-pay | Admitting: Internal Medicine

## 2020-05-14 DIAGNOSIS — Z79891 Long term (current) use of opiate analgesic: Secondary | ICD-10-CM | POA: Diagnosis not present

## 2020-05-14 DIAGNOSIS — F419 Anxiety disorder, unspecified: Secondary | ICD-10-CM | POA: Diagnosis not present

## 2020-05-14 DIAGNOSIS — M545 Low back pain: Secondary | ICD-10-CM | POA: Diagnosis not present

## 2020-05-14 DIAGNOSIS — R252 Cramp and spasm: Secondary | ICD-10-CM | POA: Diagnosis not present

## 2020-05-14 DIAGNOSIS — M5416 Radiculopathy, lumbar region: Secondary | ICD-10-CM | POA: Diagnosis not present

## 2020-05-14 DIAGNOSIS — G4733 Obstructive sleep apnea (adult) (pediatric): Secondary | ICD-10-CM | POA: Diagnosis not present

## 2020-05-18 ENCOUNTER — Other Ambulatory Visit (HOSPITAL_COMMUNITY): Payer: Self-pay | Admitting: Neurology

## 2020-05-18 DIAGNOSIS — M5416 Radiculopathy, lumbar region: Secondary | ICD-10-CM

## 2020-06-10 ENCOUNTER — Ambulatory Visit: Payer: Medicare HMO

## 2020-06-18 ENCOUNTER — Encounter (HOSPITAL_COMMUNITY): Payer: Self-pay

## 2020-06-18 ENCOUNTER — Ambulatory Visit (HOSPITAL_COMMUNITY): Payer: Medicare HMO

## 2020-06-22 ENCOUNTER — Telehealth: Payer: Self-pay | Admitting: Neurology

## 2020-06-23 ENCOUNTER — Other Ambulatory Visit: Payer: Medicare HMO

## 2020-06-23 ENCOUNTER — Ambulatory Visit (HOSPITAL_COMMUNITY): Payer: Medicare HMO

## 2020-07-08 DIAGNOSIS — M109 Gout, unspecified: Secondary | ICD-10-CM | POA: Diagnosis not present

## 2020-07-08 DIAGNOSIS — E11610 Type 2 diabetes mellitus with diabetic neuropathic arthropathy: Secondary | ICD-10-CM | POA: Diagnosis not present

## 2020-07-08 DIAGNOSIS — Z6841 Body Mass Index (BMI) 40.0 and over, adult: Secondary | ICD-10-CM | POA: Diagnosis not present

## 2020-07-08 DIAGNOSIS — I10 Essential (primary) hypertension: Secondary | ICD-10-CM | POA: Diagnosis not present

## 2020-07-12 ENCOUNTER — Other Ambulatory Visit (HOSPITAL_COMMUNITY): Payer: Self-pay | Admitting: Neurology

## 2020-07-12 DIAGNOSIS — M5416 Radiculopathy, lumbar region: Secondary | ICD-10-CM | POA: Diagnosis not present

## 2020-07-12 DIAGNOSIS — F419 Anxiety disorder, unspecified: Secondary | ICD-10-CM | POA: Diagnosis not present

## 2020-07-12 DIAGNOSIS — Z79891 Long term (current) use of opiate analgesic: Secondary | ICD-10-CM | POA: Diagnosis not present

## 2020-07-12 DIAGNOSIS — G4733 Obstructive sleep apnea (adult) (pediatric): Secondary | ICD-10-CM | POA: Diagnosis not present

## 2020-07-12 DIAGNOSIS — R252 Cramp and spasm: Secondary | ICD-10-CM | POA: Diagnosis not present

## 2020-07-12 DIAGNOSIS — M545 Low back pain, unspecified: Secondary | ICD-10-CM | POA: Diagnosis not present

## 2020-08-13 ENCOUNTER — Other Ambulatory Visit: Payer: Self-pay

## 2020-08-13 ENCOUNTER — Ambulatory Visit
Admission: RE | Admit: 2020-08-13 | Discharge: 2020-08-13 | Disposition: A | Discharge status: Home / Self Care | Payer: Medicare HMO | Source: Ambulatory Visit | Attending: Neurology | Admitting: Neurology

## 2020-08-13 DIAGNOSIS — M5416 Radiculopathy, lumbar region: Secondary | ICD-10-CM

## 2020-08-13 DIAGNOSIS — M5126 Other intervertebral disc displacement, lumbar region: Secondary | ICD-10-CM | POA: Diagnosis not present

## 2020-09-24 DIAGNOSIS — Z961 Presence of intraocular lens: Secondary | ICD-10-CM | POA: Diagnosis not present

## 2020-09-24 DIAGNOSIS — H524 Presbyopia: Secondary | ICD-10-CM | POA: Diagnosis not present

## 2020-09-24 DIAGNOSIS — H26492 Other secondary cataract, left eye: Secondary | ICD-10-CM | POA: Diagnosis not present

## 2020-09-24 DIAGNOSIS — H35033 Hypertensive retinopathy, bilateral: Secondary | ICD-10-CM | POA: Diagnosis not present

## 2020-09-24 DIAGNOSIS — H2511 Age-related nuclear cataract, right eye: Secondary | ICD-10-CM | POA: Diagnosis not present

## 2020-09-24 DIAGNOSIS — Z7984 Long term (current) use of oral hypoglycemic drugs: Secondary | ICD-10-CM | POA: Diagnosis not present

## 2020-09-24 DIAGNOSIS — I10 Essential (primary) hypertension: Secondary | ICD-10-CM | POA: Diagnosis not present

## 2020-09-24 DIAGNOSIS — E119 Type 2 diabetes mellitus without complications: Secondary | ICD-10-CM | POA: Diagnosis not present

## 2020-10-04 ENCOUNTER — Other Ambulatory Visit: Payer: Self-pay | Admitting: Neurology

## 2020-10-04 DIAGNOSIS — Z79891 Long term (current) use of opiate analgesic: Secondary | ICD-10-CM | POA: Diagnosis not present

## 2020-10-04 DIAGNOSIS — F419 Anxiety disorder, unspecified: Secondary | ICD-10-CM | POA: Diagnosis not present

## 2020-10-04 DIAGNOSIS — G4733 Obstructive sleep apnea (adult) (pediatric): Secondary | ICD-10-CM | POA: Diagnosis not present

## 2020-10-04 DIAGNOSIS — M5416 Radiculopathy, lumbar region: Secondary | ICD-10-CM

## 2020-10-04 DIAGNOSIS — M545 Low back pain, unspecified: Secondary | ICD-10-CM | POA: Diagnosis not present

## 2020-10-04 DIAGNOSIS — R252 Cramp and spasm: Secondary | ICD-10-CM | POA: Diagnosis not present

## 2020-10-11 DIAGNOSIS — Z6839 Body mass index (BMI) 39.0-39.9, adult: Secondary | ICD-10-CM | POA: Diagnosis not present

## 2020-10-11 DIAGNOSIS — M109 Gout, unspecified: Secondary | ICD-10-CM | POA: Diagnosis not present

## 2020-10-11 DIAGNOSIS — E11610 Type 2 diabetes mellitus with diabetic neuropathic arthropathy: Secondary | ICD-10-CM | POA: Diagnosis not present

## 2020-10-11 DIAGNOSIS — I10 Essential (primary) hypertension: Secondary | ICD-10-CM | POA: Diagnosis not present

## 2020-10-11 DIAGNOSIS — Z125 Encounter for screening for malignant neoplasm of prostate: Secondary | ICD-10-CM | POA: Diagnosis not present

## 2020-10-19 ENCOUNTER — Inpatient Hospital Stay: Admission: RE | Admit: 2020-10-19 | Payer: Medicare HMO | Source: Ambulatory Visit

## 2020-10-26 ENCOUNTER — Inpatient Hospital Stay: Admission: RE | Admit: 2020-10-26 | Payer: Medicare HMO | Source: Ambulatory Visit

## 2020-11-01 ENCOUNTER — Other Ambulatory Visit (INDEPENDENT_AMBULATORY_CARE_PROVIDER_SITE_OTHER): Payer: Self-pay | Admitting: Internal Medicine

## 2020-11-02 ENCOUNTER — Other Ambulatory Visit: Payer: Medicare HMO

## 2020-11-09 ENCOUNTER — Inpatient Hospital Stay
Admission: RE | Admit: 2020-11-09 | Discharge: 2020-11-09 | Disposition: A | Discharge status: Home / Self Care | Payer: Medicare HMO | Source: Ambulatory Visit | Attending: Neurology | Admitting: Neurology

## 2020-11-09 NOTE — Discharge Instructions (Signed)

## 2020-11-17 DIAGNOSIS — R69 Illness, unspecified: Secondary | ICD-10-CM | POA: Diagnosis not present

## 2020-11-23 ENCOUNTER — Ambulatory Visit
Admission: RE | Admit: 2020-11-23 | Discharge: 2020-11-23 | Disposition: A | Discharge status: Home / Self Care | Payer: Medicare HMO | Source: Ambulatory Visit | Attending: Neurology | Admitting: Neurology

## 2020-11-23 DIAGNOSIS — M47816 Spondylosis without myelopathy or radiculopathy, lumbar region: Secondary | ICD-10-CM | POA: Diagnosis not present

## 2020-11-23 DIAGNOSIS — M5416 Radiculopathy, lumbar region: Secondary | ICD-10-CM

## 2020-11-23 DIAGNOSIS — M47817 Spondylosis without myelopathy or radiculopathy, lumbosacral region: Secondary | ICD-10-CM | POA: Diagnosis not present

## 2020-11-23 MED ORDER — IOPAMIDOL (ISOVUE-M 200) INJECTION 41%
1.0000 mL | Freq: Once | INTRAMUSCULAR | Status: AC
  Administered 2020-11-23: 1 mL via EPIDURAL

## 2020-11-23 MED ORDER — METHYLPREDNISOLONE ACETATE 40 MG/ML INJ SUSP (RADIOLOG
120.0000 mg | Freq: Once | INTRAMUSCULAR | Status: AC
  Administered 2020-11-23: 120 mg via EPIDURAL

## 2020-11-23 NOTE — Discharge Instructions (Signed)

## 2020-11-29 DIAGNOSIS — F419 Anxiety disorder, unspecified: Secondary | ICD-10-CM | POA: Diagnosis not present

## 2020-11-29 DIAGNOSIS — G4733 Obstructive sleep apnea (adult) (pediatric): Secondary | ICD-10-CM | POA: Diagnosis not present

## 2020-11-29 DIAGNOSIS — R252 Cramp and spasm: Secondary | ICD-10-CM | POA: Diagnosis not present

## 2020-11-29 DIAGNOSIS — M5416 Radiculopathy, lumbar region: Secondary | ICD-10-CM | POA: Diagnosis not present

## 2020-11-29 DIAGNOSIS — Z79891 Long term (current) use of opiate analgesic: Secondary | ICD-10-CM | POA: Diagnosis not present

## 2020-11-29 DIAGNOSIS — M545 Low back pain, unspecified: Secondary | ICD-10-CM | POA: Diagnosis not present

## 2020-12-13 ENCOUNTER — Other Ambulatory Visit: Payer: Self-pay | Admitting: Neurology

## 2020-12-13 DIAGNOSIS — M5416 Radiculopathy, lumbar region: Secondary | ICD-10-CM

## 2021-01-04 ENCOUNTER — Ambulatory Visit
Admission: RE | Admit: 2021-01-04 | Discharge: 2021-01-04 | Disposition: A | Discharge status: Home / Self Care | Payer: Medicare Other | Source: Ambulatory Visit | Attending: Neurology | Admitting: Neurology

## 2021-01-04 ENCOUNTER — Other Ambulatory Visit: Payer: Self-pay | Admitting: Neurology

## 2021-01-04 ENCOUNTER — Other Ambulatory Visit: Payer: Self-pay

## 2021-01-04 DIAGNOSIS — M5416 Radiculopathy, lumbar region: Secondary | ICD-10-CM

## 2021-01-04 MED ORDER — IOPAMIDOL (ISOVUE-M 200) INJECTION 41%
1.0000 mL | Freq: Once | INTRAMUSCULAR | Status: AC
  Administered 2021-01-04: 1 mL via EPIDURAL

## 2021-01-04 MED ORDER — METHYLPREDNISOLONE ACETATE 40 MG/ML INJ SUSP (RADIOLOG
80.0000 mg | Freq: Once | INTRAMUSCULAR | Status: AC
  Administered 2021-01-04: 80 mg via EPIDURAL

## 2021-01-04 NOTE — Discharge Instructions (Signed)

## 2021-01-11 ENCOUNTER — Telehealth (INDEPENDENT_AMBULATORY_CARE_PROVIDER_SITE_OTHER): Payer: Self-pay | Admitting: Gastroenterology

## 2021-01-11 ENCOUNTER — Other Ambulatory Visit (INDEPENDENT_AMBULATORY_CARE_PROVIDER_SITE_OTHER): Payer: Self-pay

## 2021-01-11 DIAGNOSIS — E11610 Type 2 diabetes mellitus with diabetic neuropathic arthropathy: Secondary | ICD-10-CM | POA: Diagnosis not present

## 2021-01-11 DIAGNOSIS — K523 Indeterminate colitis: Secondary | ICD-10-CM

## 2021-01-11 DIAGNOSIS — I10 Essential (primary) hypertension: Secondary | ICD-10-CM

## 2021-01-11 DIAGNOSIS — K529 Noninfective gastroenteritis and colitis, unspecified: Secondary | ICD-10-CM

## 2021-01-11 DIAGNOSIS — Z0000 Encounter for general adult medical examination without abnormal findings: Secondary | ICD-10-CM | POA: Diagnosis not present

## 2021-01-11 DIAGNOSIS — M109 Gout, unspecified: Secondary | ICD-10-CM | POA: Diagnosis not present

## 2021-01-11 DIAGNOSIS — Z1331 Encounter for screening for depression: Secondary | ICD-10-CM | POA: Diagnosis not present

## 2021-01-11 MED ORDER — FOLIC ACID 1 MG PO TABS
1.0000 mg | ORAL_TABLET | Freq: Every day | ORAL | 0 refills | Status: DC
Start: 2021-01-11 — End: 2021-04-12

## 2021-01-11 NOTE — Telephone Encounter (Signed)
I called and left a vm asked that he please let me know which pharmacy he is using so we can send in the requested medication.

## 2021-01-11 NOTE — Telephone Encounter (Signed)
Patient aware we have transferred his folic acid over to [facility].

## 2021-01-11 NOTE — Telephone Encounter (Signed)
Patient left voice mail message stating he has changed pharmacy's and needs a refill on folic acid - please advise ph# [phone]

## 2021-01-14 ENCOUNTER — Telehealth (INDEPENDENT_AMBULATORY_CARE_PROVIDER_SITE_OTHER): Payer: Self-pay

## 2021-01-14 NOTE — Telephone Encounter (Signed)
I called the patient and left patient a vm to make him aware that the insurance is no longer wanting to cover the Folic Acid. He will now need to get this otc.   Per [employer] the folic acid is no longer on patient formulary.

## 2021-01-17 NOTE — Telephone Encounter (Signed)
I called and left a detailed message that the medication is not on formulary in longer he will have to buy otc. I asked that he please return my call to let me know he received the message.

## 2021-01-18 NOTE — Telephone Encounter (Signed)
I called and left a detailed message again that the folic acid no longer covered on his insurance he will have to buy this over the counter.

## 2021-02-10 ENCOUNTER — Encounter (INDEPENDENT_AMBULATORY_CARE_PROVIDER_SITE_OTHER): Payer: Self-pay | Admitting: Gastroenterology

## 2021-02-10 ENCOUNTER — Other Ambulatory Visit: Payer: Self-pay

## 2021-02-10 ENCOUNTER — Ambulatory Visit (INDEPENDENT_AMBULATORY_CARE_PROVIDER_SITE_OTHER): Payer: Medicare Other | Admitting: Gastroenterology

## 2021-02-10 VITALS — BP 114/75 | HR 67 | Temp 97.8°F | Ht 73.0 in | Wt 281.0 lb

## 2021-02-10 DIAGNOSIS — K523 Indeterminate colitis: Secondary | ICD-10-CM

## 2021-02-10 DIAGNOSIS — R197 Diarrhea, unspecified: Secondary | ICD-10-CM | POA: Diagnosis not present

## 2021-02-10 NOTE — Progress Notes (Signed)
Louis House, M.D. Gastroenterology & Hepatology [facility] [address] [place], [place] [address]  Primary Care Physician: Neale Burly, MD [place] [address]  I will communicate my assessment and recommendations to the referring MD via EMR.  Problems: 1. History of indeterminate colitis 2. Recurrent diarrhea  History of Present Illness: Louis House is a 65 y.o. male with past medical history of indeterminate colitis, diabetes, hypertension, gout, hyperlipidemia and anxiety, who presents for evaluation of recurrent diarrhea.  Back in 2018 had new onset of diarrhea which was evaluated with a colonoscopy. Colonoscopy showed presence of congestion in the ascending colon.  Biopsies showed presence of colitis and focal ulceration with no alterations in the architectural conformation and no chronic changes.  Esophagogastroduodenospy for the patient was started on Apriso which admitted to the improvement of his symptoms.  Due to high cost, the patient was subsequently switched to sulfasalazine which he is currently taking.  Patient reports that 4 weeks ago he presented recurrent watery bowel movements, between 3-5 times per day. He reports that he was having regular bowel movements every day before this started, usually 1-2 BMs with normal consistency. States his stools are currently mushy and loose but denies any melena or hematochezia. He has flatulence frequently. He reports he initially was having some lower abdominal pain, but this improved after he changed his diet. He has presented urgency to have bowel movements and some episodes of fecal soiling. He has tried using Imodium and colestipol 1 g every 6 hours but did not have any improvement. Has lost 3 lb since his symptoms started.   He has been compliant taking the sulfasalazine 2 g qday and folic acid.  He states he has tried changing some of his diet to  a more bland and tolerable diet, as well as some rehydration oral solution to maintain hydrated. He has avoided eating any dairy.  The patient denies having any nausea, vomiting, fever, chills, hematochezia, melena, hematemesis, jaundice, pruritus.  Last Colonoscopy: as above  Past Medical History: Past Medical History:  Diagnosis Date  . Anxiety   . Diabetes ([place]) 01/12/2017  . Diarrhea 02/21/2017  . Essential hypertension, benign 01/12/2017  . Gout   . High cholesterol 01/12/2017  . Neuropathy   . Sleep apnea   . Vertigo     Past Surgical History: Past Surgical History:  Procedure Laterality Date  . BIOPSY  04/06/2017   Procedure: BIOPSY;  Surgeon: Rogene Houston, MD;  Location: [facility];  Service: Endoscopy;;  colon  . bone spurs    . CATARACT EXTRACTION W/PHACO Left 04/22/2018   Procedure: CATARACT EXTRACTION PHACO AND INTRAOCULAR LENS PLACEMENT LEFT EYE;  Surgeon: Tonny Branch, MD;  Location: [facility] ORS;  Service: Ophthalmology;  Laterality: Left;  CDE: 7.58  . COLONOSCOPY WITH PROPOFOL N/A 04/06/2017   Procedure: COLONOSCOPY WITH PROPOFOL;  Surgeon: Rogene Houston, MD;  Location: [facility];  Service: Endoscopy;  Laterality: N/A;  1:45  . kneee arthroscopy     both knees  . Left shoulder surgery from injury    . Umblical hernia      Family History:History reviewed. No pertinent family history.  Social History: Social History   Tobacco Use  Smoking Status Never Smoker  Smokeless Tobacco Never Used   Social History   Substance and Sexual Activity  Alcohol Use No   Social History   Substance and Sexual Activity  Drug Use No  Allergies: No Known Allergies  Medications: Current Outpatient Medications  Medication Sig Dispense Refill  . allopurinol (ZYLOPRIM) 300 MG tablet Take 300 mg by mouth daily.    Marland Kitchen atorvastatin (LIPITOR) 40 MG tablet Take 40 mg by mouth daily.    . benazepril (LOTENSIN) 40 MG tablet Take 40 mg by mouth daily.  0  . chlorthalidone  (HYGROTON) 25 MG tablet Take 50 mg by mouth daily.    . diazepam (VALIUM) 10 MG tablet Take 10 mg by mouth at bedtime.     . diclofenac (VOLTAREN) 75 MG EC tablet Take 75 mg by mouth 2 (two) times daily.    . DULoxetine (CYMBALTA) 30 MG capsule Take 30 mg by mouth daily.    . fluticasone (FLONASE) 50 MCG/ACT nasal spray Place 1 spray into both nostrils daily.    . folic acid (FOLVITE) 1 MG tablet Take 1 tablet (1 mg total) by mouth daily. 90 tablet 0  . gabapentin (NEURONTIN) 400 MG capsule Take 400 mg by mouth 2 (two) times daily as needed (scheduled at bedtime & if needed during the day for pain.).     Marland Kitchen HYDROcodone-acetaminophen (NORCO) 10-325 MG tablet Take 1 tablet by mouth every 6 (six) hours as needed for moderate pain.    . meclizine (ANTIVERT) 25 MG tablet Take 25 mg by mouth 3 (three) times daily as needed for dizziness (for flare up of inner ear issues.).     Marland Kitchen metoprolol succinate (TOPROL-XL) 100 MG 24 hr tablet Take 100 mg by mouth daily. Take with or immediately following a meal.    . Omega-3 Fatty Acids (FISH OIL) 1200 MG CAPS Take 2,400 mg by mouth daily.    Marland Kitchen sulfaSALAzine (AZULFIDINE) 500 MG tablet Take 2 tablets (1,000 mg total) by mouth 2 (two) times daily. 360 tablet 3  . tizanidine (ZANAFLEX) 2 MG capsule Take 2 mg by mouth 3 (three) times daily as needed for muscle spasms.    . traZODone (DESYREL) 50 MG tablet Take 50 mg by mouth at bedtime.    . mesalamine (APRISO) 0.375 g 24 hr capsule Take 1 capsule (0.375 g total) by mouth daily. Take 4 a day (Patient not taking: Reported on 02/10/2021) 120 capsule 9   Current Facility-Administered Medications  Medication Dose Route Frequency Provider Last Rate Last Admin  . dicyclomine (BENTYL) tablet 20 mg  20 mg Oral TID AC & HS Setzer, Terri L, NP        Review of Systems: GENERAL: negative for malaise, night sweats HEENT: No changes in hearing or vision, no nose bleeds or other nasal problems. NECK: Negative for lumps, goiter,  pain and significant neck swelling RESPIRATORY: Negative for cough, wheezing CARDIOVASCULAR: Negative for chest pain, leg swelling, palpitations, orthopnea GI: SEE HPI MUSCULOSKELETAL: Negative for joint pain or swelling, back pain, and muscle pain. SKIN: Negative for lesions, rash PSYCH: Negative for sleep disturbance, mood disorder and recent psychosocial stressors. HEMATOLOGY Negative for prolonged bleeding, bruising easily, and swollen nodes. ENDOCRINE: Negative for cold or heat intolerance, polyuria, polydipsia and goiter. NEURO: negative for tremor, gait imbalance, syncope and seizures. The remainder of the review of systems is noncontributory.   Physical Exam: BP 114/75 (BP Location: Left Arm, Patient Position: Sitting, Cuff Size: Large)   Pulse 67   Temp 97.8 F (36.6 C) (Oral)   Ht 6' 1"  (1.854 m)   Wt 281 lb (127.5 kg)   BMI 37.07 kg/m  GENERAL: The patient is AO x3, in no acute distress.  Obese  HEENT: Head is normocephalic and atraumatic. EOMI are intact. Mouth is well hydrated and without lesions. NECK: Supple. No masses LUNGS: Clear to auscultation. No presence of rhonchi/wheezing/rales. Adequate chest expansion HEART: RRR, normal s1 and s2. ABDOMEN: Soft, nontender, no guarding, no peritoneal signs, and nondistended. BS +. No masses. EXTREMITIES: Without any cyanosis, clubbing, rash, lesions or edema. NEUROLOGIC: AOx3, no focal motor deficit. SKIN: no jaundice, no rashes  Imaging/Labs: as above  I personally reviewed and interpreted the available labs, imaging and endoscopic files.  Impression and Plan: Louis House is a 65 y.o. male with past medical history of indeterminate colitis, diabetes, hypertension, gout, hyperlipidemia and anxiety, who presents for evaluation of recurrent diarrhea.  The patient has recently developed recurrent diarrhea of unclear etiology.  Given the new onset of symptoms and the adequate control of his colitis with sulfasalazine,  will need to first rule out infectious causes, will check a CBC, CMP, CRP, C. difficile and GI pathogen panel, as well as a fecal calprotectin.  I am not entirely sure if he indeed has IBD as he did not have any chronic architectural changes in his biopsies which is not typical for IBD.  For now he can continue taking the sulfasalazine and folic acid as he had improvement of his symptoms on these medications and he has tolerated well.  If his investigations are inconclusive, we will need to proceed with a repeat colonoscopy.  Patient understood and agreed.  - CBC, CMP, CRP, C. difficile and GI pathogen panel, fecal calprotectin. - Continue sulfasalazine 1 g twice a day - Continue folic acid supplementation  All questions were answered.      Harvel Quale, MD Gastroenterology and Hepatology [facility]

## 2021-02-10 NOTE — Patient Instructions (Signed)
Perform blood workup Perform stool workup Continue sulfasalazine 1 g twice a day Continue folic acid supplementation

## 2021-02-11 LAB — COMPREHENSIVE METABOLIC PANEL
AG Ratio: 2.1 (calc) (ref 1.0–2.5)
ALT: 37 U/L (ref 9–46)
AST: 28 U/L (ref 10–35)
Albumin: 4.2 g/dL (ref 3.6–5.1)
Alkaline phosphatase (APISO): 48 U/L (ref 35–144)
BUN: 15 mg/dL (ref 7–25)
CO2: 34 mmol/L — ABNORMAL HIGH (ref 20–32)
Calcium: 10 mg/dL (ref 8.6–10.3)
Chloride: 94 mmol/L — ABNORMAL LOW (ref 98–110)
Creat: 1.21 mg/dL (ref 0.70–1.25)
Globulin: 2 g/dL (calc) (ref 1.9–3.7)
Glucose, Bld: 93 mg/dL (ref 65–139)
Potassium: 4.5 mmol/L (ref 3.5–5.3)
Sodium: 134 mmol/L — ABNORMAL LOW (ref 135–146)
Total Bilirubin: 0.4 mg/dL (ref 0.2–1.2)
Total Protein: 6.2 g/dL (ref 6.1–8.1)

## 2021-02-11 LAB — C-REACTIVE PROTEIN: CRP: 5.7 mg/L (ref <8.0)

## 2021-02-11 LAB — CBC WITH DIFFERENTIAL/PLATELET
Absolute Monocytes: 739 cells/uL (ref 200–950)
Basophils Absolute: 73 cells/uL (ref 0–200)
Basophils Relative: 1.1 %
Eosinophils Absolute: 191 cells/uL (ref 15–500)
Eosinophils Relative: 2.9 %
HCT: 33.2 % — ABNORMAL LOW (ref 38.5–50.0)
Hemoglobin: 11 g/dL — ABNORMAL LOW (ref 13.2–17.1)
Lymphs Abs: 1445 cells/uL (ref 850–3900)
MCH: 30.7 pg (ref 27.0–33.0)
MCHC: 33.1 g/dL (ref 32.0–36.0)
MCV: 92.7 fL (ref 80.0–100.0)
MPV: 8.9 fL (ref 7.5–12.5)
Monocytes Relative: 11.2 %
Neutro Abs: 4151 cells/uL (ref 1500–7800)
Neutrophils Relative %: 62.9 %
Platelets: 274 10*3/uL (ref 140–400)
RBC: 3.58 10*6/uL — ABNORMAL LOW (ref 4.20–5.80)
RDW: 13.8 % (ref 11.0–15.0)
Total Lymphocyte: 21.9 %
WBC: 6.6 10*3/uL (ref 3.8–10.8)

## 2021-02-15 ENCOUNTER — Telehealth (INDEPENDENT_AMBULATORY_CARE_PROVIDER_SITE_OTHER): Payer: Self-pay | Admitting: Gastroenterology

## 2021-02-15 ENCOUNTER — Other Ambulatory Visit (INDEPENDENT_AMBULATORY_CARE_PROVIDER_SITE_OTHER): Payer: Self-pay | Admitting: Gastroenterology

## 2021-02-15 NOTE — Telephone Encounter (Signed)
Patient called the office regarding lab results  -  Please advise - ph# [phone]

## 2021-02-15 NOTE — Telephone Encounter (Signed)
Called patient to discuss the results of his recent blood work-up and stool testing which showed mild anemia of 11.0 but normal CBC otherwise, normal CMP, normal CRP, negative C. difficile in stool and normal GI pathogen panel.  Fecal calprotectin test is still pending.  However, the patient did not pick up the phone and I left a detailed voice message with the results.  Given the persistence of his symptoms, we will need to explore further with a colonoscopy.  Louis House, can you please schedule a colonoscopy? Dx: Diarrhea, history of colitis. Room: 1  Thanks,  [name], MD Gastroenterology and Hepatology [facility]

## 2021-02-17 LAB — C. DIFFICILE GDH AND TOXIN A/B
GDH ANTIGEN: NOT DETECTED
MICRO NUMBER:: 11967182
SPECIMEN QUALITY:: ADEQUATE
TOXIN A AND B: NOT DETECTED

## 2021-02-17 LAB — GASTROINTESTINAL PATHOGEN PANEL PCR
C. difficile Tox A/B, PCR: NOT DETECTED
Campylobacter, PCR: NOT DETECTED
Cryptosporidium, PCR: NOT DETECTED
E coli (ETEC) LT/ST PCR: NOT DETECTED
E coli (STEC) stx1/stx2, PCR: NOT DETECTED
E coli 0157, PCR: NOT DETECTED
Giardia lamblia, PCR: NOT DETECTED
Norovirus, PCR: NOT DETECTED
Rotavirus A, PCR: NOT DETECTED
Salmonella, PCR: NOT DETECTED
Shigella, PCR: NOT DETECTED

## 2021-02-17 LAB — CALPROTECTIN: Calprotectin: 1110 mcg/g — ABNORMAL HIGH

## 2021-02-17 NOTE — Telephone Encounter (Signed)
Ok, thanks, if he calls back please schedule him

## 2021-02-17 NOTE — Telephone Encounter (Signed)
I have tried calling and I left voicemail for a returned call

## 2021-02-21 ENCOUNTER — Encounter (INDEPENDENT_AMBULATORY_CARE_PROVIDER_SITE_OTHER): Payer: Self-pay

## 2021-02-21 ENCOUNTER — Telehealth (INDEPENDENT_AMBULATORY_CARE_PROVIDER_SITE_OTHER): Payer: Self-pay

## 2021-02-21 ENCOUNTER — Other Ambulatory Visit (INDEPENDENT_AMBULATORY_CARE_PROVIDER_SITE_OTHER): Payer: Self-pay

## 2021-02-21 MED ORDER — PEG 3350-KCL-NA BICARB-NACL 420 G PO SOLR: 4000.0000 mL | ORAL | 0 refills | Status: DC

## 2021-02-21 NOTE — Telephone Encounter (Signed)
Louis House, CMA  

## 2021-02-22 NOTE — Telephone Encounter (Signed)
Louis House is scheduled on 03/15/21 and he is aware

## 2021-02-22 NOTE — Telephone Encounter (Signed)
Thanks

## 2021-03-14 ENCOUNTER — Encounter (HOSPITAL_COMMUNITY): Payer: Self-pay | Admitting: Anesthesiology

## 2021-03-15 ENCOUNTER — Encounter (HOSPITAL_COMMUNITY): Admission: RE | Payer: Self-pay | Source: Home / Self Care

## 2021-03-15 ENCOUNTER — Encounter (HOSPITAL_COMMUNITY): Payer: Self-pay | Admitting: Gastroenterology

## 2021-03-15 SURGERY — COLONOSCOPY WITH PROPOFOL
Anesthesia: Monitor Anesthesia Care

## 2021-03-16 ENCOUNTER — Telehealth (INDEPENDENT_AMBULATORY_CARE_PROVIDER_SITE_OTHER): Payer: Self-pay

## 2021-03-16 ENCOUNTER — Encounter (INDEPENDENT_AMBULATORY_CARE_PROVIDER_SITE_OTHER): Payer: Self-pay

## 2021-03-16 MED ORDER — PEG 3350-KCL-NA BICARB-NACL 420 G PO SOLR
4000.0000 mL | ORAL | 0 refills | Status: DC
Start: 2021-03-16 — End: 2021-04-12

## 2021-03-16 NOTE — Telephone Encounter (Signed)
Louis House, CMA  

## 2021-03-16 NOTE — Telephone Encounter (Signed)
Louis House, [name]

## 2021-03-21 ENCOUNTER — Encounter (HOSPITAL_COMMUNITY): Payer: Self-pay | Admitting: Anesthesiology

## 2021-03-21 ENCOUNTER — Ambulatory Visit (HOSPITAL_COMMUNITY): Admission: RE | Admit: 2021-03-21 | Payer: Medicare Other | Source: Home / Self Care | Admitting: Gastroenterology

## 2021-03-31 ENCOUNTER — Ambulatory Visit (INDEPENDENT_AMBULATORY_CARE_PROVIDER_SITE_OTHER): Payer: Medicare HMO | Admitting: Gastroenterology

## 2021-04-06 NOTE — Patient Instructions (Signed)
Louis House  04/06/2021     @PREFPERIOPPHARMACY @   Your procedure is scheduled on  04/12/2021.   Report to [facility] at   0730  A.M.   Call this number if you have problems the morning of surgery:  [phone]   Remember:  Follow the diet and prep instructions given to you by the office.    Take these medicines the morning of surgery with A SIP OF WATER           uroxatrol, allopurinol, voltaren(if needed), cymbalta, gabapentin, hydrocodone (if needed), claritin, antivert (If needed), metoprolol, zanaflex (if needed), azalfidine.      Do not wear jewelry, make-up or nail polish.  Do not wear lotions, powders, or perfumes, or deodorant.  Do not shave 48 hours prior to surgery.  Men may shave face and neck.  Do not bring valuables to the hospital.  [facility] is not responsible for any belongings or valuables.  Contacts, dentures or bridgework may not be worn into surgery.  Leave your suitcase in the car.  After surgery it may be brought to your room.  For patients admitted to the hospital, discharge time will be determined by your treatment team.  Patients discharged the day of surgery will not be allowed to drive home and must have someone with them for 24 hours.     Special instructions:    DO NOT smoke tobacco or vape for 24 hours before your procedure.  Please read over the following fact sheets that you were given. Anesthesia Post-op Instructions and Care and Recovery After Surgery      Colonoscopy, Adult, Care After This sheet gives you information about how to care for yourself after your procedure. Your health care provider may also give you more specific instructions. If you have problems or questions, contact your health careprovider. What can I expect after the procedure? After the procedure, it is common to have: A small amount of blood in your stool for 24 hours after the procedure. Some gas. Mild cramping or bloating of your  abdomen. Follow these instructions at home: Eating and drinking  Drink enough fluid to keep your urine pale yellow. Follow instructions from your health care provider about eating or drinking restrictions. Resume your normal diet as instructed by your health care provider. Avoid heavy or fried foods that are hard to digest.  Activity Rest as told by your health care provider. Avoid sitting for a long time without moving. Get up to take short walks every 1-2 hours. This is important to improve blood flow and breathing. Ask for help if you feel weak or unsteady. Return to your normal activities as told by your health care provider. Ask your health care provider what activities are safe for you. Managing cramping and bloating  Try walking around when you have cramps or feel bloated. Apply heat to your abdomen as told by your health care provider. Use the heat source that your health care provider recommends, such as a moist heat pack or a heating pad. Place a towel between your skin and the heat source. Leave the heat on for 20-30 minutes. Remove the heat if your skin turns bright red. This is especially important if you are unable to feel pain, heat, or cold. You may have a greater risk of getting burned.  General instructions If you were given a sedative during the procedure, it can affect you for several hours. Do not drive or operate machinery  until your health care provider says that it is safe. For the first 24 hours after the procedure: Do not sign important documents. Do not drink alcohol. Do your regular daily activities at a slower pace than normal. Eat soft foods that are easy to digest. Take over-the-counter and prescription medicines only as told by your health care provider. Keep all follow-up visits as told by your health care provider. This is important. Contact a health care provider if: You have blood in your stool 2-3 days after the procedure. Get help right away if you  have: More than a small spotting of blood in your stool. Large blood clots in your stool. Swelling of your abdomen. Nausea or vomiting. A fever. Increasing pain in your abdomen that is not relieved with medicine. Summary After the procedure, it is common to have a small amount of blood in your stool. You may also have mild cramping and bloating of your abdomen. If you were given a sedative during the procedure, it can affect you for several hours. Do not drive or operate machinery until your health care provider says that it is safe. Get help right away if you have a lot of blood in your stool, nausea or vomiting, a fever, or increased pain in your abdomen. This information is not intended to replace advice given to you by your health care provider. Make sure you discuss any questions you have with your healthcare provider. Document Revised: 08/22/2019 Document Reviewed: 03/24/2019 Elsevier Patient Education  [place] After This sheet gives you information about how to care for yourself after your procedure. Your health care provider may also give you more specific instructions. If you have problems or questions, contact your health careprovider. What can I expect after the procedure? After the procedure, it is common to have: Tiredness. Forgetfulness about what happened after the procedure. Impaired judgment for important decisions. Nausea or vomiting. Some difficulty with balance. Follow these instructions at home: For the time period you were told by your health care provider:     Rest as needed. Do not participate in activities where you could fall or become injured. Do not drive or use machinery. Do not drink alcohol. Do not take sleeping pills or medicines that cause drowsiness. Do not make important decisions or sign legal documents. Do not take care of children on your own. Eating and drinking Follow the diet that is recommended  by your health care provider. Drink enough fluid to keep your urine pale yellow. If you vomit: Drink water, juice, or soup when you can drink without vomiting. Make sure you have little or no nausea before eating solid foods. General instructions Have a responsible adult stay with you for the time you are told. It is important to have someone help care for you until you are awake and alert. Take over-the-counter and prescription medicines only as told by your health care provider. If you have sleep apnea, surgery and certain medicines can increase your risk for breathing problems. Follow instructions from your health care provider about wearing your sleep device: Anytime you are sleeping, including during daytime naps. While taking prescription pain medicines, sleeping medicines, or medicines that make you drowsy. Avoid smoking. Keep all follow-up visits as told by your health care provider. This is important. Contact a health care provider if: You keep feeling nauseous or you keep vomiting. You feel light-headed. You are still sleepy or having trouble with balance after 24 hours. You develop a  rash. You have a fever. You have redness or swelling around the IV site. Get help right away if: You have trouble breathing. You have new-onset confusion at home. Summary For several hours after your procedure, you may feel tired. You may also be forgetful and have poor judgment. Have a responsible adult stay with you for the time you are told. It is important to have someone help care for you until you are awake and alert. Rest as told. Do not drive or operate machinery. Do not drink alcohol or take sleeping pills. Get help right away if you have trouble breathing, or if you suddenly become confused. This information is not intended to replace advice given to you by your health care provider. Make sure you discuss any questions you have with your healthcare provider. Document Revised: 05/13/2020  Document Reviewed: 07/31/2019 Elsevier Patient Education  2022 [employer].

## 2021-04-07 ENCOUNTER — Encounter (HOSPITAL_COMMUNITY)
Admission: RE | Admit: 2021-04-07 | Discharge: 2021-04-07 | Disposition: A | Discharge status: Home / Self Care | Payer: Medicare Other | Source: Ambulatory Visit | Attending: Gastroenterology | Admitting: Gastroenterology

## 2021-04-07 ENCOUNTER — Encounter (HOSPITAL_COMMUNITY): Payer: Self-pay

## 2021-04-12 ENCOUNTER — Other Ambulatory Visit: Payer: Self-pay

## 2021-04-12 ENCOUNTER — Encounter (HOSPITAL_COMMUNITY): Payer: Self-pay | Admitting: Gastroenterology

## 2021-04-12 ENCOUNTER — Ambulatory Visit (HOSPITAL_COMMUNITY): Payer: Medicare Other | Admitting: Anesthesiology

## 2021-04-12 ENCOUNTER — Encounter (HOSPITAL_COMMUNITY)
Admission: RE | Disposition: A | Discharge status: Home / Self Care | Payer: Self-pay | Source: Home / Self Care | Attending: Gastroenterology

## 2021-04-12 ENCOUNTER — Ambulatory Visit (HOSPITAL_COMMUNITY)
Admission: RE | Admit: 2021-04-12 | Discharge: 2021-04-12 | Disposition: A | Discharge status: Home / Self Care | Payer: Medicare Other | Attending: Gastroenterology | Admitting: Gastroenterology

## 2021-04-12 DIAGNOSIS — K529 Noninfective gastroenteritis and colitis, unspecified: Secondary | ICD-10-CM

## 2021-04-12 DIAGNOSIS — R238 Other skin changes: Secondary | ICD-10-CM | POA: Diagnosis not present

## 2021-04-12 DIAGNOSIS — E1140 Type 2 diabetes mellitus with diabetic neuropathy, unspecified: Secondary | ICD-10-CM | POA: Insufficient documentation

## 2021-04-12 DIAGNOSIS — E7800 Pure hypercholesterolemia, unspecified: Secondary | ICD-10-CM | POA: Insufficient documentation

## 2021-04-12 DIAGNOSIS — D490 Neoplasm of unspecified behavior of digestive system: Secondary | ICD-10-CM

## 2021-04-12 DIAGNOSIS — I10 Essential (primary) hypertension: Secondary | ICD-10-CM | POA: Diagnosis not present

## 2021-04-12 DIAGNOSIS — K523 Indeterminate colitis: Secondary | ICD-10-CM | POA: Diagnosis not present

## 2021-04-12 DIAGNOSIS — Z79899 Other long term (current) drug therapy: Secondary | ICD-10-CM | POA: Insufficient documentation

## 2021-04-12 DIAGNOSIS — K52831 Collagenous colitis: Secondary | ICD-10-CM | POA: Diagnosis not present

## 2021-04-12 DIAGNOSIS — G4730 Sleep apnea, unspecified: Secondary | ICD-10-CM | POA: Diagnosis not present

## 2021-04-12 DIAGNOSIS — K5730 Diverticulosis of large intestine without perforation or abscess without bleeding: Secondary | ICD-10-CM | POA: Diagnosis not present

## 2021-04-12 DIAGNOSIS — K5190 Ulcerative colitis, unspecified, without complications: Secondary | ICD-10-CM | POA: Diagnosis not present

## 2021-04-12 DIAGNOSIS — K6389 Other specified diseases of intestine: Secondary | ICD-10-CM | POA: Diagnosis not present

## 2021-04-12 HISTORY — PX: BIOPSY: SHX5522

## 2021-04-12 HISTORY — PX: COLONOSCOPY WITH PROPOFOL: SHX5780

## 2021-04-12 LAB — HEPATITIS B SURFACE ANTIGEN: Hepatitis B Surface Ag: NONREACTIVE

## 2021-04-12 SURGERY — COLONOSCOPY WITH PROPOFOL
Anesthesia: General

## 2021-04-12 MED ORDER — SULFASALAZINE 500 MG PO TABS: 1000.0000 mg | ORAL_TABLET | Freq: Two times a day (BID) | ORAL | 3 refills | Status: DC

## 2021-04-12 MED ORDER — PROPOFOL 10 MG/ML IV BOLUS
INTRAVENOUS | Status: DC | PRN
  Administered 2021-04-12: 30 mg via INTRAVENOUS
  Administered 2021-04-12: 50 mg via INTRAVENOUS

## 2021-04-12 MED ORDER — PROPOFOL 500 MG/50ML IV EMUL
INTRAVENOUS | Status: DC | PRN
  Administered 2021-04-12: 150 ug/kg/min via INTRAVENOUS

## 2021-04-12 MED ORDER — FOLIC ACID 1 MG PO TABS: 1.0000 mg | ORAL_TABLET | Freq: Every day | ORAL | 0 refills | Status: DC

## 2021-04-12 MED ORDER — LACTATED RINGERS IV SOLN
INTRAVENOUS | Status: DC
  Administered 2021-04-12: 1000 mL via INTRAVENOUS

## 2021-04-12 MED ORDER — PHENYLEPHRINE HCL (PRESSORS) 10 MG/ML IV SOLN
INTRAVENOUS | Status: DC | PRN
  Administered 2021-04-12: 120 ug via INTRAVENOUS
  Administered 2021-04-12: 80 ug via INTRAVENOUS
  Administered 2021-04-12: 120 ug via INTRAVENOUS
  Administered 2021-04-12: 80 ug via INTRAVENOUS

## 2021-04-12 NOTE — Op Note (Signed)
[facility] Patient Name: Louis House Procedure Date: 04/12/2021 8:59 AM MRN: [id number] Date of Birth: [birth year] Attending MD: [name] ,  CSN: [id number] Age: 65 Admit Type: Outpatient Procedure:                Colonoscopy Indications:              Chronic diarrhea, Follow-up of indeterminate colitis Providers:                [name], [place] Sharon Seller, RN, Gwynneth Albright RN, RN, Nelma Rothman, Technician, Aram Candela Referring MD:              Medicines:                Monitored Anesthesia Care Complications:            No immediate complications. Estimated Blood Loss:     Estimated blood loss: none. Procedure:                Pre-Anesthesia Assessment:                           - Prior to the procedure, a History and Physical                            was performed, and patient medications, allergies                            and sensitivities were reviewed. The patient's                            tolerance of previous anesthesia was reviewed.                           - The risks and benefits of the procedure and the                            sedation options and risks were discussed with the                            patient. All questions were answered and informed                            consent was obtained.                           - ASA Grade Assessment: II - A patient with mild                            systemic disease.                           After obtaining informed consent, the colonoscope  was passed under direct vision. Throughout the                            procedure, the patient's blood pressure, pulse, and                            oxygen saturations were monitored continuously. The                            PCF-HQ190L ([id number]) was introduced through the                            anus and advanced to the the cecum, identified by                             appendiceal orifice and ileocecal valve. The                            colonoscopy was technically difficult and complex                            due to significant looping in the distal ascending                            colon. Successful completion of the procedure was                            aided by applying abdominal pressure in the mid                            abdomen and stiffening the scope. The patient                            tolerated the procedure well. The quality of the                            bowel preparation was good. Scope In: 9:21:13 AM Scope Out: 10:59:05 AM Scope Withdrawal Time: 1 hour 25 minutes 22 seconds  Total Procedure Duration: 1 hour 37 minutes 52 seconds  Findings:      The perianal and digital rectal examinations were normal.      A localized area of mildly nodular mucosa was found in the proximal       ascending colon, just above the IC valve. Biopsies were taken with a       cold forceps for histology.      Multiple small and large-mouthed diverticula were found in the entire       colon. Biopsies for histology from the normal colon were taken with a       cold forceps from the right colon and left colon for evaluation of       microscopic colitis.      A fungating and polypoid non-obstructing mass with a central depression       was found at 8 cm proximal to the anus. The mass was       non-circumferential.  The mass measured two cm in length. In addition,       its diameter measured twenty mm. No bleeding was present. This was       biopsied with a cold forceps for histology.      The retroflexed view of the distal rectum and anal verge was normal and       showed no anal or rectal abnormalities. Impression:               - Nodular mucosa in the proximal ascending colon.                            Biopsied.                           - Diverticulosis in the entire examined colon.                            Biopsied.                            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 Biopsied.                           - The distal rectum and anal verge are normal on                            retroflexion view. Moderate Sedation:      Per Anesthesia Care Recommendation:           - Discharge patient to home (ambulatory).                           - Resume previous diet.                           - Await pathology results.                           - Repeat colonoscopy for surveillance based on                            pathology results.                           - Continue sulfazalazine at same dose.                           - Check quantiferon and hepatitis B serology today. Procedure Code(s):        --- Professional ---                           [phone], Colonoscopy, flexible; with biopsy, single                            or multiple  Diagnosis Code(s):        --- Professional ---                           K63.89, Other specified diseases of intestine                           D49.0, Neoplasm of unspecified behavior of                            digestive system                           K52.9, Noninfective gastroenteritis and colitis,                            unspecified                           K51.90, Ulcerative colitis, unspecified, without                            complications                           K57.30, Diverticulosis of large intestine without                            perforation or abscess without bleeding CPT copyright 2019 [employer]. All rights reserved. The codes documented in this report are preliminary and upon coder review may  be revised to meet current compliance requirements. [name], MD [name],  04/12/2021 10:12:03 AM This report has been signed electronically. Number of Addenda: 0

## 2021-04-12 NOTE — H&P (Addendum)
Louis House is an 65 y.o. male.   Chief Complaint: Diarrhea history of indeterminate colitis HPI: Louis House is a 65 y.o. male with past medical history of indeterminate colitis, diabetes, hypertension, gout, hyperlipidemia and anxiety, who presents for evaluation of recurrent diarrhea and indeterminate colitis.  The patient reports that he has presented persistent diarrhea since the last time he was seen in clinic.  He denies having any abdominal pain, nausea, vomiting, fever, chills melena or hematochezia but reported that his diarrhea has been persistent even though he has been compliant with the sulfasalazine.  Back in 2018 had new onset of diarrhea which was evaluated with a colonoscopy. Colonoscopy showed presence of congestion in the ascending colon.  Biopsies showed presence of colitis and focal ulceration with no alterations in the architectural conformation and no chronic changes.  Notably, stool testing was negative for any infection but fecal calprotectin was severely increased at 1100  Past Medical History:  Diagnosis Date   Anxiety    Diabetes ([place]) 01/12/2017   Diarrhea 02/21/2017   Essential hypertension, benign 01/12/2017   Gout    High cholesterol 01/12/2017   Neuropathy    Sleep apnea    Vertigo     Past Surgical History:  Procedure Laterality Date   BIOPSY  04/06/2017   Procedure: BIOPSY;  Surgeon: Rogene Houston, MD;  Location: [facility];  Service: Endoscopy;;  colon   bone spurs     CATARACT EXTRACTION W/PHACO Left 04/22/2018   Procedure: CATARACT EXTRACTION PHACO AND INTRAOCULAR LENS PLACEMENT LEFT EYE;  Surgeon: Tonny Branch, MD;  Location: [facility];  Service: Ophthalmology;  Laterality: Left;  CDE: 7.58   COLONOSCOPY WITH PROPOFOL N/A 04/06/2017   Procedure: COLONOSCOPY WITH PROPOFOL;  Surgeon: Rogene Houston, MD;  Location: [facility];  Service: Endoscopy;  Laterality: N/A;  1:45   kneee arthroscopy     both knees   Left shoulder surgery from  injury     Umblical hernia      History reviewed. No pertinent family history. Social History:  reports that he has never smoked. He has never used smokeless tobacco. He reports that he does not drink alcohol and does not use drugs.  Allergies: No Known Allergies  Facility-Administered Medications Prior to Admission  Medication Dose Route Frequency Provider Last Rate Last Admin   dicyclomine (BENTYL) tablet 20 mg  20 mg Oral TID AC & HS Setzer, Terri L, NP       Medications Prior to Admission  Medication Sig Dispense Refill   alfuzosin (UROXATRAL) 10 MG 24 hr tablet Take 10 mg by mouth daily.     allopurinol (ZYLOPRIM) 300 MG tablet Take 300 mg by mouth daily.     atorvastatin (LIPITOR) 40 MG tablet Take 40 mg by mouth daily.     benazepril (LOTENSIN) 40 MG tablet Take 40 mg by mouth daily.  0   chlorthalidone (HYGROTON) 50 MG tablet Take 50 mg by mouth daily.     diazepam (VALIUM) 10 MG tablet Take 10 mg by mouth at bedtime.      diclofenac (VOLTAREN) 75 MG EC tablet Take 75 mg by mouth 2 (two) times daily.     DULoxetine (CYMBALTA) 20 MG capsule Take 20 mg by mouth daily.     fluticasone (FLONASE) 50 MCG/ACT nasal spray Place 1 spray into both nostrils daily as needed for allergies.     gabapentin (NEURONTIN) 400 MG capsule Take 800 mg by mouth See admin instructions.  Take 800 mg at bedtime, may take an additional 400-800 mg as needed for pain     HYDROcodone-acetaminophen (NORCO) 10-325 MG tablet Take 1 tablet by mouth 3 (three) times daily.     loratadine (CLARITIN) 10 MG tablet Take 10 mg by mouth daily as needed for allergies.     meclizine (ANTIVERT) 25 MG tablet Take 25 mg by mouth 3 (three) times daily as needed for dizziness (for flare up of inner ear issues.).      Melatonin 10 MG CAPS Take 40 mg by mouth at bedtime.     metoprolol succinate (TOPROL-XL) 100 MG 24 hr tablet Take 100 mg by mouth daily. Take with or immediately following a meal.     Naphazoline-Pheniramine  (OPCON-A) 0.027-0.315 % SOLN Place 1 drop into both eyes daily as needed (irritation).     Omega-3 Fatty Acids (OMEGA-3 FISH OIL PO) Take 4,800 mg by mouth 3 (three) times a week. 2400 mg per cap     oxymetazoline (AFRIN) 0.05 % nasal spray Place 3 sprays into both nostrils 2 (two) times daily.     polyethylene glycol-electrolytes (TRILYTE) 420 g solution Take 4,000 mLs by mouth as directed. 4000 mL 0   tizanidine (ZANAFLEX) 2 MG capsule Take 2 mg by mouth 3 (three) times daily as needed for muscle spasms.     traZODone (DESYREL) 50 MG tablet Take 50 mg by mouth at bedtime.     folic acid (FOLVITE) 1 MG tablet Take 1 tablet (1 mg total) by mouth daily. (Patient not taking: Reported on 04/11/2021) 90 tablet 0   polyethylene glycol-electrolytes (TRILYTE) 420 g solution Take 4,000 mLs by mouth as directed. (Patient not taking: Reported on 04/11/2021) 4000 mL 0   sulfaSALAzine (AZULFIDINE) 500 MG tablet Take 2 tablets (1,000 mg total) by mouth 2 (two) times daily. (Patient not taking: Reported on 04/11/2021) 360 tablet 3    No results found for this or any previous visit (from the past 48 hour(s)). No results found.  Review of Systems  Constitutional: Negative.   HENT: Negative.    Eyes: Negative.   Respiratory: Negative.    Cardiovascular: Negative.   Gastrointestinal:  Positive for diarrhea.  Endocrine: Negative.   Genitourinary: Negative.   Musculoskeletal: Negative.   Skin: Negative.   Allergic/Immunologic: Negative.   Neurological: Negative.   Hematological: Negative.   Psychiatric/Behavioral: Negative.     Blood pressure 110/66, temperature 98 F (36.7 C), temperature source Oral, resp. rate 20, height 6' 1"  (1.854 m), weight 124.7 kg, SpO2 97 %. Physical Exam  GENERAL: The patient is AO x3, in no acute distress. Obese. HEENT: Head is normocephalic and atraumatic. EOMI are intact. Mouth is well hydrated and without lesions. NECK: Supple. No masses LUNGS: Clear to auscultation. No  presence of rhonchi/wheezing/rales. Adequate chest expansion HEART: RRR, normal s1 and s2. ABDOMEN: Soft, nontender, no guarding, no peritoneal signs, and nondistended. BS +. No masses. EXTREMITIES: Without any cyanosis, clubbing, rash, lesions or edema. NEUROLOGIC: AOx3, no focal motor deficit. SKIN: no jaundice, no rashes  Assessment/Plan Louis House is a 65 y.o. male with past medical history of indeterminate colitis, diabetes, hypertension, gout, hyperlipidemia and anxiety, who presents for evaluation of recurrent diarrhea and indeterminate colitis.  We will proceed with colonoscopy.  Harvel Quale, MD 04/12/2021, 8:31 AM

## 2021-04-12 NOTE — Anesthesia Preprocedure Evaluation (Addendum)
Anesthesia Evaluation  Patient identified by MRN, date of birth, ID band Patient awake    Reviewed: Allergy & Precautions, NPO status , Patient's Chart, lab work & pertinent test results, reviewed documented beta blocker date and time   History of Anesthesia Complications Negative for: history of anesthetic complications  Airway Mallampati: III  TM Distance: >3 FB Neck ROM: Full    Dental  (+) Dental Advisory Given, Missing, Poor Dentition, Chipped   Pulmonary sleep apnea and Continuous Positive Airway Pressure Ventilation ,    Pulmonary exam normal breath sounds clear to auscultation       Cardiovascular hypertension, Pt. on medications and Pt. on home beta blockers Normal cardiovascular exam Rhythm:Regular Rate:Normal     Neuro/Psych Anxiety  Neuromuscular disease (neuropathy)    GI/Hepatic negative GI ROS, Neg liver ROS,   Endo/Other  diabetes, Well Controlled, Type 2, Oral Hypoglycemic Agents  Renal/GU negative Renal ROS     Musculoskeletal negative musculoskeletal ROS (+)   Abdominal   Peds  Hematology negative hematology ROS (+)   Anesthesia Other Findings Vertigo   Reproductive/Obstetrics negative OB ROS                            Anesthesia Physical Anesthesia Plan  ASA: 2  Anesthesia Plan: General   Post-op Pain Management:    Induction: Intravenous  PONV Risk Score and Plan: Propofol infusion  Airway Management Planned: Nasal Cannula and Natural Airway  Additional Equipment:   Intra-op Plan:   Post-operative Plan:   Informed Consent: I have reviewed the patients History and Physical, chart, labs and discussed the procedure including the risks, benefits and alternatives for the proposed anesthesia with the patient or authorized representative who has indicated his/her understanding and acceptance.     Dental advisory given  Plan Discussed with: CRNA and  Surgeon  Anesthesia Plan Comments:         Anesthesia Quick Evaluation

## 2021-04-12 NOTE — Anesthesia Postprocedure Evaluation (Signed)
Anesthesia Post Note  Patient: Louis House  Procedure(s) Performed: COLONOSCOPY WITH PROPOFOL BIOPSY  Patient location during evaluation: Phase II Anesthesia Type: General Level of consciousness: awake and alert and oriented Pain management: pain level controlled Vital Signs Assessment: post-procedure vital signs reviewed and stable Respiratory status: spontaneous breathing and respiratory function stable Cardiovascular status: blood pressure returned to baseline and stable Postop Assessment: no apparent nausea or vomiting Anesthetic complications: no   No notable events documented.   Last Vitals:  Vitals:   04/12/21 0828 04/12/21 1008  BP: 110/66 (!) 99/56  Pulse:  70  Resp: 20 18  Temp: 36.7 C 36.7 C  SpO2: 97% 96%    Last Pain:  Vitals:   04/12/21 1008  TempSrc: Oral  PainSc: 0-No pain                 Daneshia Tavano C Andra Matsuo

## 2021-04-12 NOTE — H&P (View-Only) (Signed)
Louis House is an 65 y.o. male.   Chief Complaint: Diarrhea history of indeterminate colitis HPI: Louis House is a 65 y.o. male with past medical history of indeterminate colitis, diabetes, hypertension, gout, hyperlipidemia and anxiety, who presents for evaluation of recurrent diarrhea and indeterminate colitis.  The patient reports that he has presented persistent diarrhea since the last time he was seen in clinic.  He denies having any abdominal pain, nausea, vomiting, fever, chills melena or hematochezia but reported that his diarrhea has been persistent even though he has been compliant with the sulfasalazine.  Back in 2018 had new onset of diarrhea which was evaluated with a colonoscopy. Colonoscopy showed presence of congestion in the ascending colon.  Biopsies showed presence of colitis and focal ulceration with no alterations in the architectural conformation and no chronic changes.  Notably, stool testing was negative for any infection but fecal calprotectin was severely increased at 1100  Past Medical History:  Diagnosis Date   Anxiety    Diabetes ([place]) 01/12/2017   Diarrhea 02/21/2017   Essential hypertension, benign 01/12/2017   Gout    High cholesterol 01/12/2017   Neuropathy    Sleep apnea    Vertigo     Past Surgical History:  Procedure Laterality Date   BIOPSY  04/06/2017   Procedure: BIOPSY;  Surgeon: Rogene Houston, MD;  Location: [facility];  Service: Endoscopy;;  colon   bone spurs     CATARACT EXTRACTION W/PHACO Left 04/22/2018   Procedure: CATARACT EXTRACTION PHACO AND INTRAOCULAR LENS PLACEMENT LEFT EYE;  Surgeon: Tonny Branch, MD;  Location: [facility];  Service: Ophthalmology;  Laterality: Left;  CDE: 7.58   COLONOSCOPY WITH PROPOFOL N/A 04/06/2017   Procedure: COLONOSCOPY WITH PROPOFOL;  Surgeon: Rogene Houston, MD;  Location: [facility];  Service: Endoscopy;  Laterality: N/A;  1:45   kneee arthroscopy     both knees   Left shoulder surgery from  injury     Umblical hernia      History reviewed. No pertinent family history. Social History:  reports that he has never smoked. He has never used smokeless tobacco. He reports that he does not drink alcohol and does not use drugs.  Allergies: No Known Allergies  Facility-Administered Medications Prior to Admission  Medication Dose Route Frequency Provider Last Rate Last Admin   dicyclomine (BENTYL) tablet 20 mg  20 mg Oral TID AC & HS Setzer, Terri L, NP       Medications Prior to Admission  Medication Sig Dispense Refill   alfuzosin (UROXATRAL) 10 MG 24 hr tablet Take 10 mg by mouth daily.     allopurinol (ZYLOPRIM) 300 MG tablet Take 300 mg by mouth daily.     atorvastatin (LIPITOR) 40 MG tablet Take 40 mg by mouth daily.     benazepril (LOTENSIN) 40 MG tablet Take 40 mg by mouth daily.  0   chlorthalidone (HYGROTON) 50 MG tablet Take 50 mg by mouth daily.     diazepam (VALIUM) 10 MG tablet Take 10 mg by mouth at bedtime.      diclofenac (VOLTAREN) 75 MG EC tablet Take 75 mg by mouth 2 (two) times daily.     DULoxetine (CYMBALTA) 20 MG capsule Take 20 mg by mouth daily.     fluticasone (FLONASE) 50 MCG/ACT nasal spray Place 1 spray into both nostrils daily as needed for allergies.     gabapentin (NEURONTIN) 400 MG capsule Take 800 mg by mouth See admin instructions.  Take 800 mg at bedtime, may take an additional 400-800 mg as needed for pain     HYDROcodone-acetaminophen (NORCO) 10-325 MG tablet Take 1 tablet by mouth 3 (three) times daily.     loratadine (CLARITIN) 10 MG tablet Take 10 mg by mouth daily as needed for allergies.     meclizine (ANTIVERT) 25 MG tablet Take 25 mg by mouth 3 (three) times daily as needed for dizziness (for flare up of inner ear issues.).      Melatonin 10 MG CAPS Take 40 mg by mouth at bedtime.     metoprolol succinate (TOPROL-XL) 100 MG 24 hr tablet Take 100 mg by mouth daily. Take with or immediately following a meal.     Naphazoline-Pheniramine  (OPCON-A) 0.027-0.315 % SOLN Place 1 drop into both eyes daily as needed (irritation).     Omega-3 Fatty Acids (OMEGA-3 FISH OIL PO) Take 4,800 mg by mouth 3 (three) times a week. 2400 mg per cap     oxymetazoline (AFRIN) 0.05 % nasal spray Place 3 sprays into both nostrils 2 (two) times daily.     polyethylene glycol-electrolytes (TRILYTE) 420 g solution Take 4,000 mLs by mouth as directed. 4000 mL 0   tizanidine (ZANAFLEX) 2 MG capsule Take 2 mg by mouth 3 (three) times daily as needed for muscle spasms.     traZODone (DESYREL) 50 MG tablet Take 50 mg by mouth at bedtime.     folic acid (FOLVITE) 1 MG tablet Take 1 tablet (1 mg total) by mouth daily. (Patient not taking: Reported on 04/11/2021) 90 tablet 0   polyethylene glycol-electrolytes (TRILYTE) 420 g solution Take 4,000 mLs by mouth as directed. (Patient not taking: Reported on 04/11/2021) 4000 mL 0   sulfaSALAzine (AZULFIDINE) 500 MG tablet Take 2 tablets (1,000 mg total) by mouth 2 (two) times daily. (Patient not taking: Reported on 04/11/2021) 360 tablet 3    No results found for this or any previous visit (from the past 48 hour(s)). No results found.  Review of Systems  Constitutional: Negative.   HENT: Negative.    Eyes: Negative.   Respiratory: Negative.    Cardiovascular: Negative.   Gastrointestinal:  Positive for diarrhea.  Endocrine: Negative.   Genitourinary: Negative.   Musculoskeletal: Negative.   Skin: Negative.   Allergic/Immunologic: Negative.   Neurological: Negative.   Hematological: Negative.   Psychiatric/Behavioral: Negative.     Blood pressure 110/66, temperature 98 F (36.7 C), temperature source Oral, resp. rate 20, height 6' 1"  (1.854 m), weight 124.7 kg, SpO2 97 %. Physical Exam  GENERAL: The patient is AO x3, in no acute distress. Obese. HEENT: Head is normocephalic and atraumatic. EOMI are intact. Mouth is well hydrated and without lesions. NECK: Supple. No masses LUNGS: Clear to auscultation. No  presence of rhonchi/wheezing/rales. Adequate chest expansion HEART: RRR, normal s1 and s2. ABDOMEN: Soft, nontender, no guarding, no peritoneal signs, and nondistended. BS +. No masses. EXTREMITIES: Without any cyanosis, clubbing, rash, lesions or edema. NEUROLOGIC: AOx3, no focal motor deficit. SKIN: no jaundice, no rashes  Assessment/Plan Louis House is a 65 y.o. male with past medical history of indeterminate colitis, diabetes, hypertension, gout, hyperlipidemia and anxiety, who presents for evaluation of recurrent diarrhea and indeterminate colitis.  We will proceed with colonoscopy.  Harvel Quale, MD 04/12/2021, 8:31 AM

## 2021-04-12 NOTE — Discharge Instructions (Signed)
You are being discharged to home.  Resume your previous diet.  We are waiting for your pathology results.  Your physician has recommended a repeat colonoscopy for surveillance based on pathology results.  Continue sulfazalazine at same dose.

## 2021-04-12 NOTE — Transfer of Care (Signed)
Immediate Anesthesia Transfer of Care Note  Patient: Louis House  Procedure(s) Performed: COLONOSCOPY WITH PROPOFOL BIOPSY  Patient Location: Short Stay  Anesthesia Type:General  Level of Consciousness: awake, alert  and oriented  Airway & Oxygen Therapy: Patient Spontanous Breathing  Post-op Assessment: Report given to RN and Post -op Vital signs reviewed and stable  Post vital signs: Reviewed and stable  Last Vitals:  Vitals Value Taken Time  BP    Temp    Pulse 75 04/12/21  1006  Resp    SpO2 97 04/12/21  1006    Last Pain:  Vitals:   04/12/21 0918  TempSrc:   PainSc: 0-No pain      Patients Stated Pain Goal: 6 (60/47/99 8721)  Complications: No notable events documented.

## 2021-04-13 LAB — CEA: CEA: 1.1 ng/mL (ref 0.0–4.7)

## 2021-04-13 IMAGING — XA Imaging study
2 series · 2 of 2 positions shown · non-contrast
Comparison: none

CLINICAL DATA: Lumbosacral spondylosis without myelopathy. Low back
and right leg pain. L4-L5 spondylosis.

[Series 1: ortho standard · 1 of 1 slices shown (1 of 2)]
[im 1/1]
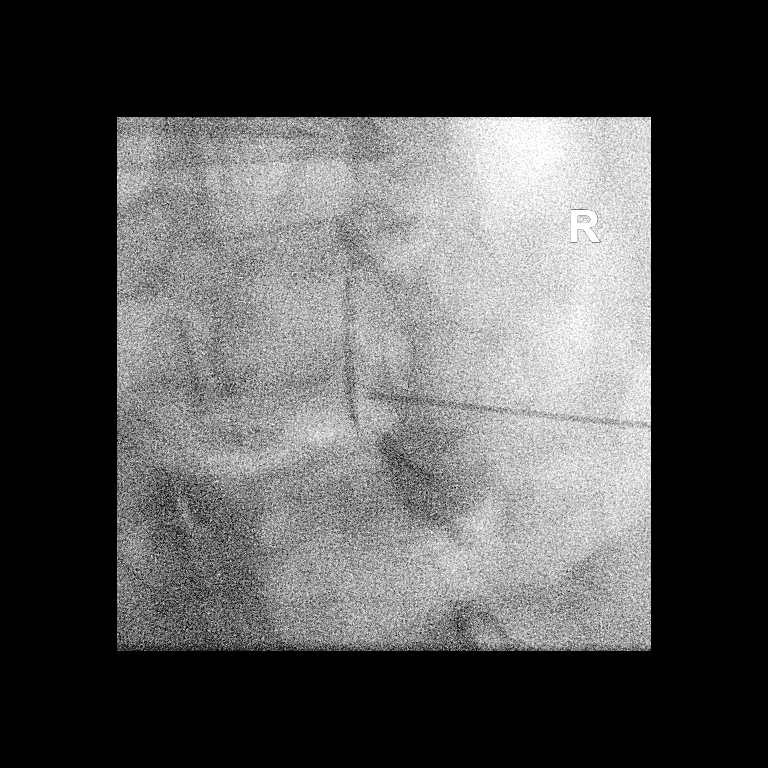

[Series 2: ortho standard · 1 of 1 slices shown (2 of 2)]
[im 1/1]
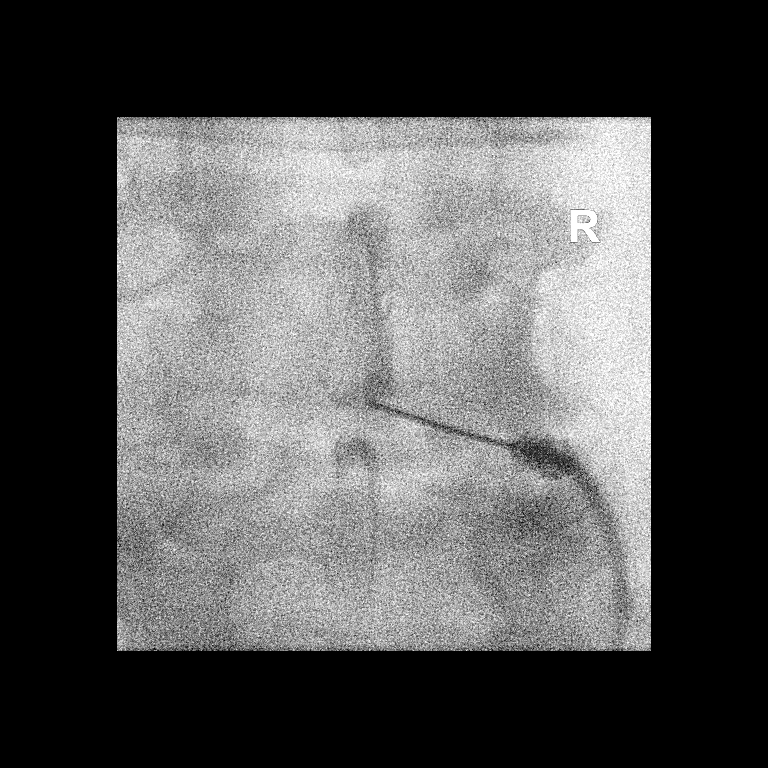

[2 of 2 positions shown; findings below may reference images not displayed]

FLUOROSCOPY TIME:  Radiation Exposure Index (as provided by the
fluoroscopic device): 3.6 mGy

Fluoroscopy Time:  24 seconds

Number of Acquired Images:  0

PROCEDURE:
The procedure, risks, benefits, and alternatives were explained to
the patient. Questions regarding the procedure were encouraged and
answered. The patient understands and consents to the procedure.

LUMBAR EPIDURAL INJECTION:

An interlaminar approach was performed on the right at L4-L5. The
overlying skin was cleansed and anesthetized. A 3.5 inch 20 gauge
epidural needle was advanced using loss-of-resistance technique.

DIAGNOSTIC EPIDURAL INJECTION:

Injection of Isovue-M 200 shows a good epidural pattern with spread
above and below the level of needle placement, primarily on the
right. No vascular opacification is seen.

THERAPEUTIC EPIDURAL INJECTION:

120 mg of Depo-Medrol mixed with 3 mL of 1% lidocaine were
instilled. The procedure was well-tolerated, and the patient was
discharged thirty minutes following the injection in good condition.

COMPLICATIONS:
None immediate.
IMPRESSION: Technically successful interlaminar epidural injection on the right
at L4-L5.

## 2021-04-14 ENCOUNTER — Other Ambulatory Visit (INDEPENDENT_AMBULATORY_CARE_PROVIDER_SITE_OTHER): Payer: Self-pay | Admitting: Gastroenterology

## 2021-04-14 DIAGNOSIS — C19 Malignant neoplasm of rectosigmoid junction: Secondary | ICD-10-CM

## 2021-04-14 DIAGNOSIS — K52831 Collagenous colitis: Secondary | ICD-10-CM

## 2021-04-14 MED ORDER — COLESTIPOL HCL 1 G PO TABS: 2.0000 g | ORAL_TABLET | Freq: Every day | ORAL | 1 refills | Status: DC

## 2021-04-15 ENCOUNTER — Other Ambulatory Visit (INDEPENDENT_AMBULATORY_CARE_PROVIDER_SITE_OTHER): Payer: Self-pay

## 2021-04-15 ENCOUNTER — Encounter (INDEPENDENT_AMBULATORY_CARE_PROVIDER_SITE_OTHER): Payer: Self-pay

## 2021-04-15 DIAGNOSIS — Z01812 Encounter for preprocedural laboratory examination: Secondary | ICD-10-CM

## 2021-04-15 LAB — SURGICAL PATHOLOGY

## 2021-04-16 LAB — QUANTIFERON-TB GOLD PLUS: QuantiFERON-TB Gold Plus: NEGATIVE

## 2021-04-16 LAB — QUANTIFERON-TB GOLD PLUS (RQFGPL)
QuantiFERON Mitogen Value: >10 IU/mL
QuantiFERON Nil Value: 0 IU/mL
QuantiFERON TB1 Ag Value: 0 IU/mL
QuantiFERON TB2 Ag Value: 0 IU/mL

## 2021-04-18 ENCOUNTER — Ambulatory Visit (HOSPITAL_COMMUNITY): Payer: Medicare Other | Admitting: Hematology

## 2021-04-21 ENCOUNTER — Encounter (HOSPITAL_COMMUNITY): Payer: Self-pay | Admitting: Gastroenterology

## 2021-04-30 NOTE — Progress Notes (Signed)
[facility] [address] [place] [address]   CLINIC:  Medical Oncology/Hematology  CONSULT NOTE  Patient Care Team: Neale Burly, MD as PCP - General (Internal Medicine) Derek Jack, MD as Medical Oncologist (Oncology) Brien Mates, RN as Oncology Nurse Navigator (Oncology)  CHIEF COMPLAINTS/PURPOSE OF CONSULTATION:  Evaluation of rectal cancer  HISTORY OF PRESENTING ILLNESS:  Louis House 65 y.o. male is here because of evaluation of rectal cancer, at the request of Dr. Jenetta Downer.  Today he reports feeling well. He had a colonoscopy on 04/12/21 due to diarrhea beginning in May. The diarrhea is still present, and he denies any blood in his stool. He is taking Colestid for the diarrhea. He takes hydrocodone prn for chronic back pain. He reports slight fatigue, and he is currently taking vitamin B-12. He denies any history of cardiac issues or CVA. He reports a chronic inner ear issue that has been present since his 66s and causes occasional dizziness.  He lives at home by himself in [place]. He had a sister who has lung cancer who had a history of smoking, but denies any further family history of cancer. Prior to retirement he worked at the [employer], and he reports possible exposure to [employer] while working. He denies any history of smoking.   MEDICAL HISTORY:  Past Medical History:  Diagnosis Date   Anxiety    Diabetes ([place]) 01/12/2017   Diarrhea 02/21/2017   Essential hypertension, benign 01/12/2017   Gout    High cholesterol 01/12/2017   Neuropathy    Sleep apnea    Vertigo     SURGICAL HISTORY: Past Surgical History:  Procedure Laterality Date   BIOPSY  04/06/2017   Procedure: BIOPSY;  Surgeon: Rogene Houston, MD;  [facility]: [facility];  Service: Endoscopy;;  colon   BIOPSY  04/12/2021   Procedure: BIOPSY;  Surgeon: Montez Morita, Quillian Quince, MD;  Location: [facility];  Service: Gastroenterology;;   nodular area in ascending colon biopsy random colon bx   bone spurs     CATARACT EXTRACTION W/PHACO Left 04/22/2018   Procedure: CATARACT EXTRACTION PHACO AND INTRAOCULAR LENS PLACEMENT LEFT EYE;  Surgeon: Tonny Branch, MD;  Location: [facility];  Service: Ophthalmology;  Laterality: Left;  CDE: 7.58   COLONOSCOPY WITH PROPOFOL N/A 04/06/2017   Procedure: COLONOSCOPY WITH PROPOFOL;  Surgeon: Rogene Houston, MD;  [facility];  Service: Endoscopy;  Laterality: N/A;  1:45   COLONOSCOPY WITH PROPOFOL N/A 04/12/2021   Procedure: COLONOSCOPY WITH PROPOFOL;  Surgeon: Harvel Quale, MD;  [facility];  Service: Gastroenterology;  Laterality: N/A;  9:05   kneee arthroscopy     both knees   Left shoulder surgery from injury     Umblical hernia      SOCIAL HISTORY: Social History   Socioeconomic History   Marital status: Divorced    Spouse name: Not on file   Number of children: Not on file   Years of education: Not on file   Highest education level: Not on file  Occupational History   Not on file  Tobacco Use   Smoking status: Never   Smokeless tobacco: Never  Vaping Use   Vaping Use: Never used  Substance and Sexual Activity   Alcohol use: No   Drug use: No   Sexual activity: Yes    Birth control/protection: None  Other Topics Concern   Not on file  Social History  Narrative   Not on file   Social Determinants of Health   Financial Resource Strain: Low Risk    Difficulty of Paying Living Expenses: Not very hard  Food Insecurity: No Food Insecurity   Worried About Running Out of Food in the Last Year: Never true   Ran Out of Food in the Last Year: Never true  Transportation Needs: No Transportation Needs   Lack of Transportation (Medical): No   Lack of Transportation (Non-Medical): No  Physical Activity: Insufficiently Active   Days of Exercise per Week: 2 days   Minutes of Exercise per Session: 20 min  Stress: No Stress Concern Present    Feeling of Stress : Not at all  Social Connections: Socially Isolated   Frequency of Communication with Friends and Family: More than three times a week   Frequency of Social Gatherings with Friends and Family: More than three times a week   Attends Religious Services: Never   Marine scientist or Organizations: No   Attends Music therapist: Never   Marital Status: Divorced  Human resources officer Violence: Not At Risk   Fear of Current or Ex-Partner: No   Emotionally Abused: No   Physically Abused: No   Sexually Abused: No    FAMILY HISTORY: No family history on file.  ALLERGIES:  has No Known Allergies.  MEDICATIONS:  Current Outpatient Medications  Medication Sig Dispense Refill   alfuzosin (UROXATRAL) 10 MG 24 hr tablet Take 10 mg by mouth daily.     allopurinol (ZYLOPRIM) 300 MG tablet Take 300 mg by mouth daily.     atorvastatin (LIPITOR) 40 MG tablet Take 40 mg by mouth daily.     benazepril (LOTENSIN) 40 MG tablet Take 40 mg by mouth daily.  0   chlorthalidone (HYGROTON) 50 MG tablet Take 50 mg by mouth daily.     colestipol (COLESTID) 1 g tablet Take 2 tablets (2 g total) by mouth daily. Take 4 hours apart form your other medications 180 tablet 1   diazepam (VALIUM) 10 MG tablet Take 10 mg by mouth at bedtime.      diclofenac (VOLTAREN) 75 MG EC tablet Take 75 mg by mouth 2 (two) times daily.     DULoxetine (CYMBALTA) 20 MG capsule Take 20 mg by mouth daily.     gabapentin (NEURONTIN) 400 MG capsule Take 800 mg by mouth See admin instructions. Take 800 mg at bedtime, may take an additional 400-800 mg as needed for pain     HYDROcodone-acetaminophen (NORCO) 10-325 MG tablet Take 1 tablet by mouth 3 (three) times daily.     loratadine (CLARITIN) 10 MG tablet Take 10 mg by mouth daily as needed for allergies.     meclizine (ANTIVERT) 25 MG tablet Take 25 mg by mouth 3 (three) times daily as needed for dizziness (for flare up of inner ear issues.).       Melatonin 10 MG CAPS Take 40 mg by mouth at bedtime.     metoprolol succinate (TOPROL-XL) 100 MG 24 hr tablet Take 100 mg by mouth daily. Take with or immediately following a meal.     Omega-3 Fatty Acids (OMEGA-3 FISH OIL PO) Take 4,800 mg by mouth 3 (three) times a week. 2400 mg per cap     oxymetazoline (AFRIN) 0.05 % nasal spray Place 3 sprays into both nostrils 2 (two) times daily.     traZODone (DESYREL) 50 MG tablet Take 50 mg by mouth at bedtime.  DULoxetine (CYMBALTA) 30 MG capsule duloxetine 30 mg capsule,delayed release  TAKE ONE CAPSULE BY MOUTH EVERY DAY AS NEEDED (Patient not taking: Reported on 05/02/2021)     fluticasone (FLONASE) 50 MCG/ACT nasal spray Place 1 spray into both nostrils daily as needed for allergies. (Patient not taking: Reported on 05/02/2021)     Naphazoline-Pheniramine (OPCON-A) 0.027-0.315 % SOLN Place 1 drop into both eyes daily as needed (irritation). (Patient not taking: Reported on 05/02/2021)     tizanidine (ZANAFLEX) 2 MG capsule Take 2 mg by mouth 3 (three) times daily as needed for muscle spasms. (Patient not taking: Reported on 05/02/2021)     Current Facility-Administered Medications  Medication Dose Route Frequency Provider Last Rate Last Admin   dicyclomine (BENTYL) tablet 20 mg  20 mg Oral TID AC & HS Setzer, Terri L, NP        REVIEW OF SYSTEMS:   Review of Systems  Constitutional:  Positive for fatigue (50%). Negative for appetite change.  Gastrointestinal:  Positive for diarrhea. Negative for blood in stool.  Musculoskeletal:  Positive for back pain (chronic).  Neurological:  Positive for dizziness.  Psychiatric/Behavioral:  Positive for sleep disturbance (falling and staying asleep).   All other systems reviewed and are negative.   PHYSICAL EXAMINATION: ECOG PERFORMANCE STATUS: 0 - Asymptomatic  Vitals:   05/02/21 1325 05/02/21 1334  BP: 126/76 126/76  Pulse: 81 81  Resp: 18 18  Temp: 98.2 F (36.8 C) 98.2 F (36.8 C)  SpO2:   98%   Filed Weights   05/02/21 1325 05/02/21 1334  Weight: 279 lb (126.6 kg) 279 lb (126.6 kg)   Physical Exam Vitals reviewed.  Constitutional:      Appearance: Normal appearance. He is obese.  Cardiovascular:     Rate and Rhythm: Normal rate and regular rhythm.     Pulses: Normal pulses.     Heart sounds: Normal heart sounds.  Pulmonary:     Effort: Pulmonary effort is normal.     Breath sounds: Normal breath sounds.  Abdominal:     Palpations: Abdomen is soft. There is no hepatomegaly, splenomegaly or mass.     Tenderness: There is no abdominal tenderness.  Neurological:     General: No focal deficit present.     Mental Status: He is alert and oriented to person, place, and time.  Psychiatric:        Mood and Affect: Mood normal.        Behavior: Behavior normal.     LABORATORY DATA:  I have reviewed the data as listed CBC Latest Ref Rng & Units 02/10/2021 06/19/2019 06/17/2018  WBC 3.8 - 10.8 Thousand/uL 6.6 9.2 8.9  Hemoglobin 13.2 - 17.1 g/dL 11.0(L) 13.6 12.4(L)  Hematocrit 38.5 - 50.0 % 33.2(L) 40.2 37.1(L)  Platelets 140 - 400 Thousand/uL 274 357 324   CMP Latest Ref Rng & Units 02/10/2021 06/19/2019 04/16/2018  Glucose 65 - 139 mg/dL 93 96 96  BUN 7 - 25 mg/dL 15 39(H) 21  Creatinine 0.70 - 1.25 mg/dL 1.21 1.27(H) 1.37(H)  Sodium 135 - 146 mmol/L 134(L) 136 130(L)  Potassium 3.5 - 5.3 mmol/L 4.5 4.4 3.9  Chloride 98 - 110 mmol/L 94(L) 96(L) 90(L)  CO2 20 - 32 mmol/L 34(H) 34(H) 30  Calcium 8.6 - 10.3 mg/dL 10.0 10.2 9.3  Total Protein 6.1 - 8.1 g/dL 6.2 6.8 -  Total Bilirubin 0.2 - 1.2 mg/dL 0.4 0.4 -  AST 10 - 35 U/L 28 18 -  ALT 9 -  46 U/L 37 24 -    RADIOGRAPHIC STUDIES: I have personally reviewed the radiological images as listed and agreed with the findings in the report. No results found.  ASSESSMENT:  Rectal cancer: - He reported diarrhea which started in May.  Prior colonoscopy was in 2018. - Colonoscopy on 04/12/2021 showed fungating polypoid  nonobstructing mass with central depression found at 8 cm from the anal verge.  Mass was noncircumferential, measured 2 cm in length.  Nodular mucosa in the proximal ascending colon which was biopsied. - Pathology of the rectal mass consistent with adenocarcinoma.  Nodular area in the ascending colon was consistent with collagenous colitis.  2.  Social/family history: - He lives at home by himself.  Daughter lives in [place]. - He worked at [employer] in [place] prior to retirement.  Non-smoker. - Sister had lung cancer and was a smoker.  No family history of colon cancer.   PLAN:  Rectal cancer: -We reviewed colonoscopy report and biopsy results in detail. - He will proceed with CT CAP on 05/13/2021. - We will schedule him for MRI of the pelvis rectal cancer protocol for T and N staging.  We will discuss further treatment plan after the scans. - We will also request MSI testing on the pathology.  We will also order baseline CEA level. - He has an appointment to see Dr. Marcello Moores next week. - We will also make a referral to radiation oncology.  We will see him back after the scans.  2.  Normocytic anemia: - His last hemoglobin is between 11 and 12. - We will check ferritin, iron panel, I78, folic acid levels.  3.  Diarrhea: - Continue colestipol 2 g daily as prescribed by Dr. Jenetta Downer.  4.  Chronic back pain: - He takes hydrocodone 10/325 3 times as needed as needed.  All questions were answered. The patient knows to call the clinic with any problems, questions or concerns.   Derek Jack, MD, 05/02/21 2:01 PM  [facility] [phone]   I, Thana Ates, am acting as a scribe for Dr. Derek Jack.  I, Derek Jack MD, have reviewed the above documentation for accuracy and completeness, and I agree with the above.

## 2021-05-01 DIAGNOSIS — C20 Malignant neoplasm of rectum: Secondary | ICD-10-CM | POA: Insufficient documentation

## 2021-05-02 ENCOUNTER — Inpatient Hospital Stay (HOSPITAL_COMMUNITY): Payer: Medicare Other

## 2021-05-02 ENCOUNTER — Encounter (HOSPITAL_COMMUNITY): Payer: Self-pay | Admitting: Hematology

## 2021-05-02 ENCOUNTER — Inpatient Hospital Stay (HOSPITAL_COMMUNITY): Payer: Medicare Other | Attending: Hematology | Admitting: Hematology

## 2021-05-02 ENCOUNTER — Other Ambulatory Visit: Payer: Self-pay

## 2021-05-02 DIAGNOSIS — D649 Anemia, unspecified: Secondary | ICD-10-CM | POA: Diagnosis not present

## 2021-05-02 DIAGNOSIS — C20 Malignant neoplasm of rectum: Secondary | ICD-10-CM | POA: Insufficient documentation

## 2021-05-02 DIAGNOSIS — R197 Diarrhea, unspecified: Secondary | ICD-10-CM | POA: Diagnosis not present

## 2021-05-02 DIAGNOSIS — M109 Gout, unspecified: Secondary | ICD-10-CM | POA: Insufficient documentation

## 2021-05-02 DIAGNOSIS — Z79899 Other long term (current) drug therapy: Secondary | ICD-10-CM | POA: Diagnosis not present

## 2021-05-02 DIAGNOSIS — Z87891 Personal history of nicotine dependence: Secondary | ICD-10-CM | POA: Diagnosis not present

## 2021-05-02 DIAGNOSIS — I10 Essential (primary) hypertension: Secondary | ICD-10-CM | POA: Diagnosis not present

## 2021-05-02 DIAGNOSIS — G629 Polyneuropathy, unspecified: Secondary | ICD-10-CM | POA: Insufficient documentation

## 2021-05-02 DIAGNOSIS — E119 Type 2 diabetes mellitus without complications: Secondary | ICD-10-CM | POA: Insufficient documentation

## 2021-05-02 DIAGNOSIS — Z801 Family history of malignant neoplasm of trachea, bronchus and lung: Secondary | ICD-10-CM | POA: Diagnosis not present

## 2021-05-02 DIAGNOSIS — M549 Dorsalgia, unspecified: Secondary | ICD-10-CM | POA: Insufficient documentation

## 2021-05-02 DIAGNOSIS — Z8673 Personal history of transient ischemic attack (TIA), and cerebral infarction without residual deficits: Secondary | ICD-10-CM | POA: Insufficient documentation

## 2021-05-02 DIAGNOSIS — R5383 Other fatigue: Secondary | ICD-10-CM | POA: Diagnosis not present

## 2021-05-02 DIAGNOSIS — E7800 Pure hypercholesterolemia, unspecified: Secondary | ICD-10-CM | POA: Insufficient documentation

## 2021-05-02 DIAGNOSIS — G8929 Other chronic pain: Secondary | ICD-10-CM | POA: Diagnosis not present

## 2021-05-02 DIAGNOSIS — D520 Dietary folate deficiency anemia: Secondary | ICD-10-CM

## 2021-05-02 DIAGNOSIS — D539 Nutritional anemia, unspecified: Secondary | ICD-10-CM

## 2021-05-02 DIAGNOSIS — E538 Deficiency of other specified B group vitamins: Secondary | ICD-10-CM | POA: Diagnosis not present

## 2021-05-02 DIAGNOSIS — R42 Dizziness and giddiness: Secondary | ICD-10-CM | POA: Diagnosis not present

## 2021-05-02 DIAGNOSIS — G4730 Sleep apnea, unspecified: Secondary | ICD-10-CM | POA: Insufficient documentation

## 2021-05-02 LAB — CBC WITH DIFFERENTIAL/PLATELET
Abs Immature Granulocytes: 0.04 10*3/uL (ref 0.00–0.07)
Basophils Absolute: 0.1 10*3/uL (ref 0.0–0.1)
Basophils Relative: 1 %
Eosinophils Absolute: 0.2 10*3/uL (ref 0.0–0.5)
Eosinophils Relative: 4 %
HCT: 38.6 % — ABNORMAL LOW (ref 39.0–52.0)
Hemoglobin: 12.5 g/dL — ABNORMAL LOW (ref 13.0–17.0)
Immature Granulocytes: 1 %
Lymphocytes Relative: 21 %
Lymphs Abs: 1.2 10*3/uL (ref 0.7–4.0)
MCH: 31 pg (ref 26.0–34.0)
MCHC: 32.4 g/dL (ref 30.0–36.0)
MCV: 95.8 fL (ref 80.0–100.0)
Monocytes Absolute: 0.5 10*3/uL (ref 0.1–1.0)
Monocytes Relative: 8 %
Neutro Abs: 3.7 10*3/uL (ref 1.7–7.7)
Neutrophils Relative %: 65 %
Platelets: 286 10*3/uL (ref 150–400)
RBC: 4.03 MIL/uL — ABNORMAL LOW (ref 4.22–5.81)
RDW: 13.2 % (ref 11.5–15.5)
WBC: 5.7 10*3/uL (ref 4.0–10.5)
nRBC: 0 % (ref 0.0–0.2)

## 2021-05-02 LAB — COMPREHENSIVE METABOLIC PANEL
ALT: 243 U/L — ABNORMAL HIGH (ref 0–44)
AST: 141 U/L — ABNORMAL HIGH (ref 15–41)
Albumin: 4.2 g/dL (ref 3.5–5.0)
Alkaline Phosphatase: 135 U/L — ABNORMAL HIGH (ref 38–126)
Anion gap: 9 (ref 5–15)
BUN: 33 mg/dL — ABNORMAL HIGH (ref 8–23)
CO2: 30 mmol/L (ref 22–32)
Calcium: 9.9 mg/dL (ref 8.9–10.3)
Chloride: 98 mmol/L (ref 98–111)
Creatinine, Ser: 1.57 mg/dL — ABNORMAL HIGH (ref 0.61–1.24)
GFR, Estimated: 49 mL/min — ABNORMAL LOW (ref >60)
Glucose, Bld: 190 mg/dL — ABNORMAL HIGH (ref 70–99)
Potassium: 4 mmol/L (ref 3.5–5.1)
Sodium: 137 mmol/L (ref 135–145)
Total Bilirubin: 0.5 mg/dL (ref 0.3–1.2)
Total Protein: 7.1 g/dL (ref 6.5–8.1)

## 2021-05-02 LAB — IRON AND TIBC
Iron: 94 ug/dL (ref 45–182)
Saturation Ratios: 21 % (ref 17.9–39.5)
TIBC: 451 ug/dL — ABNORMAL HIGH (ref 250–450)
UIBC: 357 ug/dL

## 2021-05-02 LAB — FOLATE: Folate: 21.9 ng/mL (ref >5.9)

## 2021-05-02 LAB — VITAMIN B12: Vitamin B-12: 5550 pg/mL — ABNORMAL HIGH (ref 180–914)

## 2021-05-02 LAB — FERRITIN: Ferritin: 49 ng/mL (ref 24–336)

## 2021-05-02 NOTE — Patient Instructions (Signed)
Louis House at [facility] Discharge Instructions  You were seen and examined today by Dr. Delton Coombes. Dr. Delton Coombes is a medical oncologist, meaning he specializes in cancer diagnoses. Your recent colonoscopy revealed a type of rectal cancer known as Adenocarcinoma.  Dr. Delton Coombes has recommended a MRI to visualize the actual rectal tumor. This scan will tell Dr. Delton Coombes if you need chemotherapy and radiation prior to surgery. Based on the images of the colonoscopy, it is very likely that you will need chemotherapy and radiation prior to surgery. Proceed with the CT scan as scheduled by Dr. Jenetta Downer.   Dr. Delton Coombes has also recommended additional lab tests to see if you need any iron or vitamin supplementation.  Follow-up after the scans with Dr. Delton Coombes   Thank you for choosing [facility] at [facility] to provide your oncology and hematology care.  To afford each patient quality time with our provider, please arrive at least 15 minutes before your scheduled appointment time.   If you have a lab appointment with the [facility] please come in thru the Main Entrance and check in at the main information desk.  You need to re-schedule your appointment should you arrive 10 or more minutes late.  We strive to give you quality time with our providers, and arriving late affects you and other patients whose appointments are after yours.  Also, if you no show three or more times for appointments you may be dismissed from the clinic at the providers discretion.     Again, thank you for choosing [facility].  Our hope is that these requests will decrease the amount of time that you wait before being seen by our physicians.       _____________________________________________________________  Should you have questions after your visit to [facility], please contact our office at [phone] and follow the prompts.  Our  office hours are 8:00 a.m. and 4:30 p.m. Monday - Friday.  Please note that voicemails left after 4:00 p.m. may not be returned until the following business day.  We are closed weekends and major holidays.  You do have access to a nurse 24-7, just call the main number to the clinic [phone] and do not press any options, hold on the line and a nurse will answer the phone.    For prescription refill requests, have your pharmacy contact our office and allow 72 hours.    Due to Covid, you will need to wear a mask upon entering the hospital. If you do not have a mask, a mask will be given to you at the Main Entrance upon arrival. For doctor visits, patients may have 1 support person age 65 or older with them. For treatment visits, patients can not have anyone with them due to social distancing guidelines and our immunocompromised population.

## 2021-05-03 ENCOUNTER — Encounter (HOSPITAL_COMMUNITY): Payer: Self-pay | Admitting: Lab

## 2021-05-03 LAB — CEA: CEA: 1.6 ng/mL (ref 0.0–4.7)

## 2021-05-03 NOTE — Progress Notes (Unsigned)
Referral sent to [facility].  Records faxed on 8/23

## 2021-05-04 ENCOUNTER — Encounter (HOSPITAL_COMMUNITY): Payer: Self-pay

## 2021-05-04 NOTE — Progress Notes (Signed)
LATE Entry:  I met with the patient during and after visit with Dr. Delton Coombes. I introduced myself and explained my role in the patient's care. I provided my contact information and encouraged the patient to call with questions or concerns.  MSI testing requested per Dr. Delton Coombes

## 2021-05-09 ENCOUNTER — Other Ambulatory Visit (INDEPENDENT_AMBULATORY_CARE_PROVIDER_SITE_OTHER): Payer: Self-pay

## 2021-05-09 DIAGNOSIS — C19 Malignant neoplasm of rectosigmoid junction: Secondary | ICD-10-CM

## 2021-05-10 ENCOUNTER — Other Ambulatory Visit: Payer: Self-pay

## 2021-05-10 ENCOUNTER — Encounter (HOSPITAL_COMMUNITY): Payer: Self-pay | Admitting: Anesthesiology

## 2021-05-10 ENCOUNTER — Encounter (HOSPITAL_COMMUNITY)
Admission: RE | Disposition: A | Discharge status: Home / Self Care | Payer: Self-pay | Source: Home / Self Care | Attending: Gastroenterology

## 2021-05-10 ENCOUNTER — Encounter (HOSPITAL_COMMUNITY): Payer: Self-pay | Admitting: Gastroenterology

## 2021-05-10 ENCOUNTER — Ambulatory Visit (HOSPITAL_COMMUNITY)
Admission: RE | Admit: 2021-05-10 | Discharge: 2021-05-10 | Disposition: A | Discharge status: Home / Self Care | Payer: Medicare Other | Attending: Gastroenterology | Admitting: Gastroenterology

## 2021-05-10 ENCOUNTER — Telehealth (INDEPENDENT_AMBULATORY_CARE_PROVIDER_SITE_OTHER): Payer: Self-pay | Admitting: Gastroenterology

## 2021-05-10 DIAGNOSIS — E1140 Type 2 diabetes mellitus with diabetic neuropathy, unspecified: Secondary | ICD-10-CM | POA: Diagnosis not present

## 2021-05-10 DIAGNOSIS — E785 Hyperlipidemia, unspecified: Secondary | ICD-10-CM | POA: Insufficient documentation

## 2021-05-10 DIAGNOSIS — C189 Malignant neoplasm of colon, unspecified: Secondary | ICD-10-CM

## 2021-05-10 DIAGNOSIS — Z79899 Other long term (current) drug therapy: Secondary | ICD-10-CM | POA: Insufficient documentation

## 2021-05-10 DIAGNOSIS — K523 Indeterminate colitis: Secondary | ICD-10-CM | POA: Diagnosis not present

## 2021-05-10 DIAGNOSIS — C19 Malignant neoplasm of rectosigmoid junction: Secondary | ICD-10-CM | POA: Diagnosis present

## 2021-05-10 DIAGNOSIS — M109 Gout, unspecified: Secondary | ICD-10-CM | POA: Diagnosis not present

## 2021-05-10 DIAGNOSIS — Z791 Long term (current) use of non-steroidal anti-inflammatories (NSAID): Secondary | ICD-10-CM | POA: Diagnosis not present

## 2021-05-10 DIAGNOSIS — I10 Essential (primary) hypertension: Secondary | ICD-10-CM | POA: Diagnosis not present

## 2021-05-10 DIAGNOSIS — K52831 Collagenous colitis: Secondary | ICD-10-CM

## 2021-05-10 HISTORY — PX: FLEXIBLE SIGMOIDOSCOPY: SHX5431

## 2021-05-10 SURGERY — SIGMOIDOSCOPY, FLEXIBLE
Anesthesia: Monitor Anesthesia Care

## 2021-05-10 MED ORDER — SPOT INK MARKER SYRINGE KIT
PACK | SUBMUCOSAL | Status: DC | PRN
  Administered 2021-05-10: 7 mL via SUBMUCOSAL

## 2021-05-10 MED ORDER — SPOT INK MARKER SYRINGE KIT
PACK | SUBMUCOSAL | Status: AC
  Filled 2021-05-10: qty 5

## 2021-05-10 MED ORDER — COLESTIPOL HCL 1 G PO TABS: 3.0000 g | ORAL_TABLET | Freq: Every day | ORAL | 0 refills | Status: DC

## 2021-05-10 MED ORDER — SODIUM CHLORIDE 0.9 % IV SOLN: INTRAVENOUS | Status: DC

## 2021-05-10 NOTE — Interval H&P Note (Signed)
History and Physical Interval Note:  05/10/2021 9:38 AM  Louis House is a 65 y.o. male with past medical history of recent diagnosis of moderately differentiated rectal adenocarcinoma, collagenous colitis, diabetes, hypertension, gout, hyperlipidemia and anxiety, who comes to the hospital for a flexible sigmoidoscopy to tattoo the area of rectal cancer.  The patient denies having any complaints.  He is scheduled to see the radiation oncologist on Wednesday to skilled he is radiation session.  I was requested by Dr. Marcello Moores to tattoo the area of the rectum that came back positive for rectal cancer.  HR 75 BP 145/83 RR16 O2 Sat 96% in RA GENERAL: The patient is AO x3, in no acute distress. HEENT: Head is normocephalic and atraumatic. EOMI are intact. Mouth is well hydrated and without lesions. NECK: Supple. No masses LUNGS: Clear to auscultation. No presence of rhonchi/wheezing/rales. Adequate chest expansion HEART: RRR, normal s1 and s2. ABDOMEN: Soft, nontender, no guarding, no peritoneal signs, and nondistended. BS +. No masses. EXTREMITIES: Without any cyanosis, clubbing, rash, lesions or edema. NEUROLOGIC: AOx3, no focal motor deficit. SKIN: no jaundice, no rashes   [name]  has presented today for surgery, with the diagnosis of Colon CA.  The various methods of treatment have been discussed with the patient and family. After consideration of risks, benefits and other options for treatment, the patient has consented to  Procedure(s) with comments: FLEXIBLE SIGMOIDOSCOPY (N/A) - 9:50 ASA 1 Per Dr C no Anesthesia, no PAT - ok per Pamala Hurry as a surgical intervention.  The patient's history has been reviewed, patient examined, no change in status, stable for surgery.  I have reviewed the patient's chart and labs.  Questions were answered to the patient's satisfaction.     [name]

## 2021-05-10 NOTE — Op Note (Signed)
[facility] Patient Name: Louis House Procedure Date: 05/10/2021 9:32 AM MRN: [id number] Date of Birth: 1956/06/25 Attending MD: [name] ,  CSN: [id number] Age: 65 Admit Type: Outpatient Procedure:                Flexible Sigmoidoscopy Indications:              Colorectal cancer Providers:                [name], Janeece Riggers, RN, Raphael Gibney,                            Technician Referring MD:             Leighton Ruff, MD Medicines:                None Complications:            No immediate complications. Estimated Blood Loss:     Estimated blood loss: none. Procedure:                Pre-Anesthesia Assessment:                           - Prior to the procedure, a History and Physical                            was performed, and patient medications, allergies                            and sensitivities were reviewed. The patient's                            tolerance of previous anesthesia was reviewed.                           - The risks and benefits of the procedure and the                            sedation options and risks were discussed with the                            patient. All questions were answered and informed                            consent was obtained.                           - ASA Grade Assessment: III - A patient with severe                            systemic disease.                           After obtaining informed consent, the scope was                            passed under direct vision. The PCF-HQ190L                            (  [id number]) scope was introduced through the anus and                            advanced to the the sigmoid colon. The flexible                            sigmoidoscopy was accomplished without difficulty.                            The patient tolerated the procedure well. The                            quality of the bowel preparation was excellent. Scope In: 9:52:19 AM Scope Out: 9:57:57  AM Total Procedure Duration: 0 hours 5 minutes 38 seconds  Findings:      The perianal and digital rectal examinations were normal.      A fungating and polypoid non-obstructing mass was found at 8 cm proximal       to the anus. The mass was partially circumferential (involving one-third       of the lumen circumference). The mass measured two cm in length. No       bleeding was present. The proximal and distal areas with normal       endoscopic margins were tattooed with an injection of 3 mL of [place] ink       in each location. Impression:               - Malignant tumor at 8 cm proximal to the anus.                            Tattooed.                           - No specimens collected. Moderate Sedation:      Unsedated Recommendation:           - Discharge patient to home (ambulatory).                           - Resume previous diet.                           - Follow up with Dr. Marcello Moores, Delton Coombes and Morris                            for management of rectal cancer.                           - Proceed with scheduled CT chest/abdomen/pelvis                            and MR pelvis. Procedure Code(s):        --- Professional ---                           [phone], Sigmoidoscopy, flexible; with directed  submucosal injection(s), any substance Diagnosis Code(s):        --- Professional ---                           C18.9, Malignant neoplasm of colon, unspecified                           C19, Malignant neoplasm of rectosigmoid junction CPT copyright 2019 American Medical Association. All rights reserved. The codes documented in this report are preliminary and upon coder review may  be revised to meet current compliance requirements. [name], MD [name],  05/10/2021 10:10:13 AM This report has been signed electronically. Number of Addenda: 0

## 2021-05-10 NOTE — Telephone Encounter (Signed)
I called the patient to discuss the scheduling of his flexible sigmoidoscopy for today.  My staff tried to reach him yesterday multiple times and today in the morning but he did not pick up the phone.  I was able to reach his niece and explained importance of undergoing the flexible sigmoidoscopy to tattoo the area as requested by Dr. Marcello Moores.  The niece understood and will let him  know  Thanks

## 2021-05-10 NOTE — Discharge Instructions (Signed)
You are being discharged to home.  Follow up with Dr. Marcello Moores, Delton Coombes and Morris for management of rectal cancer. Proceed with scheduled CT chest/abdomen/pelvis and MR pelvis.

## 2021-05-12 ENCOUNTER — Other Ambulatory Visit: Payer: Self-pay

## 2021-05-12 ENCOUNTER — Ambulatory Visit (HOSPITAL_COMMUNITY)
Admission: RE | Admit: 2021-05-12 | Discharge: 2021-05-12 | Disposition: A | Discharge status: Home / Self Care | Payer: Medicare Other | Source: Ambulatory Visit | Attending: Hematology | Admitting: Hematology

## 2021-05-12 DIAGNOSIS — C20 Malignant neoplasm of rectum: Secondary | ICD-10-CM | POA: Diagnosis present

## 2021-05-12 DIAGNOSIS — C19 Malignant neoplasm of rectosigmoid junction: Secondary | ICD-10-CM | POA: Insufficient documentation

## 2021-05-13 ENCOUNTER — Other Ambulatory Visit: Payer: Self-pay | Admitting: Otolaryngology

## 2021-05-13 ENCOUNTER — Ambulatory Visit (HOSPITAL_COMMUNITY)
Admission: RE | Admit: 2021-05-13 | Discharge: 2021-05-13 | Disposition: A | Discharge status: Home / Self Care | Payer: Medicare Other | Source: Ambulatory Visit | Attending: Gastroenterology | Admitting: Gastroenterology

## 2021-05-13 ENCOUNTER — Other Ambulatory Visit (HOSPITAL_COMMUNITY): Payer: Self-pay | Admitting: Otolaryngology

## 2021-05-13 DIAGNOSIS — R42 Dizziness and giddiness: Secondary | ICD-10-CM

## 2021-05-13 DIAGNOSIS — C19 Malignant neoplasm of rectosigmoid junction: Secondary | ICD-10-CM

## 2021-05-13 DIAGNOSIS — H9041 Sensorineural hearing loss, unilateral, right ear, with unrestricted hearing on the contralateral side: Secondary | ICD-10-CM

## 2021-05-13 MED ORDER — IOHEXOL 350 MG/ML SOLN
60.0000 mL | Freq: Once | INTRAVENOUS | Status: AC | PRN
  Administered 2021-05-13: 60 mL via INTRAVENOUS

## 2021-05-17 NOTE — Progress Notes (Signed)
[place], [place] [address]   CLINIC:  Medical Oncology/Hematology  PCP:  Neale Burly, MD [place] / [place] [id number] [phone]   REASON FOR VISIT:  Follow-up for rectal cancer  PRIOR THERAPY: none  NGS Results: not done  CURRENT THERAPY: under work-up  BRIEF ONCOLOGIC HISTORY:  Oncology History   No history exists.    CANCER STAGING: Cancer Staging Rectal adenocarcinoma [facility]) Staging form: Colon and Rectum, AJCC 8th Edition - Clinical stage from 05/18/2021: Stage IIIB (cT2, cN2a, cM0) - Unsigned  INTERVAL HISTORY:  Louis House, a 65 y.o. male, returns for routine follow-up of his rectal cancer. Louis House was last seen on 05/02/2021.   Today he reports feeling good. He reports diarrhea.  He denies any bleeding per rectum.  No rectal pain reported.  No recent weight loss.  REVIEW OF SYSTEMS:  Review of Systems  Constitutional:  Negative for appetite change and fatigue.  Gastrointestinal:  Positive for diarrhea.  Neurological:  Positive for dizziness.  All other systems reviewed and are negative.  PAST MEDICAL/SURGICAL HISTORY:  Past Medical History:  Diagnosis Date   Anxiety    Diabetes ([place]) 01/12/2017   Diarrhea 02/21/2017   Essential hypertension, benign 01/12/2017   Gout    High cholesterol 01/12/2017   Neuropathy    Sleep apnea    Vertigo    Past Surgical History:  Procedure Laterality Date   BIOPSY  04/06/2017   Procedure: BIOPSY;  Surgeon: Rogene Houston, MD;  Location: [facility];  Service: Endoscopy;;  colon   BIOPSY  04/12/2021   Procedure: BIOPSY;  Surgeon: Montez Morita, Quillian Quince, MD;  Location: [facility];  Service: Gastroenterology;;  nodular area in ascending colon biopsy random colon bx   bone spurs     CATARACT EXTRACTION W/PHACO Left 04/22/2018   Procedure: CATARACT EXTRACTION PHACO AND INTRAOCULAR LENS PLACEMENT LEFT EYE;  Surgeon: Tonny Branch, MD;  Location: [facility] ORS;   Service: Ophthalmology;  Laterality: Left;  CDE: 7.58   COLONOSCOPY WITH PROPOFOL N/A 04/06/2017   Procedure: COLONOSCOPY WITH PROPOFOL;  Surgeon: Rogene Houston, MD;  Location: [facility];  Service: Endoscopy;  Laterality: N/A;  1:45   COLONOSCOPY WITH PROPOFOL N/A 04/12/2021   Procedure: COLONOSCOPY WITH PROPOFOL;  Surgeon: Harvel Quale, MD;  Location: [facility];  Service: Gastroenterology;  Laterality: N/A;  9:05   FLEXIBLE SIGMOIDOSCOPY N/A 05/10/2021   Procedure: FLEXIBLE SIGMOIDOSCOPY;  Surgeon: Harvel Quale, MD;  Location: [facility];  Service: Gastroenterology;  Laterality: N/A;  9:50 ASA 1 Per Dr C no Anesthesia, no PAT - ok per [place] Asper arthroscopy     both knees   Left shoulder surgery from injury     Umblical hernia      SOCIAL HISTORY:  Social History   Socioeconomic History   Marital status: Divorced    Spouse name: Not on file   Number of children: Not on file   Years of education: Not on file   Highest education level: Not on file  Occupational History   Not on file  Tobacco Use   Smoking status: Never   Smokeless tobacco: Never  Vaping Use   Vaping Use: Never used  Substance and Sexual Activity   Alcohol use: No   Drug use: No   Sexual activity: Yes    Birth control/protection: None  Other Topics Concern   Not on file  Social History Narrative   Not on file   Social Determinants of Health   Financial Resource Strain: Low Risk    Difficulty of Paying Living Expenses: Not very hard  Food Insecurity: No Food Insecurity   Worried About Charity fundraiser in the Last Year: Never true   Ran Out of Food in the Last Year: Never true  Transportation Needs: No Transportation Needs   Lack of Transportation (Medical): No   Lack of Transportation (Non-Medical): No  Physical Activity: Insufficiently Active   Days of Exercise per Week: 2 days   Minutes of Exercise per Session: 20 min  Stress: No Stress Concern Present    Feeling of Stress : Not at all  Social Connections: Socially Isolated   Frequency of Communication with Friends and Family: More than three times a week   Frequency of Social Gatherings with Friends and Family: More than three times a week   Attends Religious Services: Never   Marine scientist or Organizations: No   Attends Music therapist: Never   Marital Status: Divorced  Human resources officer Violence: Not At Risk   Fear of Current or Ex-Partner: No   Emotionally Abused: No   Physically Abused: No   Sexually Abused: No    FAMILY HISTORY:  No family history on file.  CURRENT MEDICATIONS:  Current Outpatient Medications  Medication Sig Dispense Refill   alfuzosin (UROXATRAL) 10 MG 24 hr tablet Take 10 mg by mouth at bedtime. 2200     allopurinol (ZYLOPRIM) 300 MG tablet Take 300 mg by mouth daily.     atorvastatin (LIPITOR) 40 MG tablet Take 40 mg by mouth daily.     benazepril (LOTENSIN) 40 MG tablet Take 40 mg by mouth daily.  0   capecitabine (XELODA) 500 MG tablet Take 4 tablets (2,000 mg total) by mouth 2 (two) times daily after a meal. Take only on the days of radiation 224 tablet 0   chlorthalidone (HYGROTON) 50 MG tablet Take 50 mg by mouth daily.     colestipol (COLESTID) 1 g tablet Take 3 tablets (3 g total) by mouth daily. Take 4 hours apart form your other medications 270 tablet 0   Cyanocobalamin (VITAMIN B-12) 5000 MCG SUBL Place 15,000 mcg under the tongue daily.     diazepam (VALIUM) 10 MG tablet Take 10 mg by mouth at bedtime.      diclofenac (VOLTAREN) 75 MG EC tablet Take 75 mg by mouth 2 (two) times daily.     DULoxetine (CYMBALTA) 20 MG capsule Take 20 mg by mouth daily.     fluticasone (FLONASE) 50 MCG/ACT nasal spray Place 3-4 sprays into both nostrils daily.     folic acid (FOLVITE) 1 MG tablet Take 1 mg by mouth daily.     gabapentin (NEURONTIN) 400 MG capsule Take 800 mg by mouth 2 (two) times daily. 400-800 mg as needed for pain      HYDROcodone-acetaminophen (NORCO) 10-325 MG tablet Take 1 tablet by mouth 3 (three) times daily.     loratadine (CLARITIN) 10 MG tablet Take 10 mg by mouth daily as needed for allergies (Seasonal).     meclizine (ANTIVERT) 25 MG tablet Take 25 mg by mouth 3 (three) times daily as needed for dizziness (for flare up of inner ear issues.).      Melatonin 10 MG CAPS Take 30 mg by mouth at bedtime.     Naphazoline-Pheniramine (OPCON-A) 0.027-0.315 % SOLN Place 1 drop into both eyes  daily as needed (irritation).     Omega-3 Fatty Acids (OMEGA-3 FISH OIL PO) Take 2,160 mg by mouth daily. 720 mg per cap     oxymetazoline (AFRIN) 0.05 % nasal spray Place 3 sprays into both nostrils 2 (two) times daily.     prochlorperazine (COMPAZINE) 10 MG tablet Take 1 tablet (10 mg total) by mouth in the morning and at bedtime. Take 25mn before chemotherapy 30 tablet 0   tizanidine (ZANAFLEX) 2 MG capsule Take 2 mg by mouth 3 (three) times daily as needed for muscle spasms.     traZODone (DESYREL) 50 MG tablet Take 50 mg by mouth at bedtime.     Current Facility-Administered Medications  Medication Dose Route Frequency Provider Last Rate Last Admin   dicyclomine (BENTYL) tablet 20 mg  20 mg Oral TID AC & HS Setzer, Terri L, NP        ALLERGIES:  No Known Allergies  PHYSICAL EXAM:  Performance status (ECOG): 0 - Asymptomatic  Vitals:   05/18/21 1041  BP: 124/77  Pulse: 84  Resp: 19  Temp: (!) 97.1 F (36.2 C)  SpO2: 96%   Wt Readings from Last 3 Encounters:  05/18/21 275 lb 8 oz (125 kg)  05/10/21 278 lb (126.1 kg)  05/02/21 279 lb (126.6 kg)   Physical Exam Vitals reviewed.  Constitutional:      Appearance: Normal appearance.  Cardiovascular:     Rate and Rhythm: Normal rate and regular rhythm.     Pulses: Normal pulses.     Heart sounds: Normal heart sounds.  Pulmonary:     Effort: Pulmonary effort is normal.     Breath sounds: Normal breath sounds.  Neurological:     General: No focal  deficit present.     Mental Status: He is alert and oriented to person, place, and time.  Psychiatric:        Mood and Affect: Mood normal.        Behavior: Behavior normal.     LABORATORY DATA:  I have reviewed the labs as listed.  CBC Latest Ref Rng & Units 05/02/2021 02/10/2021 06/19/2019  WBC 4.0 - 10.5 K/uL 5.7 6.6 9.2  Hemoglobin 13.0 - 17.0 g/dL 12.5(L) 11.0(L) 13.6  Hematocrit 39.0 - 52.0 % 38.6(L) 33.2(L) 40.2  Platelets 150 - 400 K/uL 286 274 357   CMP Latest Ref Rng & Units 05/02/2021 02/10/2021 06/19/2019  Glucose 70 - 99 mg/dL 190(H) 93 96  BUN 8 - 23 mg/dL 33(H) 15 39(H)  Creatinine 0.61 - 1.24 mg/dL 1.57(H) 1.21 1.27(H)  Sodium 135 - 145 mmol/L 137 134(L) 136  Potassium 3.5 - 5.1 mmol/L 4.0 4.5 4.4  Chloride 98 - 111 mmol/L 98 94(L) 96(L)  CO2 22 - 32 mmol/L 30 34(H) 34(H)  Calcium 8.9 - 10.3 mg/dL 9.9 10.0 10.2  Total Protein 6.5 - 8.1 g/dL 7.1 6.2 6.8  Total Bilirubin 0.3 - 1.2 mg/dL 0.5 0.4 0.4  Alkaline Phos 38 - 126 U/L 135(H) - -  AST 15 - 41 U/L 141(H) 28 18  ALT 0 - 44 U/L 243(H) 37 24    DIAGNOSTIC IMAGING:  I have independently reviewed the scans and discussed with the patient. MR PELVIS WO CONTRAST  Result Date: 05/13/2021 CLINICAL DATA:  Colorectal cancer, staging evaluation in a 65year old male. EXAM: MRI PELVIS WITHOUT CONTRAST TECHNIQUE: Multiplanar multisequence MR imaging of the pelvis was performed. No intravenous contrast was administered. Patient refused rectal gel. COMPARISON:  CT of the chest abdomen and  pelvis from May 13, 2021 and CT of the abdomen and pelvis from 2018. FINDINGS: TUMOR LOCATION No tumor is confidently identified within the rectum on the current exam. There is some eccentric thickening suggested along the posterior wall of the rectum approximately 10 cm from the anus and approximately 5 cm from the sphincter complex. There is some variable diffusion signal in this location but there is no definable mass in the absence of rectal  distension Very small mesorectal lymph nodes are noted adjacent to this location largest approximately 5 mm along the LEFT lateral rectum there is no stranding in the perirectal fat and no findings to suggest extension beyond the muscularis of any underlying lesion. TUMOR DESCRIPTION Circumferential Extent: Can not be assessed. Tumor Length: Not assessed. T - CATEGORY Extension through Muscularis Propria: No, likely T2 lesion though can not be assessed. No signs of extension beyond the muscularis. Shortest Distance of any tumor/node from Mesorectal Fascia: Closest potential distance 4 lymph nodes 6-7 mm (image 14/8) 5 mm mesorectal lymph node along the LEFT mesorectum. Mm Extramural Vascular Invasion/Tumor Thrombus: No Invasion of Anterior Peritoneal Reflection: No Involvement of Adjacent Organs or Pelvic Sidewall: No Levator Ani Involvement: No N - CATEGORY Mesorectal Lymph Nodes >=63m: Superior rectal lymph nodes with low T2 signal measuring between 7 and 8 mm (image 7 and image 3 of series 4. Two mesorectal lymph nodes along the LEFT aspect of the rectum at the 5 mm range on image 26 and image 19 of series 3. Suspicious for into disease overall. Extra-mesorectal Lymphadenopathy: No Other:  None. IMPRESSION: Rectal adenocarcinoma T stage: Likely T2 but lesion is not visualized for definitive assessment. No signs of extension into the perirectal fat. Rectal adenocarcinoma N stage: 4 small nodes 5 mm or greater suspicious for N2 disease. Can not be definitively assessed. Electronically Signed   By: GZetta BillsM.D.   On: 05/13/2021 16:57   CT CHEST ABDOMEN PELVIS W CONTRAST  Result Date: 05/13/2021 CLINICAL DATA:  65year old male with history of colorectal neoplasm. EXAM: CT CHEST, ABDOMEN, AND PELVIS WITH CONTRAST TECHNIQUE: Multidetector CT imaging of the chest, abdomen and pelvis was performed following the standard protocol during bolus administration of intravenous contrast. CONTRAST:  653mOMNIPAQUE  IOHEXOL 350 MG/ML SOLN COMPARISON:  Jan 18, 2017. FINDINGS: CT CHEST FINDINGS Cardiovascular: Calcified atheromatous plaque of the thoracic aorta. No aneurysmal dilation. Normal heart size without substantial pericardial effusion. Three-vessel coronary artery disease. Normal caliber central pulmonary vessels. Mediastinum/Nodes: Esophagus grossly normal. No thoracic inlet lymphadenopathy. No axillary lymphadenopathy. No hilar lymphadenopathy. No mediastinal adenopathy. Lungs/Pleura: Azygous fissure incidentally noted in the RIGHT chest. No consolidation. No pleural effusion. Mild lingular scarring. Airways are patent. Musculoskeletal: See below for full musculoskeletal details. CT ABDOMEN PELVIS FINDINGS Hepatobiliary: Mild steatosis. Mild fissural widening of hepatic fissures. No focal, suspicious hepatic lesion. No pericholecystic stranding. Pancreas: Normal, without mass, inflammation or ductal dilatation. Spleen: Spleen normal in size and contour.  No focal lesion. Adrenals/Urinary Tract: Adrenal glands are normal. Symmetric renal enhancement. No sign of hydronephrosis. No perinephric or Peri ureteral stranding. No perivesical stranding. Stomach/Bowel: Stomach is under distended no perigastric stranding. No sign of acute small bowel process. Colonic diverticulosis. Normal appendix. No signs of pericolonic stranding. Sigmoid diverticular disease with diffuse thickening of the sigmoid. Definite rectal lesion not seen. Some mild thickening in the low to mid rectum on image 136 of series 4 could potentially correspond to the site of disease though there is no perirectal stranding on the current  study. Vascular/Lymphatic: Aortic atherosclerosis. No sign of aneurysm. Smooth contour of the IVC. There is no gastrohepatic or hepatoduodenal ligament lymphadenopathy. No retroperitoneal or mesenteric lymphadenopathy. No pelvic sidewall lymphadenopathy. Vascular structures in the abdomen and pelvis are grossly patent on  venous phase. No soft tissue nodularity adjacent to the rectum or gross evidence of adenopathy along the RIGHT or LEFT pelvic sidewall or within the mesorectum. Reproductive: Moderate prostatomegaly. Other: No ascites. Musculoskeletal: No acute bone finding or destructive bone process. IMPRESSION: No definite rectal lesion. Some mild thickening in the low to mid rectum could potentially correspond to the site of disease though there is no perirectal stranding on the current study. No evidence of metastatic disease in the chest, abdomen or pelvis. Mild steatosis with fissural widening of hepatic fissures. Correlate with any clinical or laboratory evidence of liver disease. Colonic diverticulosis without evidence of acute diverticulitis. Moderate prostatomegaly. Three-vessel coronary artery disease. Aortic atherosclerosis. Electronically Signed   By: Zetta Bills M.D.   On: 05/13/2021 16:34     ASSESSMENT:  Rectal cancer: - He reported diarrhea which started in May.  Prior colonoscopy was in 2018. - Colonoscopy on 04/12/2021 showed fungating polypoid nonobstructing mass with central depression found at 8 cm from the anal verge.  Mass was noncircumferential, measured 2 cm in length.  Nodular mucosa in the proximal ascending colon which was biopsied. - Pathology of the rectal mass consistent with adenocarcinoma.  Nodular area in the ascending colon was consistent with collagenous colitis.  MMR preserved.  MSI stable.  2.  Social/family history: - He lives at home by himself.  Daughter lives in [place]. - He worked at [employer] in [place] prior to retirement.  Non-smoker. - Sister had lung cancer and was a smoker.  No family history of colon cancer.   PLAN:  Stage III (T2N2) rectal adenocarcinoma, MSI stable: - We reviewed results of CT CAP from 05/13/2021.  No evidence of metastatic disease in the chest, abdomen or pelvis.  Mild hepatic steatosis. - Reviewed MRI of the pelvis without contrast  from 05/12/2021.  T2N2 by MRI staging.  4 small lymph nodes 5 mm or greater. - I have discussed with Dr. Marcello Moores.  Total neoadjuvant therapy was recommended. - He will start with long course radiation with Xeloda followed by 4 months of FOLFOX followed by surgery. - We talked about Xeloda (825 mg per metered squared) 4 tablets twice daily Monday through Friday throughout the course of radiation.  He will take Compazine 30 minutes prior to Xeloda. - We discussed side effects in detail.  Literature was given to him. - I have also discussed the plan with Dr. Lynnette Caffey.  He will follow-up with radiation oncology for simulation scan. - RTC on the day of start of radiation therapy.  2.  Normocytic anemia: - His latest hemoglobin improved to 12.5 with MCV 95.  Ferritin was 49 with percent saturation 21.  Folic acid and Z61 were normal.  3.  Diarrhea: - Continue colestipol 3 g daily.  4.  Chronic back pain: - Continue hydrocodone 10/325 3 times daily as needed.   Orders placed this encounter:  No orders of the defined types were placed in this encounter.  Total time spent is 40 minutes with more than 50% of the time spent face-to-face discussing scan results, staging, treatment plan, counseling and coordination of care.  Derek Jack, MD [place] [phone]   I, Thana Ates, am acting as a scribe for Dr. Derek Jack.  I, Derek Jack MD, have reviewed the above documentation for accuracy and completeness, and I agree with the above.

## 2021-05-18 ENCOUNTER — Telehealth (HOSPITAL_COMMUNITY): Payer: Self-pay | Admitting: Pharmacy Technician

## 2021-05-18 ENCOUNTER — Other Ambulatory Visit (HOSPITAL_COMMUNITY): Payer: Self-pay

## 2021-05-18 ENCOUNTER — Inpatient Hospital Stay (HOSPITAL_COMMUNITY): Payer: Medicare Other | Attending: Hematology | Admitting: Hematology

## 2021-05-18 ENCOUNTER — Telehealth (HOSPITAL_COMMUNITY): Payer: Self-pay | Admitting: Pharmacist

## 2021-05-18 ENCOUNTER — Encounter (HOSPITAL_COMMUNITY): Payer: Self-pay | Admitting: Gastroenterology

## 2021-05-18 ENCOUNTER — Other Ambulatory Visit: Payer: Self-pay

## 2021-05-18 VITALS — BP 124/77 | HR 84 | Temp 97.1°F | Resp 19 | Wt 275.5 lb

## 2021-05-18 DIAGNOSIS — R600 Localized edema: Secondary | ICD-10-CM | POA: Diagnosis not present

## 2021-05-18 DIAGNOSIS — I700 Atherosclerosis of aorta: Secondary | ICD-10-CM | POA: Diagnosis not present

## 2021-05-18 DIAGNOSIS — R42 Dizziness and giddiness: Secondary | ICD-10-CM | POA: Diagnosis not present

## 2021-05-18 DIAGNOSIS — Z79899 Other long term (current) drug therapy: Secondary | ICD-10-CM | POA: Diagnosis not present

## 2021-05-18 DIAGNOSIS — K5730 Diverticulosis of large intestine without perforation or abscess without bleeding: Secondary | ICD-10-CM | POA: Insufficient documentation

## 2021-05-18 DIAGNOSIS — E7800 Pure hypercholesterolemia, unspecified: Secondary | ICD-10-CM | POA: Insufficient documentation

## 2021-05-18 DIAGNOSIS — C20 Malignant neoplasm of rectum: Secondary | ICD-10-CM | POA: Insufficient documentation

## 2021-05-18 DIAGNOSIS — M549 Dorsalgia, unspecified: Secondary | ICD-10-CM | POA: Insufficient documentation

## 2021-05-18 DIAGNOSIS — N400 Enlarged prostate without lower urinary tract symptoms: Secondary | ICD-10-CM | POA: Diagnosis not present

## 2021-05-18 DIAGNOSIS — D520 Dietary folate deficiency anemia: Secondary | ICD-10-CM | POA: Diagnosis not present

## 2021-05-18 DIAGNOSIS — D539 Nutritional anemia, unspecified: Secondary | ICD-10-CM | POA: Diagnosis not present

## 2021-05-18 DIAGNOSIS — I10 Essential (primary) hypertension: Secondary | ICD-10-CM | POA: Diagnosis not present

## 2021-05-18 DIAGNOSIS — G629 Polyneuropathy, unspecified: Secondary | ICD-10-CM | POA: Insufficient documentation

## 2021-05-18 DIAGNOSIS — G4730 Sleep apnea, unspecified: Secondary | ICD-10-CM | POA: Diagnosis not present

## 2021-05-18 DIAGNOSIS — I2510 Atherosclerotic heart disease of native coronary artery without angina pectoris: Secondary | ICD-10-CM | POA: Diagnosis not present

## 2021-05-18 DIAGNOSIS — G8929 Other chronic pain: Secondary | ICD-10-CM | POA: Insufficient documentation

## 2021-05-18 DIAGNOSIS — E119 Type 2 diabetes mellitus without complications: Secondary | ICD-10-CM | POA: Diagnosis not present

## 2021-05-18 DIAGNOSIS — R197 Diarrhea, unspecified: Secondary | ICD-10-CM | POA: Diagnosis not present

## 2021-05-18 DIAGNOSIS — D649 Anemia, unspecified: Secondary | ICD-10-CM | POA: Diagnosis not present

## 2021-05-18 DIAGNOSIS — M109 Gout, unspecified: Secondary | ICD-10-CM | POA: Diagnosis not present

## 2021-05-18 DIAGNOSIS — F419 Anxiety disorder, unspecified: Secondary | ICD-10-CM | POA: Insufficient documentation

## 2021-05-18 DIAGNOSIS — K760 Fatty (change of) liver, not elsewhere classified: Secondary | ICD-10-CM | POA: Insufficient documentation

## 2021-05-18 MED ORDER — PROCHLORPERAZINE MALEATE 10 MG PO TABS: 10.0000 mg | ORAL_TABLET | Freq: Two times a day (BID) | ORAL | 0 refills | Status: DC

## 2021-05-18 MED ORDER — CAPECITABINE 500 MG PO TABS
2000.0000 mg | ORAL_TABLET | Freq: Two times a day (BID) | ORAL | 0 refills | Status: DC
  Filled 2021-05-18: qty 224, 28d supply, fill #0
  Filled 2021-05-18: qty 160, 28d supply, fill #0

## 2021-05-18 NOTE — Telephone Encounter (Signed)
Oral Oncology Patient Advocate Encounter   Received notification from [facility] that prior authorization for Xeloda is required.   PA submitted on CoverMyMeds Key [id number] Status is pending   Oral Oncology Clinic will continue to follow.  [place] Patient [place] Phone [phone] Fax [phone] 05/18/2021 11:51 AM

## 2021-05-18 NOTE — Telephone Encounter (Signed)
Oral Chemotherapy Pharmacist Encounter  Patient has an upcoming appt with [facility] radiation oncology but no XRT start date yet. He knows to hold on starting the capecitabine until the first day of XRT treatment.  Patient Education I spoke with patient for overview of new oral chemotherapy medication: Xeloda (capecitabine) for the treatment of rectal cancer in conjunction with XRT, planned duration until the end of radiation.  Pt is doing well. Counseled patient on administration, dosing, side effects, monitoring, drug-food interactions, safe handling, storage, and disposal. Patient will take 4 tablets (2,000 mg total) by mouth 2 (two) times daily after a meal. Take only on the days of radiation.  Side effects include but not limited to: diarrhea, hand-foot syndrome, mouth sores, edema, decreased wbc, fatigue, N/V.   Diarrhea: he is currently having diarrhea, he said he was not sure he could take loperamide. He is having more than loose a stools a day currently. He will start the loperamide now. He knows to call the office if he continues to have 4 or more loose stools a days while taking the loperamide.  Mouth sores: he knows to call the office for magic mouth wash if this occurs Hand-foot syndrome: Encouraged Mr. Padget to keeps his hands and feet moisturized N/V: He plans to pick up the prescription sent in for prochlorperazine and taking it prior to the capecitabine per the office instructions   DDIs Reviewed drug-drug interactions with Mr. Arnette.  Allopurinol: He reports he only had one incidence of gout and was started on allopurinol he does not report any flares since. He will stop his allopurinol today and call me if he has trouble with gout off of the allopurinol Folic acid: He will stop the folic acid while on the capecitabine Both medications removed from his medication list.  Reviewed with patient importance of keeping a medication schedule and plan for any missed doses.  After  discussion with patient no patient barriers to medication adherence identified.   Mr. Soy voiced understanding and appreciation. All questions answered. Medication handout provided.  Provided patient with [facility] phone number. Patient knows to call the office with questions or concerns. Oral Chemotherapy Navigation Clinic will continue to follow.  Darl Pikes, PharmD, BCPS, BCOP, CPP Hematology/Oncology Clinical Pharmacist Practitioner [facility] [phone]  05/18/2021 1:34 PM

## 2021-05-18 NOTE — Patient Instructions (Addendum)
[place] at [facility] Discharge Instructions  You were seen today by Dr. Delton Coombes. He went over your recent results and scans.   Dr. Delton Coombes has recommended chemotherapy and radiation prior to surgery. Dr. Delton Coombes will see you back in for labs and follow up.  Dr. Delton Coombes has prescribed Xeloda. Take 4 tablets twice daily only on the days that you receive radiation. Take compazine (anti-nausea medication) 30 minutes before taking the Xeloda.   Thank you for choosing [place] at [facility] to provide your oncology and hematology care.  To afford each patient quality time with our provider, please arrive at least 15 minutes before your scheduled appointment time.   If you have a lab appointment with the [facility] please come in thru the Main Entrance and check in at the main information desk  You need to re-schedule your appointment should you arrive 10 or more minutes late.  We strive to give you quality time with our providers, and arriving late affects you and other patients whose appointments are after yours.  Also, if you no show three or more times for appointments you may be dismissed from the clinic at the providers discretion.     Again, thank you for choosing [facility].  Our hope is that these requests will decrease the amount of time that you wait before being seen by our physicians.       _____________________________________________________________  Should you have questions after your visit to [facility], please contact our office at [phone] between the hours of 8:00 a.m. and 4:30 p.m.  Voicemails left after 4:00 p.m. will not be returned until the following business day.  For prescription refill requests, have your pharmacy contact our office and allow 72 hours.    Cancer Center Support Programs:   > Cancer Support Group  2nd Tuesday of the month 1pm-2pm, Journey Room

## 2021-05-18 NOTE — Telephone Encounter (Signed)
Oral Oncology Pharmacist Encounter  Received new prescription for Xeloda (capecitabine) for the treatment of rectal cancer in conjunction with XRT, planned duration until the end of radiation.  CMP from 05/02/21 assessed, no relevant lab abnormalities. Prescription dose and frequency assessed.   Current medication list in Epic reviewed, a few DDIs with capecitabine identified: Allopurinol: Allopurinol may decrease serum concentrations of capecitabine. It is recommended to avoid this combination. Recommend stopping the allopurinol while on capecitabine Folic acid: Folic Acid may enhance the adverse/toxic effect of capecitabine. Recommend avoiding the combination by stopping the folic acid while on capecitabine  Evaluated chart and no patient barriers to medication adherence identified.   Prescription has been e-scribed to the [facility] for benefits analysis and approval.  Oral Oncology Clinic will continue to follow for insurance authorization, copayment issues, initial counseling and start date.   Darl Pikes, PharmD, BCPS, BCOP, CPP Hematology/Oncology Clinical Pharmacist Practitioner [facility] [phone]  05/18/2021 11:42 AM

## 2021-05-18 NOTE — Telephone Encounter (Signed)
Oral Oncology Patient Advocate Encounter  [facility] faxed notification that Part B would need to be billed for this medication.   Patients co-pay is $0.00 with patients Medicare Supplement.  Oral Oncology Clinic will continue to follow.   [place] Patient [place] Phone [phone] Fax [phone] 05/18/2021 1:38 PM

## 2021-05-19 ENCOUNTER — Other Ambulatory Visit (HOSPITAL_COMMUNITY): Payer: Self-pay

## 2021-05-19 ENCOUNTER — Encounter (HOSPITAL_COMMUNITY): Payer: Self-pay | Admitting: Hematology

## 2021-05-19 NOTE — Progress Notes (Signed)
START ON PATHWAY REGIMEN - Colorectal     A cycle is every 7 days:     Capecitabine   **Always confirm dose/schedule in your pharmacy ordering system**  Patient Characteristics: Preoperative or Nonsurgical Candidate (Clinical Staging), Rectal, cT3 - cT4, cN0 or Any cT, cN+ Tumor Location: Rectal Therapeutic Status: Preoperative or Nonsurgical Candidate (Clinical Staging) AJCC T Category: cT2 AJCC N Category: cN2a AJCC M Category: cM0 AJCC 8 Stage Grouping: IIIB Intent of Therapy: Curative Intent, Discussed with Patient

## 2021-05-24 ENCOUNTER — Other Ambulatory Visit (HOSPITAL_COMMUNITY): Payer: Self-pay

## 2021-05-24 NOTE — Telephone Encounter (Signed)
Oral Oncology Patient Advocate Encounter  I spoke with Mr Bunch on 05/19/21 to set up delivery of Xeloda.  Address verified for shipment.  Xeloda was filled through [facility] and mailed 05/19/21 for delivery 05/20/21.    [place]ao will call 7-10 days before next refill is due to complete adherence call and set up delivery of medication.     [facility] Patient Vivian Phone [phone] Fax [phone] 05/24/2021 9:08 AM

## 2021-05-25 ENCOUNTER — Ambulatory Visit (HOSPITAL_COMMUNITY)
Admission: RE | Admit: 2021-05-25 | Discharge: 2021-05-25 | Disposition: A | Discharge status: Home / Self Care | Payer: Medicare Other | Source: Ambulatory Visit | Attending: Otolaryngology | Admitting: Otolaryngology

## 2021-05-25 ENCOUNTER — Other Ambulatory Visit: Payer: Self-pay

## 2021-05-25 DIAGNOSIS — H9041 Sensorineural hearing loss, unilateral, right ear, with unrestricted hearing on the contralateral side: Secondary | ICD-10-CM

## 2021-05-25 DIAGNOSIS — R42 Dizziness and giddiness: Secondary | ICD-10-CM

## 2021-05-25 MED ORDER — GADOBUTROL 1 MMOL/ML IV SOLN
10.0000 mL | Freq: Once | INTRAVENOUS | Status: AC | PRN
  Administered 2021-05-25: 10 mL via INTRAVENOUS

## 2021-05-26 ENCOUNTER — Telehealth (INDEPENDENT_AMBULATORY_CARE_PROVIDER_SITE_OTHER): Payer: Self-pay

## 2021-05-26 NOTE — Telephone Encounter (Signed)
Patient called today stating he has been having issues with loose stools he is on colestipol three po Qd and for the last week been taking imodium two tablets in addition to the colestipol. He states he had 7 bms the first few hours of being awake yesterday and even had some bowel incontinence. He reports he sees oncology and will be starting chemo next week he states they informed him that he could have some issues with diarrhea while on it. He would like to know what else he can do to help get these loose stools solid. Please advise.

## 2021-05-27 ENCOUNTER — Encounter (INDEPENDENT_AMBULATORY_CARE_PROVIDER_SITE_OTHER): Payer: Self-pay

## 2021-05-27 NOTE — Telephone Encounter (Signed)
Patient aware of all.

## 2021-05-27 NOTE — Telephone Encounter (Signed)
Per Dr. Laural Golden patient can take Imodium 2 mg bid on Schedule.

## 2021-06-06 ENCOUNTER — Other Ambulatory Visit (HOSPITAL_COMMUNITY): Payer: Self-pay

## 2021-06-06 DIAGNOSIS — C20 Malignant neoplasm of rectum: Secondary | ICD-10-CM

## 2021-06-06 NOTE — Progress Notes (Signed)
Standing orders placed for CBCd, CMP, Mag weekly while receiving Xeloda and Radiation therapy. And CEA monthly until otherwise noted per Dr. Delton Coombes

## 2021-06-08 NOTE — Progress Notes (Signed)
Louis House, Louis House [address]   CLINIC:  Medical Oncology/Hematology  PCP:  Neale Burly, MD [place] [id number] [phone]   REASON FOR VISIT:  Follow-up for rectal cancer  PRIOR THERAPY: none  NGS Results: not done  CURRENT THERAPY: Capecitabine + XRT  BRIEF ONCOLOGIC HISTORY:  Oncology History  Rectal adenocarcinoma ([place])  05/01/2021 Initial Diagnosis   Rectal adenocarcinoma ([place])   05/30/2021 -  Chemotherapy    Patient is on Treatment Plan: COLORECTAL CAPECITABINE + XRT         CANCER STAGING: Cancer Staging Rectal adenocarcinoma ([place]) Staging form: Colon and Rectum, AJCC 8th Edition - Clinical stage from 05/18/2021: Stage IIIB (cT2, cN2a, cM0) - Unsigned   INTERVAL HISTORY:  Louis House, a 65 y.o. male, returns for routine follow-up of his rectal cancer. Louis House was last seen on 05/18/2021.   Today he reports feeling well. He reports continued back pain. He is taking Xeloda every 12 hours and tolerating it well; he started 06/08/2021.   REVIEW OF SYSTEMS:  Review of Systems  Constitutional:  Positive for fatigue (50%). Negative for appetite change.  Musculoskeletal:  Positive for back pain. Negative for arthralgias and myalgias.  All other systems reviewed and are negative.  PAST MEDICAL/SURGICAL HISTORY:  Past Medical History:  Diagnosis Date   Anxiety    Diabetes ([place]) 01/12/2017   Diarrhea 02/21/2017   Essential hypertension, benign 01/12/2017   Gout    High cholesterol 01/12/2017   Neuropathy    Sleep apnea    Vertigo    Past Surgical History:  Procedure Laterality Date   BIOPSY  04/06/2017   Procedure: BIOPSY;  Surgeon: Rogene Houston, MD;  Location: [facility];  Service: Endoscopy;;  colon   BIOPSY  04/12/2021   Procedure: BIOPSY;  Surgeon: Montez Morita, Quillian Quince, MD;  Location: [facility];  Service: Gastroenterology;;  nodular area in ascending colon biopsy random  colon bx   bone spurs     CATARACT EXTRACTION W/PHACO Left 04/22/2018   Procedure: CATARACT EXTRACTION PHACO AND INTRAOCULAR LENS PLACEMENT LEFT EYE;  Surgeon: Tonny Branch, MD;  Location: [facility];  Service: Ophthalmology;  Laterality: Left;  CDE: 7.58   COLONOSCOPY WITH PROPOFOL N/A 04/06/2017   Procedure: COLONOSCOPY WITH PROPOFOL;  Surgeon: Rogene Houston, MD;  [facility]: [facility];  Service: Endoscopy;  Laterality: N/A;  1:45   COLONOSCOPY WITH PROPOFOL N/A 04/12/2021   Procedure: COLONOSCOPY WITH PROPOFOL;  Surgeon: Harvel Quale, MD;  [facility];  Service: Gastroenterology;  Laterality: N/A;  9:05   FLEXIBLE SIGMOIDOSCOPY N/A 05/10/2021   Procedure: FLEXIBLE SIGMOIDOSCOPY;  Surgeon: Harvel Quale, MD;  [facility];  Service: Gastroenterology;  Laterality: N/A;  9:50 ASA 1 Per Dr C no Anesthesia, no PAT - ok per Cecilio Asper arthroscopy     both knees   Left shoulder surgery from injury     Umblical hernia      SOCIAL HISTORY:  Social History   Socioeconomic History   Marital status: Divorced    Spouse name: Not on file   Number of children: Not on file   Years of education: Not on file   Highest education level: Not on file  Occupational History   Not on file  Tobacco Use   Smoking status: Never   Smokeless tobacco: Never  Vaping Use   Vaping Use: Never used  Substance and Sexual Activity   Alcohol use: No   Drug use: No   Sexual activity: Yes    Birth control/protection: None  Other Topics Concern   Not on file  Social History Narrative   Not on file   Social Determinants of Health   Financial Resource Strain: Low Risk    Difficulty of Paying Living Expenses: Not very hard  Food Insecurity: No Food Insecurity   Worried About Charity fundraiser in the Last Year: Never true   Ran Out of Food in the Last Year: Never true  Transportation Needs: No Transportation Needs   Lack of Transportation (Medical): No   Lack  of Transportation (Non-Medical): No  Physical Activity: Insufficiently Active   Days of Exercise per Week: 2 days   Minutes of Exercise per Session: 20 min  Stress: No Stress Concern Present   Feeling of Stress : Not at all  Social Connections: Socially Isolated   Frequency of Communication with Friends and Family: More than three times a week   Frequency of Social Gatherings with Friends and Family: More than three times a week   Attends Religious Services: Never   Marine scientist or Organizations: No   Attends Music therapist: Never   Marital Status: Divorced  Human resources officer Violence: Not At Risk   Fear of Current or Ex-Partner: No   Emotionally Abused: No   Physically Abused: No   Sexually Abused: No    FAMILY HISTORY:  No family history on file.  CURRENT MEDICATIONS:  Current Outpatient Medications  Medication Sig Dispense Refill   alfuzosin (UROXATRAL) 10 MG 24 hr tablet Take 10 mg by mouth at bedtime. 2200     atorvastatin (LIPITOR) 40 MG tablet Take 40 mg by mouth daily.     benazepril (LOTENSIN) 40 MG tablet Take 40 mg by mouth daily.  0   capecitabine (XELODA) 500 MG tablet Take 4 tablets (2,000 mg total) by mouth 2 (two) times daily after a meal. Take only on the days of radiation 224 tablet 0   chlorthalidone (HYGROTON) 50 MG tablet Take 50 mg by mouth daily.     colestipol (COLESTID) 1 g tablet Take 3 tablets (3 g total) by mouth daily. Take 4 hours apart form your other medications 270 tablet 0   Cyanocobalamin (VITAMIN B-12) 5000 MCG SUBL Place 15,000 mcg under the tongue daily.     diazepam (VALIUM) 10 MG tablet Take 10 mg by mouth at bedtime.      diclofenac (VOLTAREN) 75 MG EC tablet Take 75 mg by mouth 2 (two) times daily.     DULoxetine (CYMBALTA) 20 MG capsule Take 20 mg by mouth daily.     fluticasone (FLONASE) 50 MCG/ACT nasal spray Place 3-4 sprays into both nostrils daily.     gabapentin (NEURONTIN) 400 MG capsule Take 800 mg by  mouth 2 (two) times daily. 400-800 mg as needed for pain     HYDROcodone-acetaminophen (NORCO) 10-325 MG tablet Take 1 tablet by mouth 3 (three) times daily.     loperamide (IMODIUM) 2 MG capsule Take 2 mg by mouth in the morning and at bedtime. Use on schedule per Dr. Laural Golden.     loratadine (CLARITIN) 10 MG tablet Take 10 mg by mouth daily as needed for allergies (Seasonal).     meclizine (ANTIVERT) 25 MG tablet Take 25 mg by mouth 3 (three) times daily as needed for dizziness (for flare up of inner ear issues.).  Melatonin 10 MG CAPS Take 30 mg by mouth at bedtime.     Naphazoline-Pheniramine (OPCON-A) 0.027-0.315 % SOLN Place 1 drop into both eyes daily as needed (irritation).     Omega-3 Fatty Acids (OMEGA-3 FISH OIL PO) Take 2,160 mg by mouth daily. 720 mg per cap     oxymetazoline (AFRIN) 0.05 % nasal spray Place 3 sprays into both nostrils 2 (two) times daily.     prochlorperazine (COMPAZINE) 10 MG tablet Take 1 tablet (10 mg total) by mouth in the morning and at bedtime. Take 70mn before chemotherapy 30 tablet 0   tizanidine (ZANAFLEX) 2 MG capsule Take 2 mg by mouth 3 (three) times daily as needed for muscle spasms.     traZODone (DESYREL) 50 MG tablet Take 50 mg by mouth at bedtime.     Current Facility-Administered Medications  Medication Dose Route Frequency Provider Last Rate Last Admin   dicyclomine (BENTYL) tablet 20 mg  20 mg Oral TID AC & HS Setzer, Terri L, NP        ALLERGIES:  No Known Allergies  PHYSICAL EXAM:  Performance status (ECOG): 0 - Asymptomatic  There were no vitals filed for this visit. Wt Readings from Last 3 Encounters:  05/18/21 275 lb 8 oz (125 kg)  05/10/21 278 lb (126.1 kg)  05/02/21 279 lb (126.6 kg)   Physical Exam Vitals reviewed.  Constitutional:      Appearance: Normal appearance.  HENT:     Mouth/Throat:     Mouth: Mucous membranes are moist. No oral lesions.  Cardiovascular:     Rate and Rhythm: Normal rate and regular rhythm.      Pulses: Normal pulses.     Heart sounds: Normal heart sounds.  Pulmonary:     Effort: Pulmonary effort is normal.     Breath sounds: Normal breath sounds.  Neurological:     General: No focal deficit present.     Mental Status: He is alert and oriented to person, place, and time.  Psychiatric:        Mood and Affect: Mood normal.        Behavior: Behavior normal.     LABORATORY DATA:  I have reviewed the labs as listed.  CBC Latest Ref Rng & Units 05/02/2021 02/10/2021 06/19/2019  WBC 4.0 - 10.5 K/uL 5.7 6.6 9.2  Hemoglobin 13.0 - 17.0 g/dL 12.5(L) 11.0(L) 13.6  Hematocrit 39.0 - 52.0 % 38.6(L) 33.2(L) 40.2  Platelets 150 - 400 K/uL 286 274 357   CMP Latest Ref Rng & Units 05/02/2021 02/10/2021 06/19/2019  Glucose 70 - 99 mg/dL 190(H) 93 96  BUN 8 - 23 mg/dL 33(H) 15 39(H)  Creatinine 0.61 - 1.24 mg/dL 1.57(H) 1.21 1.27(H)  Sodium 135 - 145 mmol/L 137 134(L) 136  Potassium 3.5 - 5.1 mmol/L 4.0 4.5 4.4  Chloride 98 - 111 mmol/L 98 94(L) 96(L)  CO2 22 - 32 mmol/L 30 34(H) 34(H)  Calcium 8.9 - 10.3 mg/dL 9.9 10.0 10.2  Total Protein 6.5 - 8.1 g/dL 7.1 6.2 6.8  Total Bilirubin 0.3 - 1.2 mg/dL 0.5 0.4 0.4  Alkaline Phos 38 - 126 U/L 135(H) - -  AST 15 - 41 U/L 141(H) 28 18  ALT 0 - 44 U/L 243(H) 37 24    DIAGNOSTIC IMAGING:  I have independently reviewed the scans and discussed with the patient. MR BRAIN W WO CONTRAST  Result Date: 05/25/2021 CLINICAL DATA:  Dizziness.  Asymmetric hearing loss left ear EXAM: MRI HEAD WITHOUT AND  WITH CONTRAST TECHNIQUE: Multiplanar, multiecho pulse sequences of the brain and surrounding structures were obtained without and with intravenous contrast. CONTRAST:  15m GADAVIST GADOBUTROL 1 MMOL/ML IV SOLN COMPARISON:  None. FINDINGS: Brain: IAC protocol with thin sections through the posterior fossa. Negative for vestibular schwannoma. Seventh and eighth cranial nerves normal. Basilar cisterns normal. Brainstem and cerebellum normal. Ventricle size and  cerebral volume normal. Mild white matter changes with patchy periventricular deep white matter hyperintensities bilaterally. Negative for acute infarct, hemorrhage, mass. Vascular: Normal arterial flow voids Skull and upper cervical spine: Negative Sinuses/Orbits: Complete opacification left maxillary sinus. Mild mucosal edema in the remainder of the sinuses. Left mastoid effusion. Other: None IMPRESSION: No intracranial mass lesion. Mild white matter changes consistent with chronic microvascular ischemia Complete opacification left maxillary sinus.  Left mastoid effusion. Electronically Signed   By: CFranchot GalloM.D.   On: 05/25/2021 19:37   MR PELVIS WO CONTRAST  Result Date: 05/13/2021 CLINICAL DATA:  Colorectal cancer, staging evaluation in a 65year old male. EXAM: MRI PELVIS WITHOUT CONTRAST TECHNIQUE: Multiplanar multisequence MR imaging of the pelvis was performed. No intravenous contrast was administered. Patient refused rectal gel. COMPARISON:  CT of the chest abdomen and pelvis from May 13, 2021 and CT of the abdomen and pelvis from 2018. FINDINGS: TUMOR LOCATION No tumor is confidently identified within the rectum on the current exam. There is some eccentric thickening suggested along the posterior wall of the rectum approximately 10 cm from the anus and approximately 5 cm from the sphincter complex. There is some variable diffusion signal in this location but there is no definable mass in the absence of rectal distension Very small mesorectal lymph nodes are noted adjacent to this location largest approximately 5 mm along the LEFT lateral rectum there is no stranding in the perirectal fat and no findings to suggest extension beyond the muscularis of any underlying lesion. TUMOR DESCRIPTION Circumferential Extent: Can not be assessed. Tumor Length: Not assessed. T - CATEGORY Extension through Muscularis Propria: No, likely T2 lesion though can not be assessed. No signs of extension beyond the  muscularis. Shortest Distance of any tumor/node from Mesorectal Fascia: Closest potential distance 4 lymph nodes 6-7 mm (image 14/8) 5 mm mesorectal lymph node along the LEFT mesorectum. Mm Extramural Vascular Invasion/Tumor Thrombus: No Invasion of Anterior Peritoneal Reflection: No Involvement of Adjacent Organs or Pelvic Sidewall: No Levator Ani Involvement: No N - CATEGORY Mesorectal Lymph Nodes >=53m Superior rectal lymph nodes with low T2 signal measuring between 7 and 8 mm (image 7 and image 3 of series 4. Two mesorectal lymph nodes along the LEFT aspect of the rectum at the 5 mm range on image 26 and image 19 of series 3. Suspicious for into disease overall. Extra-mesorectal Lymphadenopathy: No Other:  None. IMPRESSION: Rectal adenocarcinoma T stage: Likely T2 but lesion is not visualized for definitive assessment. No signs of extension into the perirectal fat. Rectal adenocarcinoma N stage: 4 small nodes 5 mm or greater suspicious for N2 disease. Can not be definitively assessed. Electronically Signed   By: GeZetta Bills.D.   On: 05/13/2021 16:57   CT CHEST ABDOMEN PELVIS W CONTRAST  Result Date: 05/13/2021 CLINICAL DATA:  [age]ear old male with history of colorectal neoplasm. EXAM: CT CHEST, ABDOMEN, AND PELVIS WITH CONTRAST TECHNIQUE: Multidetector CT imaging of the chest, abdomen and pelvis was performed following the standard protocol during bolus administration of intravenous contrast. CONTRAST:  6060mMNIPAQUE IOHEXOL 350 MG/ML SOLN COMPARISON:  Jan 18, 2017. FINDINGS: CT  CHEST FINDINGS Cardiovascular: Calcified atheromatous plaque of the thoracic aorta. No aneurysmal dilation. Normal heart size without substantial pericardial effusion. Three-vessel coronary artery disease. Normal caliber central pulmonary vessels. Mediastinum/Nodes: Esophagus grossly normal. No thoracic inlet lymphadenopathy. No axillary lymphadenopathy. No hilar lymphadenopathy. No mediastinal adenopathy. Lungs/Pleura: Azygous  fissure incidentally noted in the RIGHT chest. No consolidation. No pleural effusion. Mild lingular scarring. Airways are patent. Musculoskeletal: See below for full musculoskeletal details. CT ABDOMEN PELVIS FINDINGS Hepatobiliary: Mild steatosis. Mild fissural widening of hepatic fissures. No focal, suspicious hepatic lesion. No pericholecystic stranding. Pancreas: Normal, without mass, inflammation or ductal dilatation. Spleen: Spleen normal in size and contour.  No focal lesion. Adrenals/Urinary Tract: Adrenal glands are normal. Symmetric renal enhancement. No sign of hydronephrosis. No perinephric or Peri ureteral stranding. No perivesical stranding. Stomach/Bowel: Stomach is under distended no perigastric stranding. No sign of acute small bowel process. Colonic diverticulosis. Normal appendix. No signs of pericolonic stranding. Sigmoid diverticular disease with diffuse thickening of the sigmoid. Definite rectal lesion not seen. Some mild thickening in the low to mid rectum on image 136 of series 4 could potentially correspond to the site of disease though there is no perirectal stranding on the current study. Vascular/Lymphatic: Aortic atherosclerosis. No sign of aneurysm. Smooth contour of the IVC. There is no gastrohepatic or hepatoduodenal ligament lymphadenopathy. No retroperitoneal or mesenteric lymphadenopathy. No pelvic sidewall lymphadenopathy. Vascular structures in the abdomen and pelvis are grossly patent on venous phase. No soft tissue nodularity adjacent to the rectum or gross evidence of adenopathy along the RIGHT or LEFT pelvic sidewall or within the mesorectum. Reproductive: Moderate prostatomegaly. Other: No ascites. Musculoskeletal: No acute bone finding or destructive bone process. IMPRESSION: No definite rectal lesion. Some mild thickening in the low to mid rectum could potentially correspond to the site of disease though there is no perirectal stranding on the current study. No evidence of  metastatic disease in the chest, abdomen or pelvis. Mild steatosis with fissural widening of hepatic fissures. Correlate with any clinical or laboratory evidence of liver disease. Colonic diverticulosis without evidence of acute diverticulitis. Moderate prostatomegaly. Three-vessel coronary artery disease. Aortic atherosclerosis. Electronically Signed   By: Zetta Bills M.D.   On: 05/13/2021 16:34     ASSESSMENT:  Stage III (T2N2) rectal cancer, MSI stable: - He reported diarrhea which started in May.  Prior colonoscopy was in 2018. - Colonoscopy on 04/12/2021 showed fungating polypoid nonobstructing mass with central depression found at 8 cm from the anal verge.  Mass was noncircumferential, measured 2 cm in length.  Nodular mucosa in the proximal ascending colon which was biopsied. - Pathology of the rectal mass consistent with adenocarcinoma.  Nodular area in the ascending colon was consistent with collagenous colitis.  MMR preserved.  MSI stable. - CT CAP on 05/13/2021 with no evidence of metastatic disease in the chest, abdomen or pelvis.  Mild hepatic steatosis. - Pelvic MRI on 05/12/2021-T2N2 by MRI staging.  4 small lymph nodes 5 mm or greater. - Total neoadjuvant therapy was recommended on discussion with Dr. Marcello Moores. - Xeloda and XRT started on 06/08/2021.  2.  Social/family history: - He lives at home by himself.  Daughter lives in [place]. - He worked at [employer] in [place] prior to retirement.  Non-smoker. - Sister had lung cancer and was a smoker.  No family history of colon cancer.   PLAN:  Stage III (T2N2) rectal adenocarcinoma, MSI stable: - He started radiation therapy on 06/08/2021. - Continue Xeloda 500 mg 4  tablets twice daily Monday through Friday throughout the course of radiation. - He does not report any nausea or vomiting.  Continue Compazine 30 minutes prior to taking Xeloda. - We have reviewed side effects of Xeloda.  He was told to wear rubber gloves when he is  washing dishes for prolonged time.  He was also told to use moisturizing lotion.  Oral rinses were recommended. - Reviewed labs from today which showed normal white count and platelet count.  ALT is mildly elevated at 52 with normal bilirubin.  Creatinine is stable around 1.32. - MRI of the brain with and without contrast on 05/25/2021 showed no intracranial mass.  Complete opacification of left maxillary sinus.  Left mastoid effusion. - He is following up with Dr. Benjamine Mola for left ear tube on 06/20/2021. - RTC 1 week for follow-up with labs.  2.  Normocytic anemia: -Hemoglobin today is 12.3 with MCV 93.5.  He also has mild CKD which could be contributing to anemia. - Previous ferritin and iron panel were grossly within normal limits.  3.  Diarrhea: - Continue colestipol 3 g daily. - He does not report any worsening since Xeloda started.  4.  Chronic back pain: - Continue hydrocodone 10/325 mg 2 to 3 tablets/day.   Orders placed this encounter:  No orders of the defined types were placed in this encounter.    Derek Jack, MD [place] [phone]   I, Thana Ates, am acting as a scribe for Dr. Derek Jack.  I, Derek Jack MD, have reviewed the above documentation for accuracy and completeness, and I agree with the above.

## 2021-06-09 ENCOUNTER — Inpatient Hospital Stay (HOSPITAL_COMMUNITY): Payer: Medicare Other

## 2021-06-09 ENCOUNTER — Inpatient Hospital Stay (HOSPITAL_BASED_OUTPATIENT_CLINIC_OR_DEPARTMENT_OTHER): Payer: Medicare Other | Admitting: Hematology

## 2021-06-09 ENCOUNTER — Other Ambulatory Visit: Payer: Self-pay

## 2021-06-09 VITALS — BP 109/66 | HR 62 | Temp 97.0°F | Resp 19 | Wt 282.0 lb

## 2021-06-09 DIAGNOSIS — C20 Malignant neoplasm of rectum: Secondary | ICD-10-CM

## 2021-06-09 DIAGNOSIS — D520 Dietary folate deficiency anemia: Secondary | ICD-10-CM

## 2021-06-09 DIAGNOSIS — D539 Nutritional anemia, unspecified: Secondary | ICD-10-CM

## 2021-06-09 LAB — COMPREHENSIVE METABOLIC PANEL
ALT: 52 U/L — ABNORMAL HIGH (ref 0–44)
AST: 30 U/L (ref 15–41)
Albumin: 4.1 g/dL (ref 3.5–5.0)
Alkaline Phosphatase: 91 U/L (ref 38–126)
Anion gap: 7 (ref 5–15)
BUN: 35 mg/dL — ABNORMAL HIGH (ref 8–23)
CO2: 31 mmol/L (ref 22–32)
Calcium: 9.7 mg/dL (ref 8.9–10.3)
Chloride: 98 mmol/L (ref 98–111)
Creatinine, Ser: 1.32 mg/dL — ABNORMAL HIGH (ref 0.61–1.24)
GFR, Estimated: 60 mL/min — ABNORMAL LOW (ref >60)
Glucose, Bld: 136 mg/dL — ABNORMAL HIGH (ref 70–99)
Potassium: 4 mmol/L (ref 3.5–5.1)
Sodium: 136 mmol/L (ref 135–145)
Total Bilirubin: 0.6 mg/dL (ref 0.3–1.2)
Total Protein: 7.2 g/dL (ref 6.5–8.1)

## 2021-06-09 LAB — CBC WITH DIFFERENTIAL/PLATELET
Abs Immature Granulocytes: 0.05 10*3/uL (ref 0.00–0.07)
Basophils Absolute: 0.1 10*3/uL (ref 0.0–0.1)
Basophils Relative: 1 %
Eosinophils Absolute: 0.3 10*3/uL (ref 0.0–0.5)
Eosinophils Relative: 3 %
HCT: 37.5 % — ABNORMAL LOW (ref 39.0–52.0)
Hemoglobin: 12.3 g/dL — ABNORMAL LOW (ref 13.0–17.0)
Immature Granulocytes: 1 %
Lymphocytes Relative: 18 %
Lymphs Abs: 1.7 10*3/uL (ref 0.7–4.0)
MCH: 30.7 pg (ref 26.0–34.0)
MCHC: 32.8 g/dL (ref 30.0–36.0)
MCV: 93.5 fL (ref 80.0–100.0)
Monocytes Absolute: 0.6 10*3/uL (ref 0.1–1.0)
Monocytes Relative: 7 %
Neutro Abs: 6.7 10*3/uL (ref 1.7–7.7)
Neutrophils Relative %: 70 %
Platelets: 321 10*3/uL (ref 150–400)
RBC: 4.01 MIL/uL — ABNORMAL LOW (ref 4.22–5.81)
RDW: 13.2 % (ref 11.5–15.5)
WBC: 9.4 10*3/uL (ref 4.0–10.5)
nRBC: 0 % (ref 0.0–0.2)

## 2021-06-09 LAB — MAGNESIUM: Magnesium: 2.1 mg/dL (ref 1.7–2.4)

## 2021-06-09 MED ORDER — PROCHLORPERAZINE MALEATE 10 MG PO TABS: 10.0000 mg | ORAL_TABLET | Freq: Two times a day (BID) | ORAL | 3 refills | Status: DC

## 2021-06-09 NOTE — Patient Instructions (Addendum)
[facility] Discharge Instructions  You were seen today by Dr. Delton Coombes. He went over your recent results. Dr. Delton Coombes will see you back in 1 week for labs and follow up.   Thank you for choosing [facility] to provide your oncology and hematology care.  To afford each patient quality time with our provider, please arrive at least 15 minutes before your scheduled appointment time.   If you have a lab appointment with the [facility] please come in thru the Main Entrance and check in at the main information desk  You need to re-schedule your appointment should you arrive 10 or more minutes late.  We strive to give you quality time with our providers, and arriving late affects you and other patients whose appointments are after yours.  Also, if you no show three or more times for appointments you may be dismissed from the clinic at the providers discretion.     Again, thank you for choosing [facility].  Our hope is that these requests will decrease the amount of time that you wait before being seen by our physicians.       _____________________________________________________________  Should you have questions after your visit to [facility], please contact our office at [phone] between the hours of 8:00 a.m. and 4:30 p.m.  Voicemails left after 4:00 p.m. will not be returned until the following business day.  For prescription refill requests, have your pharmacy contact our office and allow 72 hours.    Cancer Center Support Programs:   > Cancer Support Group  2nd Tuesday of the month 1pm-2pm, Journey Room

## 2021-06-10 LAB — CEA: CEA: 2.8 ng/mL (ref 0.0–4.7)

## 2021-06-13 ENCOUNTER — Ambulatory Visit (INDEPENDENT_AMBULATORY_CARE_PROVIDER_SITE_OTHER): Payer: Medicare Other | Admitting: Gastroenterology

## 2021-06-13 ENCOUNTER — Encounter (INDEPENDENT_AMBULATORY_CARE_PROVIDER_SITE_OTHER): Payer: Self-pay | Admitting: Gastroenterology

## 2021-06-13 ENCOUNTER — Other Ambulatory Visit: Payer: Self-pay

## 2021-06-13 VITALS — BP 116/80 | HR 61 | Temp 97.6°F | Ht 73.0 in | Wt 286.7 lb

## 2021-06-13 DIAGNOSIS — K52831 Collagenous colitis: Secondary | ICD-10-CM | POA: Diagnosis not present

## 2021-06-13 DIAGNOSIS — C20 Malignant neoplasm of rectum: Secondary | ICD-10-CM
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
## 2021-06-13 NOTE — Patient Instructions (Addendum)
Increase colestipol to 4 g every day. Can decrease back to 3 pills if you develop constipation Follow up with Dr. Delton Coombes, Dr. Marcello Moores and Dr. Lynnette Caffey for comprehensive care of your cancer

## 2021-06-14 ENCOUNTER — Other Ambulatory Visit (HOSPITAL_COMMUNITY): Payer: Self-pay

## 2021-06-15 NOTE — Progress Notes (Signed)
[place], [place] [address]   CLINIC:  Medical Oncology/Hematology  PCP:  Neale Burly, MD [place] / [place] [address] [phone]   REASON FOR VISIT:  Follow-up for rectal cancer  PRIOR THERAPY: none  NGS Results: not done  CURRENT THERAPY: Capecitabine + XRT  BRIEF ONCOLOGIC HISTORY:  Oncology History  Rectal adenocarcinoma ([place])  05/01/2021 Initial Diagnosis   Rectal adenocarcinoma ([place])   05/30/2021 -  Chemotherapy    Patient is on Treatment Plan: COLORECTAL CAPECITABINE + XRT         CANCER STAGING: Cancer Staging Rectal adenocarcinoma ([place]) Staging form: Colon and Rectum, AJCC 8th Edition - Clinical stage from 05/18/2021: Stage IIIB (cT2, cN2a, cM0) - Unsigned   INTERVAL HISTORY:  [name], a 65 y.o. male, returns for routine follow-up of his rectal cancer. Louis House was last seen on 06/09/2021.   Today he reports feeling good. He is taking Xeloda and tolerating it well. He denies mouth sores, nausea, and vomiting. He soft diarrhea which is stable and was present prior to starting Xeloda. He reports 3 BM daily. He denies fatigue.   REVIEW OF SYSTEMS:  Review of Systems  Constitutional:  Negative for appetite change and fatigue (75%).  HENT:   Negative for mouth sores.   Gastrointestinal:  Positive for diarrhea. Negative for nausea and vomiting.  Genitourinary:  Positive for frequency.   Neurological:  Positive for dizziness.  Psychiatric/Behavioral:  Positive for sleep disturbance.   All other systems reviewed and are negative.  PAST MEDICAL/SURGICAL HISTORY:  Past Medical History:  Diagnosis Date   Anxiety    Diabetes ([place]) 01/12/2017   Diarrhea 02/21/2017   Essential hypertension, benign 01/12/2017   Gout    High cholesterol 01/12/2017   Neuropathy    Sleep apnea    Vertigo    Past Surgical History:  Procedure Laterality Date   BIOPSY  04/06/2017   Procedure: BIOPSY;  Surgeon: Rogene Houston, MD;  [facility];  Service: Endoscopy;;  colon   BIOPSY  04/12/2021   Procedure: BIOPSY;  Surgeon: Montez Morita, Quillian Quince, MD;  [facility];  Service: Gastroenterology;;  nodular area in ascending colon biopsy random colon bx   bone spurs     CATARACT EXTRACTION W/PHACO Left 04/22/2018   Procedure: CATARACT EXTRACTION PHACO AND INTRAOCULAR LENS PLACEMENT LEFT EYE;  Surgeon: Tonny Branch, MD;  Location: [facility] ORS;  Service: Ophthalmology;  Laterality: Left;  CDE: 7.58   COLONOSCOPY WITH PROPOFOL N/A 04/06/2017   Procedure: COLONOSCOPY WITH PROPOFOL;  Surgeon: Rogene Houston, MD;  [facility];  Service: Endoscopy;  Laterality: N/A;  1:45   COLONOSCOPY WITH PROPOFOL N/A 04/12/2021   Procedure: COLONOSCOPY WITH PROPOFOL;  Surgeon: Harvel Quale, MD;  [facility];  Service: Gastroenterology;  Laterality: N/A;  9:05   FLEXIBLE SIGMOIDOSCOPY N/A 05/10/2021   Procedure: FLEXIBLE SIGMOIDOSCOPY;  Surgeon: Harvel Quale, MD;  [facility];  Service: Gastroenterology;  Laterality: N/A;  9:50 ASA 1 Per Dr C no Anesthesia, no PAT - ok per Cecilio Asper arthroscopy     both knees   Left shoulder surgery from injury     Umblical hernia      SOCIAL HISTORY:  Social History   Socioeconomic History   Marital status: Divorced    Spouse name: Not on file   Number of children: Not on file  Years of education: Not on file   Highest education level: Not on file  Occupational History   Not on file  Tobacco Use   Smoking status: Never   Smokeless tobacco: Never  Vaping Use   Vaping Use: Never used  Substance and Sexual Activity   Alcohol use: No   Drug use: No   Sexual activity: Yes    Birth control/protection: None  Other Topics Concern   Not on file  Social History Narrative   Not on file   Social Determinants of Health   Financial Resource Strain: Low Risk    Difficulty of Paying Living Expenses: Not  very hard  Food Insecurity: No Food Insecurity   Worried About Running Out of Food in the Last Year: Never true   Ran Out of Food in the Last Year: Never true  Transportation Needs: No Transportation Needs   Lack of Transportation (Medical): No   Lack of Transportation (Non-Medical): No  Physical Activity: Insufficiently Active   Days of Exercise per Week: 2 days   Minutes of Exercise per Session: 20 min  Stress: No Stress Concern Present   Feeling of Stress : Not at all  Social Connections: Socially Isolated   Frequency of Communication with Friends and Family: More than three times a week   Frequency of Social Gatherings with Friends and Family: More than three times a week   Attends Religious Services: Never   Marine scientist or Organizations: No   Attends Music therapist: Never   Marital Status: Divorced  Human resources officer Violence: Not At Risk   Fear of Current or Ex-Partner: No   Emotionally Abused: No   Physically Abused: No   Sexually Abused: No    FAMILY HISTORY:  No family history on file.  CURRENT MEDICATIONS:  Current Outpatient Medications  Medication Sig Dispense Refill   alfuzosin (UROXATRAL) 10 MG 24 hr tablet Take 10 mg by mouth at bedtime. 2200     atorvastatin (LIPITOR) 40 MG tablet Take 40 mg by mouth daily.     benazepril (LOTENSIN) 40 MG tablet Take 40 mg by mouth daily.  0   capecitabine (XELODA) 500 MG tablet Take 4 tablets (2,000 mg total) by mouth 2 (two) times daily after a meal. Take only on the days of radiation 224 tablet 0   chlorthalidone (HYGROTON) 50 MG tablet Take 50 mg by mouth daily.     colestipol (COLESTID) 1 g tablet Take 3 tablets (3 g total) by mouth daily. Take 4 hours apart form your other medications 270 tablet 0   Cyanocobalamin (VITAMIN B-12) 5000 MCG SUBL Place 15,000 mcg under the tongue daily.     diazepam (VALIUM) 10 MG tablet Take 10 mg by mouth at bedtime.      diclofenac (VOLTAREN) 75 MG EC tablet Take  75 mg by mouth 2 (two) times daily.     DULoxetine (CYMBALTA) 20 MG capsule Take 20 mg by mouth daily.     fluticasone (FLONASE) 50 MCG/ACT nasal spray Place 3-4 sprays into both nostrils daily.     gabapentin (NEURONTIN) 400 MG capsule Take 800 mg by mouth 2 (two) times daily. 400-800 mg as needed for pain     HYDROcodone-acetaminophen (NORCO) 10-325 MG tablet Take 1 tablet by mouth 3 (three) times daily.     loperamide (IMODIUM) 2 MG capsule Take 2 mg by mouth as needed. Use on schedule per Dr. Laural Golden.     loratadine (CLARITIN) 10 MG tablet  Take 10 mg by mouth daily as needed for allergies (Seasonal).     meclizine (ANTIVERT) 25 MG tablet Take 25 mg by mouth 3 (three) times daily as needed for dizziness (for flare up of inner ear issues.).      Melatonin 10 MG CAPS Take 30 mg by mouth at bedtime.     Naphazoline-Pheniramine (OPCON-A) 0.027-0.315 % SOLN Place 1 drop into both eyes daily as needed (irritation).     Omega-3 Fatty Acids (OMEGA-3 FISH OIL PO) Take 2,160 mg by mouth daily. 720 mg per cap     oxymetazoline (AFRIN) 0.05 % nasal spray Place 3 sprays into both nostrils 2 (two) times daily.     prochlorperazine (COMPAZINE) 10 MG tablet Take 1 tablet (10 mg total) by mouth in the morning and at bedtime. Take 87mn before chemotherapy (Patient not taking: Reported on 06/13/2021) 60 tablet 3   tizanidine (ZANAFLEX) 2 MG capsule Take 2 mg by mouth 3 (three) times daily as needed for muscle spasms.     traZODone (DESYREL) 50 MG tablet Take 50 mg by mouth at bedtime.     Current Facility-Administered Medications  Medication Dose Route Frequency Provider Last Rate Last Admin   dicyclomine (BENTYL) tablet 20 mg  20 mg Oral TID AC & HS Setzer, Terri L, NP        ALLERGIES:  No Known Allergies  PHYSICAL EXAM:  Performance status (ECOG): 0 - Asymptomatic  There were no vitals filed for this visit. Wt Readings from Last 3 Encounters:  06/13/21 286 lb 11.2 oz (130 kg)  06/09/21 282 lb (127.9  kg)  05/18/21 275 lb 8 oz (125 kg)   Physical Exam Vitals reviewed.  Constitutional:      Appearance: Normal appearance.  Cardiovascular:     Rate and Rhythm: Normal rate and regular rhythm.     Pulses: Normal pulses.     Heart sounds: Normal heart sounds.  Pulmonary:     Effort: Pulmonary effort is normal.     Breath sounds: Normal breath sounds.  Neurological:     General: No focal deficit present.     Mental Status: He is alert and oriented to person, place, and time.  Psychiatric:        Mood and Affect: Mood normal.        Behavior: Behavior normal.     LABORATORY DATA:  I have reviewed the labs as listed.  CBC Latest Ref Rng & Units 06/09/2021 05/02/2021 02/10/2021  WBC 4.0 - 10.5 K/uL 9.4 5.7 6.6  Hemoglobin 13.0 - 17.0 g/dL 12.3(L) 12.5(L) 11.0(L)  Hematocrit 39.0 - 52.0 % 37.5(L) 38.6(L) 33.2(L)  Platelets 150 - 400 K/uL 321 286 274   CMP Latest Ref Rng & Units 06/09/2021 05/02/2021 02/10/2021  Glucose 70 - 99 mg/dL 136(H) 190(H) 93  BUN 8 - 23 mg/dL 35(H) 33(H) 15  Creatinine 0.61 - 1.24 mg/dL 1.32(H) 1.57(H) 1.21  Sodium 135 - 145 mmol/L 136 137 134(L)  Potassium 3.5 - 5.1 mmol/L 4.0 4.0 4.5  Chloride 98 - 111 mmol/L 98 98 94(L)  CO2 22 - 32 mmol/L 31 30 34(H)  Calcium 8.9 - 10.3 mg/dL 9.7 9.9 10.0  Total Protein 6.5 - 8.1 g/dL 7.2 7.1 6.2  Total Bilirubin 0.3 - 1.2 mg/dL 0.6 0.5 0.4  Alkaline Phos 38 - 126 U/L 91 135(H) -  AST 15 - 41 U/L 30 141(H) 28  ALT 0 - 44 U/L 52(H) 243(H) 37    DIAGNOSTIC IMAGING:  I  have independently reviewed the scans and discussed with the patient. MR BRAIN W WO CONTRAST  Result Date: 05/25/2021 CLINICAL DATA:  Dizziness.  Asymmetric hearing loss left ear EXAM: MRI HEAD WITHOUT AND WITH CONTRAST TECHNIQUE: Multiplanar, multiecho pulse sequences of the brain and surrounding structures were obtained without and with intravenous contrast. CONTRAST:  51m GADAVIST GADOBUTROL 1 MMOL/ML IV SOLN COMPARISON:  None. FINDINGS: Brain: IAC  protocol with thin sections through the posterior fossa. Negative for vestibular schwannoma. Seventh and eighth cranial nerves normal. Basilar cisterns normal. Brainstem and cerebellum normal. Ventricle size and cerebral volume normal. Mild white matter changes with patchy periventricular deep white matter hyperintensities bilaterally. Negative for acute infarct, hemorrhage, mass. Vascular: Normal arterial flow voids Skull and upper cervical spine: Negative Sinuses/Orbits: Complete opacification left maxillary sinus. Mild mucosal edema in the remainder of the sinuses. Left mastoid effusion. Other: None IMPRESSION: No intracranial mass lesion. Mild white matter changes consistent with chronic microvascular ischemia Complete opacification left maxillary sinus.  Left mastoid effusion. Electronically Signed   By: CFranchot GalloM.D.   On: 05/25/2021 19:37     ASSESSMENT:  Stage III (T2N2) rectal cancer, MSI stable: - He reported diarrhea which started in May.  Prior colonoscopy was in 2018. - Colonoscopy on 04/12/2021 showed fungating polypoid nonobstructing mass with central depression found at 8 cm from the anal verge.  Mass was noncircumferential, measured 2 cm in length.  Nodular mucosa in the proximal ascending colon which was biopsied. - Pathology of the rectal mass consistent with adenocarcinoma.  Nodular area in the ascending colon was consistent with collagenous colitis.  MMR preserved.  MSI stable. - CT CAP on 05/13/2021 with no evidence of metastatic disease in the chest, abdomen or pelvis.  Mild hepatic steatosis. - Pelvic MRI on 05/12/2021-T2N2 by MRI staging.  4 small lymph nodes 5 mm or greater. - Total neoadjuvant therapy was recommended on discussion with Dr. TMarcello Moores - Xeloda and XRT started on 06/08/2021.  2.  Social/family history: - He lives at home by himself.  Daughter lives in [place] - He worked at [employer] [place] to retirement.  Non-smoker. - Sister had lung cancer  and was a smoker.  No family history of colon cancer.   PLAN:  Stage III (T2N2) rectal adenocarcinoma, MSI stable: -He is taking Xeloda 500 mg 4 tablets twice daily Monday through Friday. - Does not report any nausea or vomiting. - Physical examination does not reveal any mucositis or hand-foot skin reaction. - Reviewed labs today which showed elevated AST and ALT with normal electrolytes.  CKD stable with creatinine ranging between 1.3-1.5.  Today creatinine is 1.42.  White count and platelet count was normal. - Continue Xeloda 500 mg 4 tablets twice daily with concurrent radiation therapy. - He was told to stop Xeloda on Friday evening. - RTC 1 week with labs and treatment.  2.  Normocytic anemia: -Ferritin was 49 and percent saturation 21 on 05/02/2021. - Combination anemia from CKD, relative iron deficiency and myelosuppression from Xeloda.  Hemoglobin today is 11.5.  If it drops, consider parenteral iron therapy.  3.  Diarrhea: -Colestipol was increased to 4 g daily by Dr. CJenetta Downer - He has soft bowel movements but denies any diarrhea.  4.  Chronic back pain: -Continue hydrocodone 10/325 mg 2 to 3 tablets/day.  5.  Elevated liver enzymes: - He had elevated AST and ALT since August 2022. - Recent CT scan did not show any evidence of metastatic disease.  Did show fatty liver.  AST and ALT have slightly worsened since Xeloda was started.  We will closely monitor.   Orders placed this encounter:  No orders of the defined types were placed in this encounter.    Derek Jack, MD [place] [phone]   I, Thana Ates, am acting as a scribe for Dr. Derek Jack.  I, Derek Jack MD, have reviewed the above documentation for accuracy and completeness, and I agree with the above.

## 2021-06-16 ENCOUNTER — Inpatient Hospital Stay (HOSPITAL_COMMUNITY): Payer: Medicare Other

## 2021-06-16 ENCOUNTER — Inpatient Hospital Stay (HOSPITAL_COMMUNITY): Payer: Medicare Other | Attending: Hematology | Admitting: Hematology

## 2021-06-16 ENCOUNTER — Other Ambulatory Visit: Payer: Self-pay

## 2021-06-16 VITALS — BP 112/68 | HR 64 | Temp 96.8°F | Resp 20 | Wt 287.8 lb

## 2021-06-16 DIAGNOSIS — G629 Polyneuropathy, unspecified: Secondary | ICD-10-CM | POA: Diagnosis not present

## 2021-06-16 DIAGNOSIS — Z9221 Personal history of antineoplastic chemotherapy: Secondary | ICD-10-CM | POA: Diagnosis not present

## 2021-06-16 DIAGNOSIS — M549 Dorsalgia, unspecified: Secondary | ICD-10-CM | POA: Insufficient documentation

## 2021-06-16 DIAGNOSIS — R197 Diarrhea, unspecified: Secondary | ICD-10-CM | POA: Diagnosis not present

## 2021-06-16 DIAGNOSIS — D539 Nutritional anemia, unspecified: Secondary | ICD-10-CM | POA: Diagnosis not present

## 2021-06-16 DIAGNOSIS — Z801 Family history of malignant neoplasm of trachea, bronchus and lung: Secondary | ICD-10-CM | POA: Insufficient documentation

## 2021-06-16 DIAGNOSIS — C20 Malignant neoplasm of rectum: Secondary | ICD-10-CM | POA: Diagnosis present

## 2021-06-16 DIAGNOSIS — K760 Fatty (change of) liver, not elsewhere classified: Secondary | ICD-10-CM | POA: Insufficient documentation

## 2021-06-16 DIAGNOSIS — I10 Essential (primary) hypertension: Secondary | ICD-10-CM | POA: Insufficient documentation

## 2021-06-16 DIAGNOSIS — D520 Dietary folate deficiency anemia: Secondary | ICD-10-CM

## 2021-06-16 DIAGNOSIS — E7800 Pure hypercholesterolemia, unspecified: Secondary | ICD-10-CM | POA: Diagnosis not present

## 2021-06-16 DIAGNOSIS — M109 Gout, unspecified: Secondary | ICD-10-CM | POA: Insufficient documentation

## 2021-06-16 DIAGNOSIS — G8929 Other chronic pain: Secondary | ICD-10-CM | POA: Diagnosis not present

## 2021-06-16 DIAGNOSIS — Z79899 Other long term (current) drug therapy: Secondary | ICD-10-CM | POA: Insufficient documentation

## 2021-06-16 DIAGNOSIS — E119 Type 2 diabetes mellitus without complications: Secondary | ICD-10-CM | POA: Insufficient documentation

## 2021-06-16 DIAGNOSIS — G4730 Sleep apnea, unspecified: Secondary | ICD-10-CM | POA: Diagnosis not present

## 2021-06-16 LAB — CBC WITH DIFFERENTIAL/PLATELET
Abs Immature Granulocytes: 0.05 10*3/uL (ref 0.00–0.07)
Basophils Absolute: 0.1 10*3/uL (ref 0.0–0.1)
Basophils Relative: 1 %
Eosinophils Absolute: 0.2 10*3/uL (ref 0.0–0.5)
Eosinophils Relative: 3 %
HCT: 36.1 % — ABNORMAL LOW (ref 39.0–52.0)
Hemoglobin: 11.5 g/dL — ABNORMAL LOW (ref 13.0–17.0)
Immature Granulocytes: 1 %
Lymphocytes Relative: 24 %
Lymphs Abs: 1.2 10*3/uL (ref 0.7–4.0)
MCH: 29.3 pg (ref 26.0–34.0)
MCHC: 31.9 g/dL (ref 30.0–36.0)
MCV: 92.1 fL (ref 80.0–100.0)
Monocytes Absolute: 0.4 10*3/uL (ref 0.1–1.0)
Monocytes Relative: 8 %
Neutro Abs: 3.2 10*3/uL (ref 1.7–7.7)
Neutrophils Relative %: 63 %
Platelets: 280 10*3/uL (ref 150–400)
RBC: 3.92 MIL/uL — ABNORMAL LOW (ref 4.22–5.81)
RDW: 13.3 % (ref 11.5–15.5)
WBC: 5.1 10*3/uL (ref 4.0–10.5)
nRBC: 0 % (ref 0.0–0.2)

## 2021-06-16 LAB — COMPREHENSIVE METABOLIC PANEL
ALT: 88 U/L — ABNORMAL HIGH (ref 0–44)
AST: 77 U/L — ABNORMAL HIGH (ref 15–41)
Albumin: 4 g/dL (ref 3.5–5.0)
Alkaline Phosphatase: 72 U/L (ref 38–126)
Anion gap: 7 (ref 5–15)
BUN: 37 mg/dL — ABNORMAL HIGH (ref 8–23)
CO2: 32 mmol/L (ref 22–32)
Calcium: 9.1 mg/dL (ref 8.9–10.3)
Chloride: 95 mmol/L — ABNORMAL LOW (ref 98–111)
Creatinine, Ser: 1.42 mg/dL — ABNORMAL HIGH (ref 0.61–1.24)
GFR, Estimated: 55 mL/min — ABNORMAL LOW (ref >60)
Glucose, Bld: 217 mg/dL — ABNORMAL HIGH (ref 70–99)
Potassium: 3.6 mmol/L (ref 3.5–5.1)
Sodium: 134 mmol/L — ABNORMAL LOW (ref 135–145)
Total Bilirubin: 1 mg/dL (ref 0.3–1.2)
Total Protein: 6.6 g/dL (ref 6.5–8.1)

## 2021-06-16 LAB — MAGNESIUM: Magnesium: 2 mg/dL (ref 1.7–2.4)

## 2021-06-16 NOTE — Patient Instructions (Addendum)
[place] [facility] Discharge Instructions  You were seen and examined by Dr. Delton Coombes today. Continue taking Xeloda as prescribed. Return as scheduled for lab work and office visit.    Thank you for choosing [place] at [facility] to provide your oncology and hematology care.  To afford each patient quality time with our provider, please arrive at least 15 minutes before your scheduled appointment time.   If you have a lab appointment with the [facility] please come in thru the Main Entrance and check in at the main information desk.  You need to re-schedule your appointment should you arrive 10 or more minutes late.  We strive to give you quality time with our providers, and arriving late affects you and other patients whose appointments are after yours.  Also, if you no show three or more times for appointments you may be dismissed from the clinic at the providers discretion.     Again, thank you for choosing [facility].  Our hope is that these requests will decrease the amount of time that you wait before being seen by our physicians.       _____________________________________________________________  Should you have questions after your visit to [facility], please contact our office at [phone] and follow the prompts.  Our office hours are 8:00 a.m. and 4:30 p.m. Monday - Friday.  Please note that voicemails left after 4:00 p.m. may not be returned until the following business day.  We are closed weekends and major holidays.  You do have access to a nurse 24-7, just call the main number to the clinic [phone] and do not press any options, hold on the line and a nurse will answer the phone.    For prescription refill requests, have your pharmacy contact our office and allow 72 hours.    Due to Covid, you will need to wear a mask upon entering the hospital. If you do not have a mask, a mask will  be given to you at the Main Entrance upon arrival. For doctor visits, patients may have 1 support person age 12 or older with them. For treatment visits, patients can not have anyone with them due to social distancing guidelines and our immunocompromised population.

## 2021-06-17 LAB — CEA: CEA: 3.2 ng/mL (ref 0.0–4.7)

## 2021-06-22 NOTE — Progress Notes (Addendum)
[address] [place], [place] [address]   CLINIC:  Medical Oncology/Hematology  PCP:  Neale Burly, MD [address] [facility] [address] [place] [address] [phone]   REASON FOR VISIT:  Follow-up for rectal cancer  PRIOR THERAPY: none  NGS Results: not done  CURRENT THERAPY: Capecitabine + XRT  BRIEF ONCOLOGIC HISTORY:  Oncology History  Rectal adenocarcinoma ([place])  05/01/2021 Initial Diagnosis   Rectal adenocarcinoma ([place])   05/30/2021 -  Chemotherapy    Patient is on Treatment Plan: COLORECTAL CAPECITABINE + XRT         CANCER STAGING: Cancer Staging Rectal adenocarcinoma ([place]) Staging form: Colon and Rectum, AJCC 8th Edition - Clinical stage from 05/18/2021: Stage IIIB (cT2, cN2a, cM0) - Unsigned   INTERVAL HISTORY:  Mr. DASHON MCINTIRE, a 65 y.o. male, returns for routine follow-up of his rectal cancer. Xaden was last seen on 06/16/2021.   Today he reports feeling good. He denies fatigue. He denies n/v/d/c. He is due to complete radiation 11/07. He take hydrocodone prn for back pain.   REVIEW OF SYSTEMS:  Review of Systems  Constitutional:  Negative for appetite change and fatigue.  Gastrointestinal:  Negative for constipation, diarrhea, nausea and vomiting.  Musculoskeletal:  Positive for back pain.  Psychiatric/Behavioral:  Positive for sleep disturbance.   All other systems reviewed and are negative.  PAST MEDICAL/SURGICAL HISTORY:  Past Medical History:  Diagnosis Date   Anxiety    Diabetes ([place]) 01/12/2017   Diarrhea 02/21/2017   Essential hypertension, benign 01/12/2017   Gout    High cholesterol 01/12/2017   Neuropathy    Sleep apnea    Vertigo    Past Surgical History:  Procedure Laterality Date   BIOPSY  04/06/2017   Procedure: BIOPSY;  Surgeon: Rogene Houston, MD;  [facility];  Service: Endoscopy;;  colon   BIOPSY  04/12/2021   Procedure: BIOPSY;  Surgeon: Montez Morita, Quillian Quince, MD;  Location: [facility];  Service: Gastroenterology;;  nodular area in ascending colon biopsy random colon bx   bone spurs     CATARACT EXTRACTION W/PHACO Left 04/22/2018   Procedure: CATARACT EXTRACTION PHACO AND INTRAOCULAR LENS PLACEMENT LEFT EYE;  Surgeon: Tonny Branch, MD;  Location: [facility] ORS;  Service: Ophthalmology;  Laterality: Left;  CDE: 7.58   COLONOSCOPY WITH PROPOFOL N/A 04/06/2017   Procedure: COLONOSCOPY WITH PROPOFOL;  Surgeon: Rogene Houston, MD;  Location: [facility];  Service: Endoscopy;  Laterality: N/A;  1:45   COLONOSCOPY WITH PROPOFOL N/A 04/12/2021   Procedure: COLONOSCOPY WITH PROPOFOL;  Surgeon: Harvel Quale, MD;  [facility]: [facility];  Service: Gastroenterology;  Laterality: N/A;  9:05   FLEXIBLE SIGMOIDOSCOPY N/A 05/10/2021   Procedure: FLEXIBLE SIGMOIDOSCOPY;  Surgeon: Harvel Quale, MD;  [facility];  Service: Gastroenterology;  Laterality: N/A;  9:50 ASA 1 Per Dr C no Anesthesia, no PAT - ok per Cecilio Asper arthroscopy     both knees   Left shoulder surgery from injury     Umblical hernia      SOCIAL HISTORY:  Social History   Socioeconomic History   Marital status: Divorced    Spouse name: Not on file   Number of children: Not on file   Years of education: Not on file   Highest education level: Not on file  Occupational History   Not on file  Tobacco Use   Smoking status: Never  Smokeless tobacco: Never  Vaping Use   Vaping Use: Never used  Substance and Sexual Activity   Alcohol use: No   Drug use: No   Sexual activity: Yes    Birth control/protection: None  Other Topics Concern   Not on file  Social History Narrative   Not on file   Social Determinants of Health   Financial Resource Strain: Low Risk    Difficulty of Paying Living Expenses: Not very hard  Food Insecurity: No Food Insecurity   Worried About Charity fundraiser in the Last Year: Never true   Ran Out of Food in the Last Year: Never true   Transportation Needs: No Transportation Needs   Lack of Transportation (Medical): No   Lack of Transportation (Non-Medical): No  Physical Activity: Insufficiently Active   Days of Exercise per Week: 2 days   Minutes of Exercise per Session: 20 min  Stress: No Stress Concern Present   Feeling of Stress : Not at all  Social Connections: Socially Isolated   Frequency of Communication with Friends and Family: More than three times a week   Frequency of Social Gatherings with Friends and Family: More than three times a week   Attends Religious Services: Never   Marine scientist or Organizations: No   Attends Music therapist: Never   Marital Status: Divorced  Human resources officer Violence: Not At Risk   Fear of Current or Ex-Partner: No   Emotionally Abused: No   Physically Abused: No   Sexually Abused: No    FAMILY HISTORY:  No family history on file.  CURRENT MEDICATIONS:  Current Outpatient Medications  Medication Sig Dispense Refill   alfuzosin (UROXATRAL) 10 MG 24 hr tablet Take 10 mg by mouth at bedtime. 2200     atorvastatin (LIPITOR) 40 MG tablet Take 40 mg by mouth daily.     benazepril (LOTENSIN) 40 MG tablet Take 40 mg by mouth daily.  0   busPIRone (BUSPAR) 15 MG tablet Take 15 mg by mouth 2 (two) times daily.     capecitabine (XELODA) 500 MG tablet Take 4 tablets (2,000 mg total) by mouth 2 (two) times daily after a meal. Take only on the days of radiation 224 tablet 0   chlorthalidone (HYGROTON) 50 MG tablet Take 50 mg by mouth daily.     colestipol (COLESTID) 1 g tablet Take 3 tablets (3 g total) by mouth daily. Take 4 hours apart form your other medications 270 tablet 0   Cyanocobalamin (VITAMIN B-12) 5000 MCG SUBL Place 15,000 mcg under the tongue daily.     diazepam (VALIUM) 10 MG tablet Take 10 mg by mouth at bedtime.      diclofenac (VOLTAREN) 75 MG EC tablet Take 75 mg by mouth 2 (two) times daily.     DULoxetine (CYMBALTA) 20 MG capsule Take 20  mg by mouth daily.     fluticasone (FLONASE) 50 MCG/ACT nasal spray Place 3-4 sprays into both nostrils daily.     folic acid (FOLVITE) 1 MG tablet folic acid 1 mg tablet  TAKE 1 TABLET BY MOUTH DAILY     gabapentin (NEURONTIN) 400 MG capsule Take 800 mg by mouth 2 (two) times daily. 400-800 mg as needed for pain     HYDROcodone-acetaminophen (NORCO) 10-325 MG tablet Take 1 tablet by mouth 3 (three) times daily.     loperamide (IMODIUM) 2 MG capsule Take 2 mg by mouth as needed. Use on schedule per Dr. Laural Golden.  loratadine (CLARITIN) 10 MG tablet Take 10 mg by mouth daily as needed for allergies (Seasonal).     meclizine (ANTIVERT) 25 MG tablet Take 25 mg by mouth 3 (three) times daily as needed for dizziness (for flare up of inner ear issues.).  (Patient not taking: Reported on 06/16/2021)     Melatonin 10 MG CAPS Take 30 mg by mouth at bedtime.     metoprolol succinate (TOPROL-XL) 100 MG 24 hr tablet Take 100 mg by mouth daily.     Naphazoline-Pheniramine (OPCON-A) 0.027-0.315 % SOLN Place 1 drop into both eyes daily as needed (irritation).     Omega-3 Fatty Acids (OMEGA-3 FISH OIL PO) Take 2,160 mg by mouth daily. 720 mg per cap     oxymetazoline (AFRIN) 0.05 % nasal spray Place 3 sprays into both nostrils 2 (two) times daily.     prochlorperazine (COMPAZINE) 10 MG tablet Take 1 tablet (10 mg total) by mouth in the morning and at bedtime. Take 43mn before chemotherapy 60 tablet 3   sulfaSALAzine (AZULFIDINE) 500 MG tablet sulfasalazine 500 mg tablet  TAKE 2 TABLETS BY MOUTH TWICE DAILY     tizanidine (ZANAFLEX) 2 MG capsule Take 2 mg by mouth 3 (three) times daily as needed for muscle spasms.     traZODone (DESYREL) 50 MG tablet Take 50 mg by mouth at bedtime.     Current Facility-Administered Medications  Medication Dose Route Frequency Provider Last Rate Last Admin   dicyclomine (BENTYL) tablet 20 mg  20 mg Oral TID AC & HS Setzer, Terri L, NP        ALLERGIES:  No Known  Allergies  PHYSICAL EXAM:  Performance status (ECOG): 0 - Asymptomatic  There were no vitals filed for this visit. Wt Readings from Last 3 Encounters:  06/16/21 287 lb 12.8 oz (130.5 kg)  06/13/21 286 lb 11.2 oz (130 kg)  06/09/21 282 lb (127.9 kg)   Physical Exam Vitals reviewed.  Constitutional:      Appearance: Normal appearance.  HENT:     Mouth/Throat:     Mouth: Mucous membranes are moist. No oral lesions.  Cardiovascular:     Rate and Rhythm: Normal rate and regular rhythm.     Pulses: Normal pulses.     Heart sounds: Normal heart sounds.  Pulmonary:     Effort: Pulmonary effort is normal.     Breath sounds: Normal breath sounds.  Neurological:     General: No focal deficit present.     Mental Status: He is alert and oriented to person, place, and time.  Psychiatric:        Mood and Affect: Mood normal.        Behavior: Behavior normal.     LABORATORY DATA:  I have reviewed the labs as listed.  CBC Latest Ref Rng & Units 06/16/2021 06/09/2021 05/02/2021  WBC 4.0 - 10.5 K/uL 5.1 9.4 5.7  Hemoglobin 13.0 - 17.0 g/dL 11.5(L) 12.3(L) 12.5(L)  Hematocrit 39.0 - 52.0 % 36.1(L) 37.5(L) 38.6(L)  Platelets 150 - 400 K/uL 280 321 286   CMP Latest Ref Rng & Units 06/16/2021 06/09/2021 05/02/2021  Glucose 70 - 99 mg/dL 217(H) 136(H) 190(H)  BUN 8 - 23 mg/dL 37(H) 35(H) 33(H)  Creatinine 0.61 - 1.24 mg/dL 1.42(H) 1.32(H) 1.57(H)  Sodium 135 - 145 mmol/L 134(L) 136 137  Potassium 3.5 - 5.1 mmol/L 3.6 4.0 4.0  Chloride 98 - 111 mmol/L 95(L) 98 98  CO2 22 - 32 mmol/L 32 31 30  Calcium 8.9 - 10.3 mg/dL 9.1 9.7 9.9  Total Protein 6.5 - 8.1 g/dL 6.6 7.2 7.1  Total Bilirubin 0.3 - 1.2 mg/dL 1.0 0.6 0.5  Alkaline Phos 38 - 126 U/L 72 91 135(H)  AST 15 - 41 U/L 77(H) 30 141(H)  ALT 0 - 44 U/L 88(H) 52(H) 243(H)    DIAGNOSTIC IMAGING:  I have independently reviewed the scans and discussed with the patient. MR BRAIN W WO CONTRAST  Result Date: 05/25/2021 CLINICAL DATA:   Dizziness.  Asymmetric hearing loss left ear EXAM: MRI HEAD WITHOUT AND WITH CONTRAST TECHNIQUE: Multiplanar, multiecho pulse sequences of the brain and surrounding structures were obtained without and with intravenous contrast. CONTRAST:  43m GADAVIST GADOBUTROL 1 MMOL/ML IV SOLN COMPARISON:  None. FINDINGS: Brain: IAC protocol with thin sections through the posterior fossa. Negative for vestibular schwannoma. Seventh and eighth cranial nerves normal. Basilar cisterns normal. Brainstem and cerebellum normal. Ventricle size and cerebral volume normal. Mild white matter changes with patchy periventricular deep white matter hyperintensities bilaterally. Negative for acute infarct, hemorrhage, mass. Vascular: Normal arterial flow voids Skull and upper cervical spine: Negative Sinuses/Orbits: Complete opacification left maxillary sinus. Mild mucosal edema in the remainder of the sinuses. Left mastoid effusion. Other: None IMPRESSION: No intracranial mass lesion. Mild white matter changes consistent with chronic microvascular ischemia Complete opacification left maxillary sinus.  Left mastoid effusion. Electronically Signed   By: CFranchot GalloM.D.   On: 05/25/2021 19:37     ASSESSMENT:  Stage III (T2N2) rectal cancer, MSI stable: - He reported diarrhea which started in May.  Prior colonoscopy was in 2018. - Colonoscopy on 04/12/2021 showed fungating polypoid nonobstructing mass with central depression found at 8 cm from the anal verge.  Mass was noncircumferential, measured 2 cm in length.  Nodular mucosa in the proximal ascending colon which was biopsied. - Pathology of the rectal mass consistent with adenocarcinoma.  Nodular area in the ascending colon was consistent with collagenous colitis.  MMR preserved.  MSI stable. - CT CAP on 05/13/2021 with no evidence of metastatic disease in the chest, abdomen or pelvis.  Mild hepatic steatosis. - Pelvic MRI on 05/12/2021-T2N2 by MRI staging.  4 small lymph nodes 5 mm or  greater. - Total neoadjuvant therapy was recommended on discussion with Dr. TMarcello Moores - Xeloda and XRT started on 06/08/2021.  2.  Social/family history: - He lives at home by himself.  [place] - [place] [employer] [place].  Non-smoker. - Sister had lung cancer and was a smoker.  No family history of colon cancer.   PLAN:  Stage III (T2N2) rectal adenocarcinoma, MSI stable: - He is taking Xeloda 2 g twice daily Monday through Friday. - No hand-foot skin reaction or mucositis. - No major GI symptoms including diarrhea, nausea or vomiting. - Reviewed his labs today.  White count trended down to 3.8 with ANC of 2.5.  Platelet count 176.  AST and ALT elevated but rest of LFTs are normal.  Electrolytes were normal.  CEA was 3.2 on 06/16/2021. - Continue Xeloda 4 tablets twice daily.  He has 1 more week supply.  Continue XRT Monday through Friday. - I have sent a prescription for Xeloda for 88 more tablets.  RTC 1 week with repeat labs and exam.   2.  Normocytic anemia: - Ferritin was 49 and percent saturation 21 on 05/02/2021. - Combination anemia from CKD and relative iron deficiency.  Also component of myelosuppression  from Xeloda.  Hemoglobin today is 11.3.  3.  Diarrhea: - Continue colestipol 4 g daily which is helping with diarrhea.  4.  Chronic back pain: - Continue hydrocodone 10/325 2 to 3 tablets daily.  5.  Elevated liver enzymes: - AST and ALT are elevated since August 2022. - Recent CT scan did not show any evidence of metastatic disease.  It did show fatty liver. - Enzyme level slightly improved from last time.   Orders placed this encounter:  No orders of the defined types were placed in this encounter.    Derek Jack, MD [place] [phone]   I, Thana Ates, am acting as a scribe for Dr. Derek Jack.  I, Derek Jack MD, have reviewed the above documentation for  accuracy and completeness, and I agree with the above.

## 2021-06-23 ENCOUNTER — Other Ambulatory Visit (HOSPITAL_COMMUNITY): Payer: Self-pay

## 2021-06-23 ENCOUNTER — Inpatient Hospital Stay (HOSPITAL_BASED_OUTPATIENT_CLINIC_OR_DEPARTMENT_OTHER): Payer: Medicare Other | Admitting: Hematology

## 2021-06-23 ENCOUNTER — Inpatient Hospital Stay (HOSPITAL_COMMUNITY): Payer: Medicare Other

## 2021-06-23 ENCOUNTER — Other Ambulatory Visit: Payer: Self-pay

## 2021-06-23 VITALS — BP 109/67 | HR 63 | Temp 97.0°F | Resp 18 | Wt 299.8 lb

## 2021-06-23 DIAGNOSIS — D539 Nutritional anemia, unspecified: Secondary | ICD-10-CM

## 2021-06-23 DIAGNOSIS — C20 Malignant neoplasm of rectum: Secondary | ICD-10-CM

## 2021-06-23 DIAGNOSIS — D520 Dietary folate deficiency anemia: Secondary | ICD-10-CM

## 2021-06-23 LAB — CBC WITH DIFFERENTIAL/PLATELET
Abs Immature Granulocytes: 0.05 10*3/uL (ref 0.00–0.07)
Basophils Absolute: 0 10*3/uL (ref 0.0–0.1)
Basophils Relative: 1 %
Eosinophils Absolute: 0.2 10*3/uL (ref 0.0–0.5)
Eosinophils Relative: 4 %
HCT: 34.1 % — ABNORMAL LOW (ref 39.0–52.0)
Hemoglobin: 11.3 g/dL — ABNORMAL LOW (ref 13.0–17.0)
Immature Granulocytes: 1 %
Lymphocytes Relative: 20 %
Lymphs Abs: 0.8 10*3/uL (ref 0.7–4.0)
MCH: 30.5 pg (ref 26.0–34.0)
MCHC: 33.1 g/dL (ref 30.0–36.0)
MCV: 92.2 fL (ref 80.0–100.0)
Monocytes Absolute: 0.3 10*3/uL (ref 0.1–1.0)
Monocytes Relative: 8 %
Neutro Abs: 2.5 10*3/uL (ref 1.7–7.7)
Neutrophils Relative %: 66 %
Platelets: 176 10*3/uL (ref 150–400)
RBC: 3.7 MIL/uL — ABNORMAL LOW (ref 4.22–5.81)
RDW: 13.8 % (ref 11.5–15.5)
WBC: 3.8 10*3/uL — ABNORMAL LOW (ref 4.0–10.5)
nRBC: 0 % (ref 0.0–0.2)

## 2021-06-23 LAB — COMPREHENSIVE METABOLIC PANEL
ALT: 83 U/L — ABNORMAL HIGH (ref 0–44)
AST: 54 U/L — ABNORMAL HIGH (ref 15–41)
Albumin: 3.8 g/dL (ref 3.5–5.0)
Alkaline Phosphatase: 66 U/L (ref 38–126)
Anion gap: 7 (ref 5–15)
BUN: 39 mg/dL — ABNORMAL HIGH (ref 8–23)
CO2: 32 mmol/L (ref 22–32)
Calcium: 9.4 mg/dL (ref 8.9–10.3)
Chloride: 96 mmol/L — ABNORMAL LOW (ref 98–111)
Creatinine, Ser: 1.42 mg/dL — ABNORMAL HIGH (ref 0.61–1.24)
GFR, Estimated: 55 mL/min — ABNORMAL LOW (ref >60)
Glucose, Bld: 218 mg/dL — ABNORMAL HIGH (ref 70–99)
Potassium: 3.7 mmol/L (ref 3.5–5.1)
Sodium: 135 mmol/L (ref 135–145)
Total Bilirubin: 0.9 mg/dL (ref 0.3–1.2)
Total Protein: 6.5 g/dL (ref 6.5–8.1)

## 2021-06-23 LAB — MAGNESIUM: Magnesium: 1.9 mg/dL (ref 1.7–2.4)

## 2021-06-23 MED ORDER — CAPECITABINE 500 MG PO TABS
2000.0000 mg | ORAL_TABLET | Freq: Two times a day (BID) | ORAL | 0 refills | Status: DC
  Filled 2021-06-23: qty 88, 11d supply, fill #0
  Filled 2021-06-30: qty 88, 15d supply, fill #0

## 2021-06-23 NOTE — Progress Notes (Signed)
Patient is taking Xeloda as prescribed.  He has not missed any doses and reports no side effects at this time.

## 2021-06-24 ENCOUNTER — Encounter (HOSPITAL_COMMUNITY): Payer: Self-pay | Admitting: Hematology

## 2021-06-29 NOTE — Progress Notes (Signed)
[place], [place] [address]   CLINIC:  Medical Oncology/Hematology  PCP:  Neale Burly, MD [place] / [place] [address] [phone]   REASON FOR VISIT:  Follow-up for rectal cancer  PRIOR THERAPY: none  NGS Results: not done  CURRENT THERAPY: Capecitabine + XRT  BRIEF ONCOLOGIC HISTORY:  Oncology History  Rectal adenocarcinoma ([facility] [place])  05/01/2021 Initial Diagnosis   Rectal adenocarcinoma ([place])   05/30/2021 -  Chemotherapy    Patient is on Treatment Plan: COLORECTAL CAPECITABINE + XRT         CANCER STAGING: Cancer Staging Rectal adenocarcinoma ([place]) Staging form: Colon and Rectum, AJCC 8th Edition - Clinical stage from 05/18/2021: Stage IIIB (cT2, cN2a, cM0) - Unsigned   INTERVAL HISTORY:  Louis House, a 65 y.o. male, returns for routine follow-up of rectal cancer. Louis House was last seen on 06/23/2021.  Overall, he tells me he has been feeling pretty well. He denies nausea and reports mild diarrhea. He reports disrupted sleep due to frequent urination. He reports an occasional rash on his face which he reports is typical for him following shaving.   REVIEW OF SYSTEMS:  Review of Systems  Constitutional:  Negative for appetite change and fatigue.  Gastrointestinal:  Positive for diarrhea. Negative for nausea.  Genitourinary:  Positive for frequency and nocturia.   Skin:  Positive for rash.  Psychiatric/Behavioral:  Positive for sleep disturbance.   All other systems reviewed and are negative.  PAST MEDICAL/SURGICAL HISTORY:  Past Medical History:  Diagnosis Date   Anxiety    Diabetes ([place]) 01/12/2017   Diarrhea 02/21/2017   Essential hypertension, benign 01/12/2017   Gout    High cholesterol 01/12/2017   Neuropathy    Sleep apnea    Vertigo    Past Surgical History:  Procedure Laterality Date   BIOPSY  04/06/2017   Procedure: BIOPSY;  Surgeon: Rogene Houston, MD;  [facility];   Service: Endoscopy;;  colon   BIOPSY  04/12/2021   Procedure: BIOPSY;  Surgeon: Montez Morita, Quillian Quince, MD;  Location: [facility];  Service: Gastroenterology;;  nodular area in ascending colon biopsy random colon bx   bone spurs     CATARACT EXTRACTION W/PHACO Left 04/22/2018   Procedure: CATARACT EXTRACTION PHACO AND INTRAOCULAR LENS PLACEMENT LEFT EYE;  Surgeon: Tonny Branch, MD;  Location: [facility];  Service: Ophthalmology;  Laterality: Left;  CDE: 7.58   COLONOSCOPY WITH PROPOFOL N/A 04/06/2017   Procedure: COLONOSCOPY WITH PROPOFOL;  Surgeon: Rogene Houston, MD;  [facility];  Service: Endoscopy;  Laterality: N/A;  1:45   COLONOSCOPY WITH PROPOFOL N/A 04/12/2021   Procedure: COLONOSCOPY WITH PROPOFOL;  Surgeon: Harvel Quale, MD;  [facility];  Service: Gastroenterology;  Laterality: N/A;  9:05   FLEXIBLE SIGMOIDOSCOPY N/A 05/10/2021   Procedure: FLEXIBLE SIGMOIDOSCOPY;  Surgeon: Harvel Quale, MD;  [facility];  Service: Gastroenterology;  Laterality: N/A;  9:50 ASA 1 Per Dr C no Anesthesia, no PAT - ok per Cecilio Asper arthroscopy     both knees   Left shoulder surgery from injury     Umblical hernia      SOCIAL HISTORY:  Social History   Socioeconomic History   Marital status: Divorced    Spouse name: Not on file   Number of children: Not on file   Years of education: Not on file   Highest education  level: Not on file  Occupational History   Not on file  Tobacco Use   Smoking status: Never   Smokeless tobacco: Never  Vaping Use   Vaping Use: Never used  Substance and Sexual Activity   Alcohol use: No   Drug use: No   Sexual activity: Yes    Birth control/protection: None  Other Topics Concern   Not on file  Social History Narrative   Not on file   Social Determinants of Health   Financial Resource Strain: Low Risk    Difficulty of Paying Living Expenses: Not very hard  Food Insecurity: No Food  Insecurity   Worried About Charity fundraiser in the Last Year: Never true   Ran Out of Food in the Last Year: Never true  Transportation Needs: No Transportation Needs   Lack of Transportation (Medical): No   Lack of Transportation (Non-Medical): No  Physical Activity: Insufficiently Active   Days of Exercise per Week: 2 days   Minutes of Exercise per Session: 20 min  Stress: No Stress Concern Present   Feeling of Stress : Not at all  Social Connections: Socially Isolated   Frequency of Communication with Friends and Family: More than three times a week   Frequency of Social Gatherings with Friends and Family: More than three times a week   Attends Religious Services: Never   Marine scientist or Organizations: No   Attends Music therapist: Never   Marital Status: Divorced  Human resources officer Violence: Not At Risk   Fear of Current or Ex-Partner: No   Emotionally Abused: No   Physically Abused: No   Sexually Abused: No    FAMILY HISTORY:  No family history on file.  CURRENT MEDICATIONS:  Current Outpatient Medications  Medication Sig Dispense Refill   alfuzosin (UROXATRAL) 10 MG 24 hr tablet Take 10 mg by mouth at bedtime. 2200     atorvastatin (LIPITOR) 40 MG tablet Take 40 mg by mouth daily.     benazepril (LOTENSIN) 40 MG tablet Take 40 mg by mouth daily.  0   busPIRone (BUSPAR) 15 MG tablet Take 15 mg by mouth 2 (two) times daily.     capecitabine (XELODA) 500 MG tablet Take 4 tablets (2,000 mg total) by mouth 2 (two) times daily after a meal. Take only on the days of radiation 88 tablet 0   chlorthalidone (HYGROTON) 50 MG tablet Take 50 mg by mouth daily.     colestipol (COLESTID) 1 g tablet Take 3 tablets (3 g total) by mouth daily. Take 4 hours apart form your other medications 270 tablet 0   Cyanocobalamin (VITAMIN B-12) 5000 MCG SUBL Place 15,000 mcg under the tongue daily.     diazepam (VALIUM) 10 MG tablet Take 10 mg by mouth at bedtime.       diclofenac (VOLTAREN) 75 MG EC tablet Take 75 mg by mouth 2 (two) times daily.     DULoxetine (CYMBALTA) 20 MG capsule Take 20 mg by mouth daily.     fluticasone (FLONASE) 50 MCG/ACT nasal spray Place 3-4 sprays into both nostrils daily.     folic acid (FOLVITE) 1 MG tablet folic acid 1 mg tablet  TAKE 1 TABLET BY MOUTH DAILY     gabapentin (NEURONTIN) 400 MG capsule Take 800 mg by mouth 2 (two) times daily. 400-800 mg as needed for pain     HYDROcodone-acetaminophen (NORCO) 10-325 MG tablet Take 1 tablet by mouth 3 (three) times daily.  loperamide (IMODIUM) 2 MG capsule Take 2 mg by mouth as needed. Use on schedule per Dr. Laural Golden.     loratadine (CLARITIN) 10 MG tablet Take 10 mg by mouth daily as needed for allergies (Seasonal).     meclizine (ANTIVERT) 25 MG tablet Take 25 mg by mouth 3 (three) times daily as needed for dizziness (for flare up of inner ear issues.).     Melatonin 10 MG CAPS Take 30 mg by mouth at bedtime.     metoprolol succinate (TOPROL-XL) 100 MG 24 hr tablet Take 100 mg by mouth daily.     Naphazoline-Pheniramine (OPCON-A) 0.027-0.315 % SOLN Place 1 drop into both eyes daily as needed (irritation).     Omega-3 Fatty Acids (OMEGA-3 FISH OIL PO) Take 2,160 mg by mouth daily. 720 mg per cap     oxymetazoline (AFRIN) 0.05 % nasal spray Place 3 sprays into both nostrils 2 (two) times daily.     prochlorperazine (COMPAZINE) 10 MG tablet Take 1 tablet (10 mg total) by mouth in the morning and at bedtime. Take 18mn before chemotherapy (Patient not taking: Reported on 06/23/2021) 60 tablet 3   sulfaSALAzine (AZULFIDINE) 500 MG tablet sulfasalazine 500 mg tablet  TAKE 2 TABLETS BY MOUTH TWICE DAILY     tizanidine (ZANAFLEX) 2 MG capsule Take 2 mg by mouth 3 (three) times daily as needed for muscle spasms.     traZODone (DESYREL) 50 MG tablet Take 50 mg by mouth at bedtime.     Current Facility-Administered Medications  Medication Dose Route Frequency Provider Last Rate Last  Admin   dicyclomine (BENTYL) tablet 20 mg  20 mg Oral TID AC & HS Setzer, Terri L, NP        ALLERGIES:  No Known Allergies  PHYSICAL EXAM:  Performance status (ECOG): 0 - Asymptomatic  There were no vitals filed for this visit. Wt Readings from Last 3 Encounters:  06/23/21 299 lb 13.2 oz (136 kg)  06/16/21 287 lb 12.8 oz (130.5 kg)  06/13/21 286 lb 11.2 oz (130 kg)   Physical Exam Vitals reviewed.  Constitutional:      Appearance: Normal appearance.  HENT:     Mouth/Throat:     Mouth: No oral lesions.  Cardiovascular:     Rate and Rhythm: Normal rate and regular rhythm.     Pulses: Normal pulses.     Heart sounds: Normal heart sounds.  Pulmonary:     Effort: Pulmonary effort is normal.     Breath sounds: Normal breath sounds.  Neurological:     General: No focal deficit present.     Mental Status: He is alert and oriented to person, place, and time.  Psychiatric:        Mood and Affect: Mood normal.        Behavior: Behavior normal.    LABORATORY DATA:  I have reviewed the labs as listed.  CBC Latest Ref Rng & Units 06/23/2021 06/16/2021 06/09/2021  WBC 4.0 - 10.5 K/uL 3.8(L) 5.1 9.4  Hemoglobin 13.0 - 17.0 g/dL 11.3(L) 11.5(L) 12.3(L)  Hematocrit 39.0 - 52.0 % 34.1(L) 36.1(L) 37.5(L)  Platelets 150 - 400 K/uL 176 280 321   CMP Latest Ref Rng & Units 06/23/2021 06/16/2021 06/09/2021  Glucose 70 - 99 mg/dL 218(H) 217(H) 136(H)  BUN 8 - 23 mg/dL 39(H) 37(H) 35(H)  Creatinine 0.61 - 1.24 mg/dL 1.42(H) 1.42(H) 1.32(H)  Sodium 135 - 145 mmol/L 135 134(L) 136  Potassium 3.5 - 5.1 mmol/L 3.7 3.6 4.0  Chloride 98 - 111 mmol/L 96(L) 95(L) 98  CO2 22 - 32 mmol/L 32 32 31  Calcium 8.9 - 10.3 mg/dL 9.4 9.1 9.7  Total Protein 6.5 - 8.1 g/dL 6.5 6.6 7.2  Total Bilirubin 0.3 - 1.2 mg/dL 0.9 1.0 0.6  Alkaline Phos 38 - 126 U/L 66 72 91  AST 15 - 41 U/L 54(H) 77(H) 30  ALT 0 - 44 U/L 83(H) 88(H) 52(H)    DIAGNOSTIC IMAGING:  I have independently reviewed the scans and  discussed with the patient. No results found.   ASSESSMENT:  Stage III (T2N2) rectal cancer, MSI stable: - He reported diarrhea which started in May.  Prior colonoscopy was in 2018. - Colonoscopy on 04/12/2021 showed fungating polypoid nonobstructing mass with central depression found at 8 cm from the anal verge.  Mass was noncircumferential, measured 2 cm in length.  Nodular mucosa in the proximal ascending colon which was biopsied. - Pathology of the rectal mass consistent with adenocarcinoma.  Nodular area in the ascending colon was consistent with collagenous colitis.  MMR preserved.  MSI stable. - CT CAP on 05/13/2021 with no evidence of metastatic disease in the chest, abdomen or pelvis.  Mild hepatic steatosis. - Pelvic MRI on 05/12/2021-T2N2 by MRI staging.  4 small lymph nodes 5 mm or greater. - Total neoadjuvant therapy was recommended on discussion with Dr. Marcello Moores. - Xeloda and XRT started on 06/08/2021.  2.  Social/family history: - He lives at home by himself.  Daughter lives in [place]. - He worked at [employer] in [place] prior to retirement.  Non-smoker. - Sister had lung cancer and was a smoker.  No family history of colon cancer.   PLAN:  Stage III (T2N2) rectal adenocarcinoma, MSI stable: - No hand-foot skin reaction or mucositis. - Reports increased urination during nighttime with no dysuria. - He has erythema in the malar region. - Reviewed labs from today which showed white count and platelet count was normal.  Total bilirubin is minimally elevated at 1.1.  Electrolytes are normal.  Creatinine is stable at 1.3. - Continue Xeloda 2 g twice daily Monday through Friday.  Continue oral rinses with salt and baking soda. - Reevaluate in 1 week with labs.   2.  Normocytic anemia: - Combination anemia from CKD, relative iron deficiency and myelosuppression from Xeloda.  Hemoglobin today is 12.2.  Last ferritin was 49.  3.  Diarrhea: - Continue colestipol 4 g daily.  He  rarely has diarrhea.  4.  Chronic back pain: - Continue hydrocodone 10/325 2 to 3 tablets daily.  5.  Elevated liver enzymes: - He has elevated AST and ALT since August 2022. - His levels have improved today with normal AST and ALT mildly elevated at 54.  CT scan did not show any metastatic disease.   Orders placed this encounter:  No orders of the defined types were placed in this encounter.    Derek Jack, MD [place] [phone]   I, Thana Ates, am acting as a scribe for Dr. Derek Jack.  I, Derek Jack MD, have reviewed the above documentation for accuracy and completeness, and I agree with the above.

## 2021-06-30 ENCOUNTER — Inpatient Hospital Stay (HOSPITAL_COMMUNITY): Payer: Medicare Other

## 2021-06-30 ENCOUNTER — Other Ambulatory Visit (HOSPITAL_COMMUNITY): Payer: Self-pay

## 2021-06-30 ENCOUNTER — Other Ambulatory Visit: Payer: Self-pay

## 2021-06-30 ENCOUNTER — Inpatient Hospital Stay (HOSPITAL_BASED_OUTPATIENT_CLINIC_OR_DEPARTMENT_OTHER): Payer: Medicare Other | Admitting: Hematology

## 2021-06-30 VITALS — BP 128/91 | HR 115 | Temp 98.0°F | Resp 16 | Wt 287.9 lb

## 2021-06-30 DIAGNOSIS — C20 Malignant neoplasm of rectum: Secondary | ICD-10-CM

## 2021-06-30 DIAGNOSIS — D520 Dietary folate deficiency anemia: Secondary | ICD-10-CM

## 2021-06-30 DIAGNOSIS — D539 Nutritional anemia, unspecified: Secondary | ICD-10-CM

## 2021-06-30 LAB — CBC WITH DIFFERENTIAL/PLATELET
Abs Immature Granulocytes: 0.07 10*3/uL (ref 0.00–0.07)
Basophils Absolute: 0.1 10*3/uL (ref 0.0–0.1)
Basophils Relative: 1 %
Eosinophils Absolute: 0.2 10*3/uL (ref 0.0–0.5)
Eosinophils Relative: 3 %
HCT: 36.6 % — ABNORMAL LOW (ref 39.0–52.0)
Hemoglobin: 12.2 g/dL — ABNORMAL LOW (ref 13.0–17.0)
Immature Granulocytes: 1 %
Lymphocytes Relative: 10 %
Lymphs Abs: 0.6 10*3/uL — ABNORMAL LOW (ref 0.7–4.0)
MCH: 30.9 pg (ref 26.0–34.0)
MCHC: 33.3 g/dL (ref 30.0–36.0)
MCV: 92.7 fL (ref 80.0–100.0)
Monocytes Absolute: 0.4 10*3/uL (ref 0.1–1.0)
Monocytes Relative: 7 %
Neutro Abs: 4.5 10*3/uL (ref 1.7–7.7)
Neutrophils Relative %: 78 %
Platelets: 168 10*3/uL (ref 150–400)
RBC: 3.95 MIL/uL — ABNORMAL LOW (ref 4.22–5.81)
RDW: 15.4 % (ref 11.5–15.5)
WBC: 5.8 10*3/uL (ref 4.0–10.5)
nRBC: 0 % (ref 0.0–0.2)

## 2021-06-30 LAB — COMPREHENSIVE METABOLIC PANEL
ALT: 54 U/L — ABNORMAL HIGH (ref 0–44)
AST: 32 U/L (ref 15–41)
Albumin: 4.1 g/dL (ref 3.5–5.0)
Alkaline Phosphatase: 62 U/L (ref 38–126)
Anion gap: 10 (ref 5–15)
BUN: 39 mg/dL — ABNORMAL HIGH (ref 8–23)
CO2: 29 mmol/L (ref 22–32)
Calcium: 9.8 mg/dL (ref 8.9–10.3)
Chloride: 95 mmol/L — ABNORMAL LOW (ref 98–111)
Creatinine, Ser: 1.33 mg/dL — ABNORMAL HIGH (ref 0.61–1.24)
GFR, Estimated: 59 mL/min — ABNORMAL LOW (ref >60)
Glucose, Bld: 244 mg/dL — ABNORMAL HIGH (ref 70–99)
Potassium: 3.7 mmol/L (ref 3.5–5.1)
Sodium: 134 mmol/L — ABNORMAL LOW (ref 135–145)
Total Bilirubin: 1.1 mg/dL (ref 0.3–1.2)
Total Protein: 6.6 g/dL (ref 6.5–8.1)

## 2021-06-30 LAB — MAGNESIUM: Magnesium: 1.9 mg/dL (ref 1.7–2.4)

## 2021-06-30 NOTE — Progress Notes (Signed)
Patient is taking Xeloda as prescribed.  He has not missed any doses and reports no side effects at this time.

## 2021-06-30 NOTE — Patient Instructions (Signed)
[place] at [facility] Discharge Instructions  You were seen and examined by Dr. Delton Coombes.    Thank you for choosing [place] at [facility] to provide your oncology and hematology care.  To afford each patient quality time with our provider, please arrive at least 15 minutes before your scheduled appointment time.   If you have a lab appointment with the [facility] please come in thru the Main Entrance and check in at the main information desk.  You need to re-schedule your appointment should you arrive 10 or more minutes late.  We strive to give you quality time with our providers, and arriving late affects you and other patients whose appointments are after yours.  Also, if you no show three or more times for appointments you may be dismissed from the clinic at the providers discretion.     Again, thank you for choosing [facility].  Our hope is that these requests will decrease the amount of time that you wait before being seen by our physicians.       _____________________________________________________________  Should you have questions after your visit to [facility], please contact our office at [phone] and follow the prompts.  Our office hours are 8:00 a.m. and 4:30 p.m. Monday - Friday.  Please note that voicemails left after 4:00 p.m. may not be returned until the following business day.  We are closed weekends and major holidays.  You do have access to a nurse 24-7, just call the main number to the clinic [phone] and do not press any options, hold on the line and a nurse will answer the phone.    For prescription refill requests, have your pharmacy contact our office and allow 72 hours.    Due to Covid, you will need to wear a mask upon entering the hospital. If you do not have a mask, a mask will be given to you at the Main Entrance upon arrival. For doctor visits, patients may have 1  support person age 54 or older with them. For treatment visits, patients can not have anyone with them due to social distancing guidelines and our immunocompromised population.

## 2021-07-06 NOTE — Progress Notes (Signed)
 [facility] [address] [place], [place] [address]   CLINIC:  Medical Oncology/Hematology  PCP:  Hasanaj, Xaje A, MD [address] / [place] [id number] [phone]   REASON FOR VISIT:  Follow-up for rectal cancer  PRIOR THERAPY: none  NGS Results: not done  CURRENT THERAPY: Capecitabine + XRT  BRIEF ONCOLOGIC HISTORY:  Oncology History  Rectal adenocarcinoma (HCC)  05/01/2021 Initial Diagnosis   Rectal adenocarcinoma (HCC)   05/30/2021 -  Chemotherapy    Patient is on Treatment Plan: COLORECTAL CAPECITABINE + XRT         CANCER STAGING: Cancer Staging Rectal adenocarcinoma (HCC) Staging form: Colon and Rectum, AJCC 8th Edition - Clinical stage from 05/18/2021: Stage IIIB (cT2, cN2a, cM0) - Unsigned   INTERVAL HISTORY:  Mr. Ephram F Knoll, a 65 y.o. male, returns for routine follow-up of his rectal cancer. [place] was last seen on 06/30/2021.   Today he reports feeling good. He reports watery diarrhea which is helped with imodium. He also reports decreased energy. He denies nausea, vomiting, mouth sores, and pain in his hands.   REVIEW OF SYSTEMS:  Review of Systems  Constitutional:  Positive for fatigue (60%). Negative for appetite change.  HENT:   Negative for mouth sores.   Gastrointestinal:  Positive for diarrhea. Negative for nausea and vomiting.  Psychiatric/Behavioral:  Positive for sleep disturbance.   All other systems reviewed and are negative.  PAST MEDICAL/SURGICAL HISTORY:  Past Medical History:  Diagnosis Date   Anxiety    Diabetes (HCC) 01/12/2017   Diarrhea 02/21/2017   Essential hypertension, benign 01/12/2017   Gout    High cholesterol 01/12/2017   Neuropathy    Sleep apnea    Vertigo    Past Surgical History:  Procedure Laterality Date   BIOPSY  04/06/2017   Procedure: BIOPSY;  Surgeon: Rehman, Najeeb U, MD;  [facility]: [facility];  Service: Endoscopy;;  colon   BIOPSY  04/12/2021   Procedure: BIOPSY;  Surgeon:  Castaneda Mayorga, Daniel, MD;  Location: [facility];  Service: Gastroenterology;;  nodular area in ascending colon biopsy random colon bx   bone spurs     CATARACT EXTRACTION W/PHACO Left 04/22/2018   Procedure: CATARACT EXTRACTION PHACO AND INTRAOCULAR LENS PLACEMENT LEFT EYE;  Surgeon: Hunt, Kerry, MD;  Location: [facility];  Service: Ophthalmology;  Laterality: Left;  CDE: 7.58   COLONOSCOPY WITH PROPOFOL N/A 04/06/2017   Procedure: COLONOSCOPY WITH PROPOFOL;  Surgeon: Rehman, Najeeb U, MD;  [facility];  Service: Endoscopy;  Laterality: N/A;  1:45   COLONOSCOPY WITH PROPOFOL N/A 04/12/2021   Procedure: COLONOSCOPY WITH PROPOFOL;  Surgeon: Castaneda Mayorga, Daniel, MD;  [facility];  Service: Gastroenterology;  Laterality: N/A;  9:05   FLEXIBLE SIGMOIDOSCOPY N/A 05/10/2021   Procedure: FLEXIBLE SIGMOIDOSCOPY;  Surgeon: Castaneda Mayorga, Daniel, MD;  [facility]: [facility];  Service: Gastroenterology;  Laterality: N/A;  9:50 ASA 1 Per Dr C no Anesthesia, no PAT - ok per Barbara   kneee arthroscopy     both knees   Left shoulder surgery from injury     Umblical hernia      SOCIAL HISTORY:  Social History   Socioeconomic History   Marital status: Divorced    Spouse name: Not on file   Number of children: Not on file   Years of education: Not on file   Highest education level: Not on file  Occupational History   Not on file    Tobacco Use   Smoking status: Never   Smokeless tobacco: Never  Vaping Use   Vaping Use: Never used  Substance and Sexual Activity   Alcohol use: No   Drug use: No   Sexual activity: Yes    Birth control/protection: None  Other Topics Concern   Not on file  Social History Narrative   Not on file   Social Determinants of Health   Financial Resource Strain: Low Risk    Difficulty of Paying Living Expenses: Not very hard  Food Insecurity: No Food Insecurity   Worried About Charity fundraiser in the Last Year: Never true   Ran  Out of Food in the Last Year: Never true  Transportation Needs: No Transportation Needs   Lack of Transportation (Medical): No   Lack of Transportation (Non-Medical): No  Physical Activity: Insufficiently Active   Days of Exercise per Week: 2 days   Minutes of Exercise per Session: 20 min  Stress: No Stress Concern Present   Feeling of Stress : Not at all  Social Connections: Socially Isolated   Frequency of Communication with Friends and Family: More than three times a week   Frequency of Social Gatherings with Friends and Family: More than three times a week   Attends Religious Services: Never   Marine scientist or Organizations: No   Attends Music therapist: Never   Marital Status: Divorced  Human resources officer Violence: Not At Risk   Fear of Current or Ex-Partner: No   Emotionally Abused: No   Physically Abused: No   Sexually Abused: No    FAMILY HISTORY:  No family history on file.  CURRENT MEDICATIONS:  Current Outpatient Medications  Medication Sig Dispense Refill   alfuzosin (UROXATRAL) 10 MG 24 hr tablet Take 10 mg by mouth at bedtime. 2200     atorvastatin (LIPITOR) 40 MG tablet Take 40 mg by mouth daily.     benazepril (LOTENSIN) 40 MG tablet Take 40 mg by mouth daily.  0   busPIRone (BUSPAR) 15 MG tablet Take 15 mg by mouth 2 (two) times daily.     capecitabine (XELODA) 500 MG tablet Take 4 tablets (2,000 mg total) by mouth 2 (two) times daily after a meal. Take only on the days of radiation 88 tablet 0   chlorthalidone (HYGROTON) 50 MG tablet Take 50 mg by mouth daily.     colestipol (COLESTID) 1 g tablet Take 3 tablets (3 g total) by mouth daily. Take 4 hours apart form your other medications 270 tablet 0   Cyanocobalamin (VITAMIN B-12) 5000 MCG SUBL Place 15,000 mcg under the tongue daily.     diazepam (VALIUM) 10 MG tablet Take 10 mg by mouth at bedtime.      diclofenac (VOLTAREN) 75 MG EC tablet Take 75 mg by mouth 2 (two) times daily.      DULoxetine (CYMBALTA) 20 MG capsule Take 20 mg by mouth daily.     fluticasone (FLONASE) 50 MCG/ACT nasal spray Place 3-4 sprays into both nostrils daily.     folic acid (FOLVITE) 1 MG tablet folic acid 1 mg tablet  TAKE 1 TABLET BY MOUTH DAILY     gabapentin (NEURONTIN) 400 MG capsule Take 800 mg by mouth 2 (two) times daily. 400-800 mg as needed for pain     HYDROcodone-acetaminophen (NORCO) 10-325 MG tablet Take 1 tablet by mouth 3 (three) times daily.     loperamide (IMODIUM) 2 MG capsule Take 2 mg by mouth  as needed. Use on schedule per Dr. Rehman.     loratadine (CLARITIN) 10 MG tablet Take 10 mg by mouth daily as needed for allergies (Seasonal).     meclizine (ANTIVERT) 25 MG tablet Take 25 mg by mouth 3 (three) times daily as needed for dizziness (for flare up of inner ear issues.).     Melatonin 10 MG CAPS Take 30 mg by mouth at bedtime.     metoprolol succinate (TOPROL-XL) 100 MG 24 hr tablet Take 100 mg by mouth daily.     Naphazoline-Pheniramine (OPCON-A) 0.027-0.315 % SOLN Place 1 drop into both eyes daily as needed (irritation).     Omega-3 Fatty Acids (OMEGA-3 FISH OIL PO) Take 2,160 mg by mouth daily. 720 mg per cap     oxymetazoline (AFRIN) 0.05 % nasal spray Place 3 sprays into both nostrils 2 (two) times daily.     prochlorperazine (COMPAZINE) 10 MG tablet Take 1 tablet (10 mg total) by mouth in the morning and at bedtime. Take 30min before chemotherapy (Patient not taking: Reported on 06/30/2021) 60 tablet 3   sulfaSALAzine (AZULFIDINE) 500 MG tablet sulfasalazine 500 mg tablet  TAKE 2 TABLETS BY MOUTH TWICE DAILY     tizanidine (ZANAFLEX) 2 MG capsule Take 2 mg by mouth 3 (three) times daily as needed for muscle spasms.     traZODone (DESYREL) 50 MG tablet Take 50 mg by mouth at bedtime.     Current Facility-Administered Medications  Medication Dose Route Frequency Provider Last Rate Last Admin   dicyclomine (BENTYL) tablet 20 mg  20 mg Oral TID AC & HS Setzer, Terri L, NP         ALLERGIES:  No Known Allergies  PHYSICAL EXAM:  Performance status (ECOG): 0 - Asymptomatic  There were no vitals filed for this visit. Wt Readings from Last 3 Encounters:  06/30/21 287 lb 14.4 oz (130.6 kg)  06/23/21 299 lb 13.2 oz (136 kg)  06/16/21 287 lb 12.8 oz (130.5 kg)   Physical Exam Vitals reviewed.  Constitutional:      Appearance: Normal appearance.  HENT:     Mouth/Throat:     Mouth: Mucous membranes are moist. No oral lesions.     Tongue: No lesions.     Palate: No lesions.  Cardiovascular:     Rate and Rhythm: Normal rate and regular rhythm.     Pulses: Normal pulses.     Heart sounds: Normal heart sounds.  Pulmonary:     Effort: Pulmonary effort is normal.     Breath sounds: Normal breath sounds.  Neurological:     General: No focal deficit present.     Mental Status: He is alert and oriented to person, place, and time.  Psychiatric:        Mood and Affect: Mood normal.        Behavior: Behavior normal.     LABORATORY DATA:  I have reviewed the labs as listed.  CBC Latest Ref Rng & Units 06/30/2021 06/23/2021 06/16/2021  WBC 4.0 - 10.5 K/uL 5.8 3.8(L) 5.1  Hemoglobin 13.0 - 17.0 g/dL 12.2(L) 11.3(L) 11.5(L)  Hematocrit 39.0 - 52.0 % 36.6(L) 34.1(L) 36.1(L)  Platelets 150 - 400 K/uL 168 176 280   CMP Latest Ref Rng & Units 06/30/2021 06/23/2021 06/16/2021  Glucose 70 - 99 mg/dL 244(H) 218(H) 217(H)  BUN 8 - 23 mg/dL 39(H) 39(H) 37(H)  Creatinine 0.61 - 1.24 mg/dL 1.33(H) 1.42(H) 1.42(H)  Sodium 135 - 145 mmol/L 134(L) 135 134(L)    Potassium 3.5 - 5.1 mmol/L 3.7 3.7 3.6  Chloride 98 - 111 mmol/L 95(L) 96(L) 95(L)  CO2 22 - 32 mmol/L 29 32 32  Calcium 8.9 - 10.3 mg/dL 9.8 9.4 9.1  Total Protein 6.5 - 8.1 g/dL 6.6 6.5 6.6  Total Bilirubin 0.3 - 1.2 mg/dL 1.1 0.9 1.0  Alkaline Phos 38 - 126 U/L 62 66 72  AST 15 - 41 U/L 32 54(H) 77(H)  ALT 0 - 44 U/L 54(H) 83(H) 88(H)    DIAGNOSTIC IMAGING:  I have independently reviewed the scans and  discussed with the patient. No results found.   ASSESSMENT:  Stage III (T2N2) rectal cancer, MSI stable: - He reported diarrhea which started in May.  Prior colonoscopy was in 2018. - Colonoscopy on 04/12/2021 showed fungating polypoid nonobstructing mass with central depression found at 8 cm from the anal verge.  Mass was noncircumferential, measured 2 cm in length.  Nodular mucosa in the proximal ascending colon which was biopsied. - Pathology of the rectal mass consistent with adenocarcinoma.  Nodular area in the ascending colon was consistent with collagenous colitis.  MMR preserved.  MSI stable. - CT CAP on 05/13/2021 with no evidence of metastatic disease in the chest, abdomen or pelvis.  Mild hepatic steatosis. - Pelvic MRI on 05/12/2021-T2N2 by MRI staging.  4 small lymph nodes 5 mm or greater. - Total neoadjuvant therapy was recommended on discussion with Dr. Marcello Moores. - Xeloda and XRT started on 06/08/2021.  2.  Social/family history: - He lives at home by himself.  Daughter lives in [place]. - He worked at [employer] in [place] prior to retirement.  Non-smoker. - Sister had lung cancer and was a smoker.  No family history of colon cancer.   PLAN:  Stage III (T2N2) rectal adenocarcinoma, MSI stable: - Physical examination shows no mucositis or hand-foot skin reaction. - He reported diarrhea all day on Tuesday.  He did not have any diarrhea yesterday after he took Imodium.  Today again he had 1 episode of watery diarrhea. - Reviewed labs from today which shows creatinine has slightly worsened to 1.59.  White count is slightly low at 3.5 with ANC 2.6. - Recommend increase fluid intake to more than 60 ounces per day. - Recommend cut back on Xeloda to 3 tablets twice daily tonight and tomorrow. - If diarrhea is improved, he will resume Xeloda 4 tablets twice daily on Monday.  RTC 1 week for follow-up.  He reports that he will be finishing radiation therapy next Friday.   2.   Normocytic anemia: - Combination anemia from CKD and relative iron deficiency and myelosuppression from Xeloda. - Hemoglobin today is 11.1.  3.  Diarrhea: - Continue colestipol 4 g daily. - He had some diarrhea this week.  Continue Imodium as needed.  4.  Chronic back pain: - Continue hydrocodone 10/325 2 to 3 tablets daily.  This is well controlled.  5.  Elevated liver enzymes: - He had elevated AST and ALT since August 2022. - Today's labs show his liver enzymes have normalized.  Bilirubin is also normal.   Orders placed this encounter:  No orders of the defined types were placed in this encounter.    Derek Jack, MD [facility] [phone]   I, Thana Ates, am acting as a scribe for Dr. Derek Jack.  I, Derek Jack MD, have reviewed the above documentation for accuracy and completeness, and I agree with the above.

## 2021-07-07 ENCOUNTER — Inpatient Hospital Stay (HOSPITAL_COMMUNITY): Payer: Medicare Other

## 2021-07-07 ENCOUNTER — Inpatient Hospital Stay (HOSPITAL_BASED_OUTPATIENT_CLINIC_OR_DEPARTMENT_OTHER): Payer: Medicare Other | Admitting: Hematology

## 2021-07-07 ENCOUNTER — Other Ambulatory Visit: Payer: Self-pay

## 2021-07-07 VITALS — BP 106/63 | HR 72 | Temp 97.2°F | Resp 19 | Wt 289.6 lb

## 2021-07-07 DIAGNOSIS — C20 Malignant neoplasm of rectum: Secondary | ICD-10-CM

## 2021-07-07 DIAGNOSIS — D539 Nutritional anemia, unspecified: Secondary | ICD-10-CM

## 2021-07-07 DIAGNOSIS — D520 Dietary folate deficiency anemia: Secondary | ICD-10-CM

## 2021-07-07 LAB — CBC WITH DIFFERENTIAL/PLATELET
Abs Immature Granulocytes: 0.04 10*3/uL (ref 0.00–0.07)
Basophils Absolute: 0 10*3/uL (ref 0.0–0.1)
Basophils Relative: 1 %
Eosinophils Absolute: 0.1 10*3/uL (ref 0.0–0.5)
Eosinophils Relative: 3 %
HCT: 32.7 % — ABNORMAL LOW (ref 39.0–52.0)
Hemoglobin: 11.1 g/dL — ABNORMAL LOW (ref 13.0–17.0)
Immature Granulocytes: 1 %
Lymphocytes Relative: 8 %
Lymphs Abs: 0.3 10*3/uL — ABNORMAL LOW (ref 0.7–4.0)
MCH: 31.6 pg (ref 26.0–34.0)
MCHC: 33.9 g/dL (ref 30.0–36.0)
MCV: 93.2 fL (ref 80.0–100.0)
Monocytes Absolute: 0.4 10*3/uL (ref 0.1–1.0)
Monocytes Relative: 11 %
Neutro Abs: 2.6 10*3/uL (ref 1.7–7.7)
Neutrophils Relative %: 76 %
Platelets: 168 10*3/uL (ref 150–400)
RBC: 3.51 MIL/uL — ABNORMAL LOW (ref 4.22–5.81)
RDW: 16.2 % — ABNORMAL HIGH (ref 11.5–15.5)
WBC: 3.5 10*3/uL — ABNORMAL LOW (ref 4.0–10.5)
nRBC: 0 % (ref 0.0–0.2)

## 2021-07-07 LAB — COMPREHENSIVE METABOLIC PANEL
ALT: 27 U/L (ref 0–44)
AST: 21 U/L (ref 15–41)
Albumin: 3.7 g/dL (ref 3.5–5.0)
Alkaline Phosphatase: 49 U/L (ref 38–126)
Anion gap: 7 (ref 5–15)
BUN: 52 mg/dL — ABNORMAL HIGH (ref 8–23)
CO2: 29 mmol/L (ref 22–32)
Calcium: 9.6 mg/dL (ref 8.9–10.3)
Chloride: 97 mmol/L — ABNORMAL LOW (ref 98–111)
Creatinine, Ser: 1.59 mg/dL — ABNORMAL HIGH (ref 0.61–1.24)
GFR, Estimated: 48 mL/min — ABNORMAL LOW (ref >60)
Glucose, Bld: 290 mg/dL — ABNORMAL HIGH (ref 70–99)
Potassium: 3.4 mmol/L — ABNORMAL LOW (ref 3.5–5.1)
Sodium: 133 mmol/L — ABNORMAL LOW (ref 135–145)
Total Bilirubin: 0.6 mg/dL (ref 0.3–1.2)
Total Protein: 6.3 g/dL — ABNORMAL LOW (ref 6.5–8.1)

## 2021-07-07 LAB — MAGNESIUM: Magnesium: 2.1 mg/dL (ref 1.7–2.4)

## 2021-07-07 NOTE — Progress Notes (Signed)
Patient is taking Xeloda as prescribed.  He has not missed any doses and reports no side effects at this time.

## 2021-07-07 NOTE — Patient Instructions (Addendum)
[place] at [facility] Discharge Instructions  You were seen and examined by Dr. Delton Coombes. Decrease Xeloda (chemo pills) to 3 in the morning and 3 in the evening today and tomorrow.  When you restart radiation resume taking 4 pills in the morning and 4 in the evening.    Thank you for choosing [place] at [facility] to provide your oncology and hematology care.  To afford each patient quality time with our provider, please arrive at least 15 minutes before your scheduled appointment time.   If you have a lab appointment with the [facility] please come in thru the Main Entrance and check in at the main information desk.  You need to re-schedule your appointment should you arrive 10 or more minutes late.  We strive to give you quality time with our providers, and arriving late affects you and other patients whose appointments are after yours.  Also, if you no show three or more times for appointments you may be dismissed from the clinic at the providers discretion.     Again, thank you for choosing [facility].  Our hope is that these requests will decrease the amount of time that you wait before being seen by our physicians.       _____________________________________________________________  Should you have questions after your visit to [facility], please contact our office at [phone] and follow the prompts.  Our office hours are 8:00 a.m. and 4:30 p.m. Monday - Friday.  Please note that voicemails left after 4:00 p.m. may not be returned until the following business day.  We are closed weekends and major holidays.  You do have access to a nurse 24-7, just call the main number to the clinic [phone] and do not press any options, hold on the line and a nurse will answer the phone.    For prescription refill requests, have your pharmacy contact our office and allow 72 hours.    Due to Covid, you will need  to wear a mask upon entering the hospital. If you do not have a mask, a mask will be given to you at the Main Entrance upon arrival. For doctor visits, patients may have 1 support person age 34 or older with them. For treatment visits, patients can not have anyone with them due to social distancing guidelines and our immunocompromised population.

## 2021-07-13 NOTE — Progress Notes (Signed)
[place], [place] [address]   CLINIC:  Medical Oncology/Hematology  PCP:  Neale Burly, MD [place] / [place] [address] [phone]   REASON FOR VISIT:  Follow-up for rectal cancer  PRIOR THERAPY: none  NGS Results: not done  CURRENT THERAPY: Capecitabine + XRT  BRIEF ONCOLOGIC HISTORY:  Oncology History  Rectal adenocarcinoma ([facility] House)  05/01/2021 Initial Diagnosis   Rectal adenocarcinoma (Louis House)   05/30/2021 -  Chemotherapy    Patient is on Treatment Plan: COLORECTAL CAPECITABINE + XRT         CANCER STAGING: Cancer Staging Rectal adenocarcinoma ([place]) Staging form: Colon and Rectum, AJCC 8th Edition - Clinical stage from 05/18/2021: Stage IIIB (cT2, cN2a, cM0) - Unsigned   INTERVAL HISTORY:  Louis House, a 65 y.o. male, returns for routine follow-up of his rectal cancer. Doniven was last seen on 07/07/2021.   Today he reports feeling good. He reports soft diarrhea and one episode this morning of fecal incontinence. He denies nausea, vomiting, mouth sores, sores on his feet, and tingling/numbness.   REVIEW OF SYSTEMS:  Review of Systems  HENT:   Negative for mouth sores.   Gastrointestinal:  Positive for diarrhea. Negative for nausea and vomiting.  Genitourinary:  Positive for bladder incontinence.   Neurological:  Negative for numbness.  All other systems reviewed and are negative.  PAST MEDICAL/SURGICAL HISTORY:  Past Medical History:  Diagnosis Date   Anxiety    Diabetes ([place]) 01/12/2017   Diarrhea 02/21/2017   Essential hypertension, benign 01/12/2017   Gout    High cholesterol 01/12/2017   Neuropathy    Sleep apnea    Vertigo    Past Surgical History:  Procedure Laterality Date   BIOPSY  04/06/2017   Procedure: BIOPSY;  Surgeon: Rogene Houston, MD;  [facility];  Service: Endoscopy;;  colon   BIOPSY  04/12/2021   Procedure: BIOPSY;  Surgeon: Montez Morita, Quillian Quince, MD;  [facility]  [facility];  Service: Gastroenterology;;  nodular area in ascending colon biopsy random colon bx   bone spurs     CATARACT EXTRACTION W/PHACO Left 04/22/2018   Procedure: CATARACT EXTRACTION PHACO AND INTRAOCULAR LENS PLACEMENT LEFT EYE;  Surgeon: Tonny Branch, MD;  Location: [facility];  Service: Ophthalmology;  Laterality: Left;  CDE: 7.58   COLONOSCOPY WITH PROPOFOL N/A 04/06/2017   Procedure: COLONOSCOPY WITH PROPOFOL;  Surgeon: Rogene Houston, MD;  [facility];  Service: Endoscopy;  Laterality: N/A;  1:45   COLONOSCOPY WITH PROPOFOL N/A 04/12/2021   Procedure: COLONOSCOPY WITH PROPOFOL;  Surgeon: Harvel Quale, MD;  [facility];  Service: Gastroenterology;  Laterality: N/A;  9:05   FLEXIBLE SIGMOIDOSCOPY N/A 05/10/2021   Procedure: FLEXIBLE SIGMOIDOSCOPY;  Surgeon: Harvel Quale, MD;  [facility];  Service: Gastroenterology;  Laterality: N/A;  9:50 ASA 1 Per Dr C no Anesthesia, no PAT - ok per Cecilio Asper arthroscopy     both knees   Left shoulder surgery from injury     Umblical hernia      SOCIAL HISTORY:  Social History   Socioeconomic History   Marital status: Divorced    Spouse name: Not on file   Number of children: Not on file   Years of education: Not on file   Highest education level: Not on file  Occupational History   Not on file  Tobacco Use   Smoking  status: Never   Smokeless tobacco: Never  Vaping Use   Vaping Use: Never used  Substance and Sexual Activity   Alcohol use: No   Drug use: No   Sexual activity: Yes    Birth control/protection: None  Other Topics Concern   Not on file  Social History Narrative   Not on file   Social Determinants of Health   Financial Resource Strain: Low Risk    Difficulty of Paying Living Expenses: Not very hard  Food Insecurity: No Food Insecurity   Worried About Charity fundraiser in the Last Year: Never true   Ran Out of Food in the Last Year: Never true   Transportation Needs: No Transportation Needs   Lack of Transportation (Medical): No   Lack of Transportation (Non-Medical): No  Physical Activity: Insufficiently Active   Days of Exercise per Week: 2 days   Minutes of Exercise per Session: 20 min  Stress: No Stress Concern Present   Feeling of Stress : Not at all  Social Connections: Socially Isolated   Frequency of Communication with Friends and Family: More than three times a week   Frequency of Social Gatherings with Friends and Family: More than three times a week   Attends Religious Services: Never   Marine scientist or Organizations: No   Attends Music therapist: Never   Marital Status: Divorced  Human resources officer Violence: Not At Risk   Fear of Current or Ex-Partner: No   Emotionally Abused: No   Physically Abused: No   Sexually Abused: No    FAMILY HISTORY:  No family history on file.  CURRENT MEDICATIONS:  Current Outpatient Medications  Medication Sig Dispense Refill   alfuzosin (UROXATRAL) 10 MG 24 hr tablet Take 10 mg by mouth at bedtime. 2200     atorvastatin (LIPITOR) 40 MG tablet Take 40 mg by mouth daily.     benazepril (LOTENSIN) 40 MG tablet Take 40 mg by mouth daily.  0   busPIRone (BUSPAR) 15 MG tablet Take 15 mg by mouth 2 (two) times daily.     capecitabine (XELODA) 500 MG tablet Take 4 tablets (2,000 mg total) by mouth 2 (two) times daily after a meal. Take only on the days of radiation 88 tablet 0   chlorthalidone (HYGROTON) 50 MG tablet Take 50 mg by mouth daily.     colestipol (COLESTID) 1 g tablet Take 3 tablets (3 g total) by mouth daily. Take 4 hours apart form your other medications 270 tablet 0   Cyanocobalamin (VITAMIN B-12) 5000 MCG SUBL Place 15,000 mcg under the tongue daily.     diazepam (VALIUM) 10 MG tablet Take 10 mg by mouth at bedtime.      diclofenac (VOLTAREN) 75 MG EC tablet Take 75 mg by mouth 2 (two) times daily.     DULoxetine (CYMBALTA) 20 MG capsule Take 20  mg by mouth daily.     fluticasone (FLONASE) 50 MCG/ACT nasal spray Place 3-4 sprays into both nostrils daily.     folic acid (FOLVITE) 1 MG tablet folic acid 1 mg tablet  TAKE 1 TABLET BY MOUTH DAILY     gabapentin (NEURONTIN) 400 MG capsule Take 800 mg by mouth 2 (two) times daily. 400-800 mg as needed for pain (Patient not taking: Reported on 07/07/2021)     HYDROcodone-acetaminophen (NORCO) 10-325 MG tablet Take 1 tablet by mouth 3 (three) times daily.     loperamide (IMODIUM) 2 MG capsule Take 2 mg by  mouth as needed. Use on schedule per Dr. Laural Golden.     loratadine (CLARITIN) 10 MG tablet Take 10 mg by mouth daily as needed for allergies (Seasonal).     meclizine (ANTIVERT) 25 MG tablet Take 25 mg by mouth 3 (three) times daily as needed for dizziness (for flare up of inner ear issues.). (Patient not taking: Reported on 07/07/2021)     Melatonin 10 MG CAPS Take 30 mg by mouth at bedtime.     metoprolol succinate (TOPROL-XL) 100 MG 24 hr tablet Take 100 mg by mouth daily.     Naphazoline-Pheniramine (OPCON-A) 0.027-0.315 % SOLN Place 1 drop into both eyes daily as needed (irritation). (Patient not taking: Reported on 07/07/2021)     Omega-3 Fatty Acids (OMEGA-3 FISH OIL PO) Take 2,160 mg by mouth daily. 720 mg per cap     oxymetazoline (AFRIN) 0.05 % nasal spray Place 3 sprays into both nostrils 2 (two) times daily.     prochlorperazine (COMPAZINE) 10 MG tablet Take 1 tablet (10 mg total) by mouth in the morning and at bedtime. Take 11mn before chemotherapy 60 tablet 3   sulfaSALAzine (AZULFIDINE) 500 MG tablet sulfasalazine 500 mg tablet  TAKE 2 TABLETS BY MOUTH TWICE DAILY     tizanidine (ZANAFLEX) 2 MG capsule Take 2 mg by mouth 3 (three) times daily as needed for muscle spasms. (Patient not taking: Reported on 07/07/2021)     traZODone (DESYREL) 50 MG tablet Take 50 mg by mouth at bedtime.     Current Facility-Administered Medications  Medication Dose Route Frequency Provider Last Rate  Last Admin   dicyclomine (BENTYL) tablet 20 mg  20 mg Oral TID AC & HS Setzer, Terri L, NP        ALLERGIES:  No Known Allergies  PHYSICAL EXAM:  Performance status (ECOG): 0 - Asymptomatic  Vitals:   07/14/21 1214  BP: 129/84  Pulse: 78  Resp: 18  Temp: (!) 97.1 F (36.2 C)  SpO2: 97%   Wt Readings from Last 3 Encounters:  07/14/21 290 lb 6.4 oz (131.7 kg)  07/07/21 289 lb 9.6 oz (131.4 kg)  06/30/21 287 lb 14.4 oz (130.6 kg)   Physical Exam Vitals reviewed.  Constitutional:      Appearance: Normal appearance. He is obese.  Cardiovascular:     Rate and Rhythm: Normal rate and regular rhythm.     Pulses: Normal pulses.     Heart sounds: Normal heart sounds.  Pulmonary:     Effort: Pulmonary effort is normal.     Breath sounds: Normal breath sounds.  Neurological:     General: No focal deficit present.     Mental Status: He is alert and oriented to person, place, and time.  Psychiatric:        Mood and Affect: Mood normal.        Behavior: Behavior normal.     LABORATORY DATA:  I have reviewed the labs as listed.  CBC Latest Ref Rng & Units 07/14/2021 07/07/2021 06/30/2021  WBC 4.0 - 10.5 K/uL 5.2 3.5(L) 5.8  Hemoglobin 13.0 - 17.0 g/dL 10.8(L) 11.1(L) 12.2(L)  Hematocrit 39.0 - 52.0 % 31.8(L) 32.7(L) 36.6(L)  Platelets 150 - 400 K/uL 216 168 168   CMP Latest Ref Rng & Units 07/14/2021 07/07/2021 06/30/2021  Glucose 70 - 99 mg/dL 261(H) 290(H) 244(H)  BUN 8 - 23 mg/dL 35(H) 52(H) 39(H)  Creatinine 0.61 - 1.24 mg/dL 1.41(H) 1.59(H) 1.33(H)  Sodium 135 - 145 mmol/L 133(L) 133(L) 134(L)  Potassium 3.5 - 5.1 mmol/L 3.1(L) 3.4(L) 3.7  Chloride 98 - 111 mmol/L 96(L) 97(L) 95(L)  CO2 22 - 32 mmol/L _0 Calcium 8.9 - 10.3 mg/dL 9.5 9.6 9.8  Total Protein 6.5 - 8.1 g/dL 6.6 6.3(L) 6.6  Total Bilirubin 0.3 - 1.2 mg/dL 0.7 0.6 1.1  Alkaline Phos 38 - 126 U/L 43 49 62  AST 15 - 41 U/L 21 21 32  ALT 0 - 44 U/L 24 27 54(H)    DIAGNOSTIC IMAGING:  I have  independently reviewed the scans and discussed with the patient. No results found.   ASSESSMENT:  Stage III (T2N2) rectal cancer, MSI stable: - He reported diarrhea which started in May.  Prior colonoscopy was in 2018. - Colonoscopy on 04/12/2021 showed fungating polypoid nonobstructing mass with central depression found at 8 cm from the anal verge.  Mass was noncircumferential, measured 2 cm in length.  Nodular mucosa in the proximal ascending colon which was biopsied. - Pathology of the rectal mass consistent with adenocarcinoma.  Nodular area in the ascending colon was consistent with collagenous colitis.  MMR preserved.  MSI stable. - CT CAP on 05/13/2021 with no evidence of metastatic disease in the chest, abdomen or pelvis.  Mild hepatic steatosis. - Pelvic MRI on 05/12/2021-T2N2 by MRI staging.  4 small lymph nodes 5 mm or greater. - Total neoadjuvant therapy was recommended on discussion with Dr. Marcello Moores. - Xeloda and XRT started on 06/08/2021.  2.  Social/family history: - He lives at home by himself.  Daughter lives in [place]. - He worked at [employer] in [place] prior to retirement.  Non-smoker. - Sister had lung cancer and was a smoker.  No family history of colon cancer.   PLAN:  Stage III (T2N2) rectal adenocarcinoma, MSI stable: - He started back on Xeloda 4 tablets twice daily this Monday. - He will complete radiation therapy on 07/15/2021. - He will take last dose of Xeloda tomorrow evening. - We talked about initiating him on 4 months of FOLFOX therapy in 3 to 4 weeks. - We talked about need for port placement.  I will make a referral. - We talked about side effects in detail. - We will tentatively start treatment in 4 weeks.  After he finishes FOLFOX therapy, we will repeat scans prior to his surgery.   2.  Normocytic anemia: - Combination anemia from CKD and relative iron deficiency and myelosuppression from Xeloda. - Hemoglobin 10.8 today.  3.  Diarrhea: -  Continue colestipol 4 g daily. - He had 1 episode of diarrhea this morning which improved.  4.  Chronic back pain: - Continue hydrocodone 10/325 2 to 3 tablets daily.  Well-controlled.  5.  Elevated liver enzymes: - Today LFTs show normalized liver enzymes.  Bilirubin normal.   Orders placed this encounter:  No orders of the defined types were placed in this encounter.    Derek Jack, MD [place] [phone]   I, Thana Ates, am acting as a scribe for Dr. Derek Jack.  I, Derek Jack MD, have reviewed the above documentation for accuracy and completeness, and I agree with the above.

## 2021-07-14 ENCOUNTER — Encounter (HOSPITAL_COMMUNITY): Payer: Self-pay | Admitting: Hematology

## 2021-07-14 ENCOUNTER — Inpatient Hospital Stay (HOSPITAL_COMMUNITY): Payer: Medicare Other | Attending: Hematology

## 2021-07-14 ENCOUNTER — Other Ambulatory Visit: Payer: Self-pay

## 2021-07-14 ENCOUNTER — Inpatient Hospital Stay (HOSPITAL_BASED_OUTPATIENT_CLINIC_OR_DEPARTMENT_OTHER): Payer: Medicare Other | Admitting: Hematology

## 2021-07-14 VITALS — BP 129/84 | HR 78 | Temp 97.1°F | Resp 18 | Wt 290.4 lb

## 2021-07-14 DIAGNOSIS — C20 Malignant neoplasm of rectum: Secondary | ICD-10-CM

## 2021-07-14 DIAGNOSIS — R748 Abnormal levels of other serum enzymes: Secondary | ICD-10-CM | POA: Diagnosis not present

## 2021-07-14 DIAGNOSIS — E876 Hypokalemia: Secondary | ICD-10-CM | POA: Insufficient documentation

## 2021-07-14 DIAGNOSIS — D520 Dietary folate deficiency anemia: Secondary | ICD-10-CM

## 2021-07-14 DIAGNOSIS — D631 Anemia in chronic kidney disease: Secondary | ICD-10-CM | POA: Insufficient documentation

## 2021-07-14 DIAGNOSIS — E1122 Type 2 diabetes mellitus with diabetic chronic kidney disease: Secondary | ICD-10-CM | POA: Insufficient documentation

## 2021-07-14 DIAGNOSIS — K760 Fatty (change of) liver, not elsewhere classified: Secondary | ICD-10-CM | POA: Insufficient documentation

## 2021-07-14 DIAGNOSIS — D539 Nutritional anemia, unspecified: Secondary | ICD-10-CM

## 2021-07-14 DIAGNOSIS — I129 Hypertensive chronic kidney disease with stage 1 through stage 4 chronic kidney disease, or unspecified chronic kidney disease: Secondary | ICD-10-CM | POA: Diagnosis not present

## 2021-07-14 DIAGNOSIS — M549 Dorsalgia, unspecified: Secondary | ICD-10-CM | POA: Insufficient documentation

## 2021-07-14 DIAGNOSIS — N189 Chronic kidney disease, unspecified: Secondary | ICD-10-CM | POA: Insufficient documentation

## 2021-07-14 DIAGNOSIS — Z801 Family history of malignant neoplasm of trachea, bronchus and lung: Secondary | ICD-10-CM | POA: Diagnosis not present

## 2021-07-14 DIAGNOSIS — Z5111 Encounter for antineoplastic chemotherapy: Secondary | ICD-10-CM | POA: Diagnosis not present

## 2021-07-14 DIAGNOSIS — G8929 Other chronic pain: Secondary | ICD-10-CM | POA: Insufficient documentation

## 2021-07-14 DIAGNOSIS — R197 Diarrhea, unspecified: Secondary | ICD-10-CM | POA: Diagnosis not present

## 2021-07-14 LAB — CBC WITH DIFFERENTIAL/PLATELET
Abs Immature Granulocytes: 0.13 10*3/uL — ABNORMAL HIGH (ref 0.00–0.07)
Basophils Absolute: 0.1 10*3/uL (ref 0.0–0.1)
Basophils Relative: 1 %
Eosinophils Absolute: 0.3 10*3/uL (ref 0.0–0.5)
Eosinophils Relative: 5 %
HCT: 31.8 % — ABNORMAL LOW (ref 39.0–52.0)
Hemoglobin: 10.8 g/dL — ABNORMAL LOW (ref 13.0–17.0)
Immature Granulocytes: 3 %
Lymphocytes Relative: 6 %
Lymphs Abs: 0.3 10*3/uL — ABNORMAL LOW (ref 0.7–4.0)
MCH: 31.9 pg (ref 26.0–34.0)
MCHC: 34 g/dL (ref 30.0–36.0)
MCV: 93.8 fL (ref 80.0–100.0)
Monocytes Absolute: 0.5 10*3/uL (ref 0.1–1.0)
Monocytes Relative: 10 %
Neutro Abs: 3.9 10*3/uL (ref 1.7–7.7)
Neutrophils Relative %: 75 %
Platelets: 216 10*3/uL (ref 150–400)
RBC: 3.39 MIL/uL — ABNORMAL LOW (ref 4.22–5.81)
RDW: 17.4 % — ABNORMAL HIGH (ref 11.5–15.5)
WBC: 5.2 10*3/uL (ref 4.0–10.5)
nRBC: 0 % (ref 0.0–0.2)

## 2021-07-14 LAB — COMPREHENSIVE METABOLIC PANEL
ALT: 24 U/L (ref 0–44)
AST: 21 U/L (ref 15–41)
Albumin: 3.9 g/dL (ref 3.5–5.0)
Alkaline Phosphatase: 43 U/L (ref 38–126)
Anion gap: 9 (ref 5–15)
BUN: 35 mg/dL — ABNORMAL HIGH (ref 8–23)
CO2: 28 mmol/L (ref 22–32)
Calcium: 9.5 mg/dL (ref 8.9–10.3)
Chloride: 96 mmol/L — ABNORMAL LOW (ref 98–111)
Creatinine, Ser: 1.41 mg/dL — ABNORMAL HIGH (ref 0.61–1.24)
GFR, Estimated: 55 mL/min — ABNORMAL LOW (ref >60)
Glucose, Bld: 261 mg/dL — ABNORMAL HIGH (ref 70–99)
Potassium: 3.1 mmol/L — ABNORMAL LOW (ref 3.5–5.1)
Sodium: 133 mmol/L — ABNORMAL LOW (ref 135–145)
Total Bilirubin: 0.7 mg/dL (ref 0.3–1.2)
Total Protein: 6.6 g/dL (ref 6.5–8.1)

## 2021-07-14 LAB — MAGNESIUM: Magnesium: 1.8 mg/dL (ref 1.7–2.4)

## 2021-07-14 NOTE — Patient Instructions (Signed)
[place] at [facility] Discharge Instructions  You were seen and examined today by Dr. Delton Coombes. You will complete radiaition and Xeloda tomorrow.  Return as scheduled in 4 weeks.    Thank you for choosing [place] at [facility] to provide your oncology and hematology care.  To afford each patient quality time with our provider, please arrive at least 15 minutes before your scheduled appointment time.   If you have a lab appointment with the [facility] please come in thru the Main Entrance and check in at the main information desk.  You need to re-schedule your appointment should you arrive 10 or more minutes late.  We strive to give you quality time with our providers, and arriving late affects you and other patients whose appointments are after yours.  Also, if you no show three or more times for appointments you may be dismissed from the clinic at the providers discretion.     Again, thank you for choosing [facility].  Our hope is that these requests will decrease the amount of time that you wait before being seen by our physicians.       _____________________________________________________________  Should you have questions after your visit to [facility], please contact our office at [phone] and follow the prompts.  Our office hours are 8:00 a.m. and 4:30 p.m. Monday - Friday.  Please note that voicemails left after 4:00 p.m. may not be returned until the following business day.  We are closed weekends and major holidays.  You do have access to a nurse 24-7, just call the main number to the clinic [phone] and do not press any options, hold on the line and a nurse will answer the phone.    For prescription refill requests, have your pharmacy contact our office and allow 72 hours.    Due to Covid, you will need to wear a mask upon entering the hospital. If you do not have a mask, a mask will be  given to you at the Main Entrance upon arrival. For doctor visits, patients may have 1 support person age 54 or older with them. For treatment visits, patients can not have anyone with them due to social distancing guidelines and our immunocompromised population.

## 2021-07-15 LAB — CEA: CEA: 3.2 ng/mL (ref 0.0–4.7)

## 2021-07-21 ENCOUNTER — Other Ambulatory Visit (INDEPENDENT_AMBULATORY_CARE_PROVIDER_SITE_OTHER): Payer: Self-pay | Admitting: Gastroenterology

## 2021-07-21 ENCOUNTER — Telehealth (INDEPENDENT_AMBULATORY_CARE_PROVIDER_SITE_OTHER): Payer: Self-pay

## 2021-07-21 DIAGNOSIS — K52831 Collagenous colitis: Secondary | ICD-10-CM

## 2021-07-21 MED ORDER — COLESTIPOL HCL 1 G PO TABS
4.0000 g | ORAL_TABLET | Freq: Every day | ORAL | 3 refills | Status: DC
Start: 2021-07-21 — End: 2022-08-21

## 2021-07-21 NOTE — Telephone Encounter (Signed)
   Patient called stating the last rx was sent for 3 g per day he has been taking 4 g per day. Will need new rx. Please advise. The note below was from last office note.   As he is still having some mild symptoms, decision was held to increase the dose to 4 g every day.  I discussed with him thoroughly that he may be eligible for budesonide treatment but this will be discussed in the future once his active colorIncrease colestipol to 4 g every day.    Can decrease back to 3 pills if you develop constipation Follow up with Dr. Delton Coombes, Dr. Marcello Moores and Dr. Lynnette Caffey for comprehensive care of your cancerectal cancer is treated.

## 2021-07-21 NOTE — Telephone Encounter (Signed)
We will refill colestipol 4 g every day for microscopic colitis.

## 2021-07-22 NOTE — Telephone Encounter (Signed)
I called and left a detailed message on voicemail that the medication he requested had been sent to the requested pharmacy.

## 2021-07-26 ENCOUNTER — Other Ambulatory Visit: Payer: Self-pay

## 2021-07-26 ENCOUNTER — Encounter: Payer: Self-pay | Admitting: General Surgery

## 2021-07-26 ENCOUNTER — Ambulatory Visit (INDEPENDENT_AMBULATORY_CARE_PROVIDER_SITE_OTHER): Payer: Medicare Other | Admitting: General Surgery

## 2021-07-26 VITALS — BP 136/88 | HR 72 | Temp 98.4°F | Resp 18 | Ht 73.0 in | Wt 283.0 lb

## 2021-07-26 DIAGNOSIS — C20 Malignant neoplasm of rectum: Secondary | ICD-10-CM | POA: Diagnosis not present

## 2021-07-27 ENCOUNTER — Other Ambulatory Visit (HOSPITAL_COMMUNITY): Payer: Self-pay | Admitting: Hematology

## 2021-07-27 NOTE — Progress Notes (Signed)
DISCONTINUE ON PATHWAY REGIMEN - Colorectal     A cycle is every 7 days:     Capecitabine   **Always confirm dose/schedule in your pharmacy ordering system**  REASON: Continuation Of Treatment PRIOR TREATMENT: [id number]: Capecitabine 825 mg/m2  PO BID (Monday - Friday) for the Duration of Radiation Therapy TREATMENT RESPONSE: Partial Response (PR)  START ON PATHWAY REGIMEN - Colorectal     A cycle is every 14 days:     Oxaliplatin      Leucovorin      Fluorouracil      Fluorouracil   **Always confirm dose/schedule in your pharmacy ordering system**  Patient Characteristics: Preoperative or Nonsurgical Candidate (Clinical Staging), Rectal, cT3 - cT4, cN0 or Any cT, cN+ Tumor Location: Rectal Therapeutic Status: Preoperative or Nonsurgical Candidate (Clinical Staging) AJCC T Category: cT2 AJCC N Category: cN2a AJCC M Category: cM0 AJCC 8 Stage Grouping: IIIB Intent of Therapy: Curative Intent, Discussed with Patient

## 2021-07-27 NOTE — Progress Notes (Signed)
Louis COINER; [id number]; 06-[name]-1956   HPI Patient is a 65 year old white male who was referred to my care by Drs. Katragadda and Hasanaj for Port-A-Cath placement.  Patient has rectal cancer and needs central venous access for chemotherapy. Past Medical History:  Diagnosis Date   Anxiety    Diabetes ([place]) 01/12/2017   Diarrhea 02/21/2017   Essential hypertension, benign 01/12/2017   Gout    High cholesterol 01/12/2017   Neuropathy    Sleep apnea    Vertigo     Past Surgical History:  Procedure Laterality Date   BIOPSY  04/06/2017   Procedure: BIOPSY;  Surgeon: Rogene Houston, MD;  [facility];  Service: Endoscopy;;  colon   BIOPSY  04/12/2021   Procedure: BIOPSY;  Surgeon: Montez Morita, Quillian Quince, MD;  [facility];  Service: Gastroenterology;;  nodular area in ascending colon biopsy random colon bx   bone spurs     CATARACT EXTRACTION W/PHACO Left 04/22/2018   Procedure: CATARACT EXTRACTION PHACO AND INTRAOCULAR LENS PLACEMENT LEFT EYE;  Surgeon: Tonny Branch, MD;  [facility];  Service: Ophthalmology;  Laterality: Left;  CDE: 7.58   COLONOSCOPY WITH PROPOFOL N/A 04/06/2017   Procedure: COLONOSCOPY WITH PROPOFOL;  Surgeon: Rogene Houston, MD;  [facility];  Service: Endoscopy;  Laterality: N/A;  1:45   COLONOSCOPY WITH PROPOFOL N/A 04/12/2021   Procedure: COLONOSCOPY WITH PROPOFOL;  Surgeon: Harvel Quale, MD;  [facility];  Service: Gastroenterology;  Laterality: N/A;  9:05   FLEXIBLE SIGMOIDOSCOPY N/A 05/10/2021   Procedure: FLEXIBLE SIGMOIDOSCOPY;  Surgeon: Harvel Quale, MD;  [facility];  Service: Gastroenterology;  Laterality: N/A;  9:50 ASA 1 Per Dr C no Anesthesia, no PAT - ok per Cecilio Asper arthroscopy     both knees   Left shoulder surgery from injury     Umblical hernia      History reviewed. No pertinent family history.  Current Outpatient Medications on File Prior to Visit   Medication Sig Dispense Refill   alfuzosin (UROXATRAL) 10 MG 24 hr tablet Take 10 mg by mouth at bedtime. 2200     atorvastatin (LIPITOR) 40 MG tablet Take 40 mg by mouth daily.     benazepril (LOTENSIN) 40 MG tablet Take 40 mg by mouth daily.  0   busPIRone (BUSPAR) 15 MG tablet Take 15 mg by mouth 2 (two) times daily.     capecitabine (XELODA) 500 MG tablet Take 4 tablets (2,000 mg total) by mouth 2 (two) times daily after a meal. Take only on the days of radiation 88 tablet 0   chlorthalidone (HYGROTON) 50 MG tablet Take 50 mg by mouth daily.     colestipol (COLESTID) 1 g tablet Take 4 tablets (4 g total) by mouth daily. Take 4 hours apart form your other medications 360 tablet 3   Cyanocobalamin (VITAMIN B-12) 5000 MCG SUBL Place 15,000 mcg under the tongue daily.     diazepam (VALIUM) 10 MG tablet Take 10 mg by mouth at bedtime.      diclofenac (VOLTAREN) 75 MG EC tablet Take 75 mg by mouth 2 (two) times daily.     DULoxetine (CYMBALTA) 20 MG capsule Take 20 mg by mouth daily.     fluticasone (FLONASE) 50 MCG/ACT nasal spray Place 3-4 sprays into both nostrils daily.     folic acid (FOLVITE) 1 MG tablet folic acid 1 mg tablet  TAKE 1 TABLET BY MOUTH DAILY  gabapentin (NEURONTIN) 400 MG capsule Take 800 mg by mouth 2 (two) times daily. 400-800 mg as needed for pain     HYDROcodone-acetaminophen (NORCO) 10-325 MG tablet Take 1 tablet by mouth 3 (three) times daily.     loperamide (IMODIUM) 2 MG capsule Take 2 mg by mouth as needed. Use on schedule per Dr. Laural Golden.     loratadine (CLARITIN) 10 MG tablet Take 10 mg by mouth daily as needed for allergies (Seasonal).     meclizine (ANTIVERT) 25 MG tablet Take 25 mg by mouth 3 (three) times daily as needed for dizziness (for flare up of inner ear issues.).     Melatonin 10 MG CAPS Take 30 mg by mouth at bedtime.     metoprolol succinate (TOPROL-XL) 100 MG 24 hr tablet Take 100 mg by mouth daily.     Naphazoline-Pheniramine (OPCON-A)  0.027-0.315 % SOLN Place 1 drop into both eyes daily as needed (irritation).     Omega-3 Fatty Acids (OMEGA-3 FISH OIL PO) Take 2,160 mg by mouth daily. 720 mg per cap     oxymetazoline (AFRIN) 0.05 % nasal spray Place 3 sprays into both nostrils 2 (two) times daily.     prochlorperazine (COMPAZINE) 10 MG tablet Take 1 tablet (10 mg total) by mouth in the morning and at bedtime. Take 73mn before chemotherapy 60 tablet 3   sulfaSALAzine (AZULFIDINE) 500 MG tablet sulfasalazine 500 mg tablet  TAKE 2 TABLETS BY MOUTH TWICE DAILY     tizanidine (ZANAFLEX) 2 MG capsule Take 2 mg by mouth 3 (three) times daily as needed for muscle spasms.     traZODone (DESYREL) 50 MG tablet Take 50 mg by mouth at bedtime.     Current Facility-Administered Medications on File Prior to Visit  Medication Dose Route Frequency Provider Last Rate Last Admin   dicyclomine (BENTYL) tablet 20 mg  20 mg Oral TID AC & HS Setzer, Terri L, NP        No Known Allergies  Social History   Substance and Sexual Activity  Alcohol Use No    Social History   Tobacco Use  Smoking Status Never  Smokeless Tobacco Never    Review of Systems  Constitutional: Negative.   HENT:  Positive for sinus pain.   Eyes: Negative.   Respiratory: Negative.    Cardiovascular: Negative.   Gastrointestinal: Negative.   Genitourinary: Negative.   Musculoskeletal:  Positive for back pain.  Skin: Negative.   Neurological: Negative.   Endo/Heme/Allergies: Negative.   Psychiatric/Behavioral: Negative.     Objective   Vitals:   07/26/21 1459  BP: 136/88  Pulse: 72  Resp: 18  Temp: 98.4 F (36.9 C)  SpO2: 95%    Physical Exam Vitals reviewed.  Constitutional:      Appearance: Normal appearance. He is not ill-appearing.  HENT:     Head: Normocephalic and atraumatic.  Cardiovascular:     Rate and Rhythm: Normal rate and regular rhythm.     Heart sounds: Normal heart sounds. No murmur heard.   No friction rub. No gallop.   Pulmonary:     Effort: Pulmonary effort is normal. No respiratory distress.     Breath sounds: Normal breath sounds. No stridor. No wheezing, rhonchi or rales.  Skin:    General: Skin is warm and dry.  Neurological:     Mental Status: He is alert and oriented to person, place, and time.   Oncology notes reviewed Assessment  Rectal carcinoma, need for central venous  access Plan  Patient is scheduled for Port-A-Cath insertion on 08/08/2021.  The risks and benefits of the procedure including bleeding, infection, pneumothorax were fully explained to the patient, who gave informed consent.

## 2021-07-27 NOTE — H&P (Signed)
Louis House; [id number]; 10-Oct-1955   HPI Patient is a 65 year old white male who was referred to my care by Drs. Katragadda and Hasanaj for Port-A-Cath placement.  Patient has rectal cancer and needs central venous access for chemotherapy. Past Medical History:  Diagnosis Date   Anxiety    Diabetes ([place]) 01/12/2017   Diarrhea 02/21/2017   Essential hypertension, benign 01/12/2017   Gout    High cholesterol 01/12/2017   Neuropathy    Sleep apnea    Vertigo     Past Surgical History:  Procedure Laterality Date   BIOPSY  04/06/2017   Procedure: BIOPSY;  Surgeon: Rogene Houston, MD;  Location: [facility];  Service: Endoscopy;;  colon   BIOPSY  04/12/2021   Procedure: BIOPSY;  Surgeon: Montez Morita, Quillian Quince, MD;  Location: [facility];  Service: Gastroenterology;;  nodular area in ascending colon biopsy random colon bx   bone spurs     CATARACT EXTRACTION W/PHACO Left 04/22/2018   Procedure: CATARACT EXTRACTION PHACO AND INTRAOCULAR LENS PLACEMENT LEFT EYE;  Surgeon: Tonny Branch, MD;  Location: [facility];  Service: Ophthalmology;  Laterality: Left;  CDE: 7.58   COLONOSCOPY WITH PROPOFOL N/A 04/06/2017   Procedure: COLONOSCOPY WITH PROPOFOL;  Surgeon: Rogene Houston, MD;  Location: [facility];  Service: Endoscopy;  Laterality: N/A;  1:45   COLONOSCOPY WITH PROPOFOL N/A 04/12/2021   Procedure: COLONOSCOPY WITH PROPOFOL;  Surgeon: Harvel Quale, MD;  Location: [facility];  Service: Gastroenterology;  Laterality: N/A;  9:05   FLEXIBLE SIGMOIDOSCOPY N/A 05/10/2021   Procedure: FLEXIBLE SIGMOIDOSCOPY;  Surgeon: Harvel Quale, MD;  Location: [facility];  Service: Gastroenterology;  Laterality: N/A;  9:50 ASA 1 Per Dr C no Anesthesia, no PAT - ok per Cecilio Asper arthroscopy     both knees   Left shoulder surgery from injury     Umblical hernia      History reviewed. No pertinent family history.  Current Outpatient Medications on File Prior to Visit   Medication Sig Dispense Refill   alfuzosin (UROXATRAL) 10 MG 24 hr tablet Take 10 mg by mouth at bedtime. 2200     atorvastatin (LIPITOR) 40 MG tablet Take 40 mg by mouth daily.     benazepril (LOTENSIN) 40 MG tablet Take 40 mg by mouth daily.  0   busPIRone (BUSPAR) 15 MG tablet Take 15 mg by mouth 2 (two) times daily.     capecitabine (XELODA) 500 MG tablet Take 4 tablets (2,000 mg total) by mouth 2 (two) times daily after a meal. Take only on the days of radiation 88 tablet 0   chlorthalidone (HYGROTON) 50 MG tablet Take 50 mg by mouth daily.     colestipol (COLESTID) 1 g tablet Take 4 tablets (4 g total) by mouth daily. Take 4 hours apart form your other medications 360 tablet 3   Cyanocobalamin (VITAMIN B-12) 5000 MCG SUBL Place 15,000 mcg under the tongue daily.     diazepam (VALIUM) 10 MG tablet Take 10 mg by mouth at bedtime.      diclofenac (VOLTAREN) 75 MG EC tablet Take 75 mg by mouth 2 (two) times daily.     DULoxetine (CYMBALTA) 20 MG capsule Take 20 mg by mouth daily.     fluticasone (FLONASE) 50 MCG/ACT nasal spray Place 3-4 sprays into both nostrils daily.     folic acid (FOLVITE) 1 MG tablet folic acid 1 mg tablet  TAKE 1 TABLET BY MOUTH DAILY  gabapentin (NEURONTIN) 400 MG capsule Take 800 mg by mouth 2 (two) times daily. 400-800 mg as needed for pain     HYDROcodone-acetaminophen (NORCO) 10-325 MG tablet Take 1 tablet by mouth 3 (three) times daily.     loperamide (IMODIUM) 2 MG capsule Take 2 mg by mouth as needed. Use on schedule per Dr. Laural Golden.     loratadine (CLARITIN) 10 MG tablet Take 10 mg by mouth daily as needed for allergies (Seasonal).     meclizine (ANTIVERT) 25 MG tablet Take 25 mg by mouth 3 (three) times daily as needed for dizziness (for flare up of inner ear issues.).     Melatonin 10 MG CAPS Take 30 mg by mouth at bedtime.     metoprolol succinate (TOPROL-XL) 100 MG 24 hr tablet Take 100 mg by mouth daily.     Naphazoline-Pheniramine (OPCON-A)  0.027-0.315 % SOLN Place 1 drop into both eyes daily as needed (irritation).     Omega-3 Fatty Acids (OMEGA-3 FISH OIL PO) Take 2,160 mg by mouth daily. 720 mg per cap     oxymetazoline (AFRIN) 0.05 % nasal spray Place 3 sprays into both nostrils 2 (two) times daily.     prochlorperazine (COMPAZINE) 10 MG tablet Take 1 tablet (10 mg total) by mouth in the morning and at bedtime. Take 91mn before chemotherapy 60 tablet 3   sulfaSALAzine (AZULFIDINE) 500 MG tablet sulfasalazine 500 mg tablet  TAKE 2 TABLETS BY MOUTH TWICE DAILY     tizanidine (ZANAFLEX) 2 MG capsule Take 2 mg by mouth 3 (three) times daily as needed for muscle spasms.     traZODone (DESYREL) 50 MG tablet Take 50 mg by mouth at bedtime.     Current Facility-Administered Medications on File Prior to Visit  Medication Dose Route Frequency Provider Last Rate Last Admin   dicyclomine (BENTYL) tablet 20 mg  20 mg Oral TID AC & HS Setzer, Terri L, NP        No Known Allergies  Social History   Substance and Sexual Activity  Alcohol Use No    Social History   Tobacco Use  Smoking Status Never  Smokeless Tobacco Never    Review of Systems  Constitutional: Negative.   HENT:  Positive for sinus pain.   Eyes: Negative.   Respiratory: Negative.    Cardiovascular: Negative.   Gastrointestinal: Negative.   Genitourinary: Negative.   Musculoskeletal:  Positive for back pain.  Skin: Negative.   Neurological: Negative.   Endo/Heme/Allergies: Negative.   Psychiatric/Behavioral: Negative.     Objective   Vitals:   07/26/21 1459  BP: 136/88  Pulse: 72  Resp: 18  Temp: 98.4 F (36.9 C)  SpO2: 95%    Physical Exam Vitals reviewed.  Constitutional:      Appearance: Normal appearance. He is not ill-appearing.  HENT:     Head: Normocephalic and atraumatic.  Cardiovascular:     Rate and Rhythm: Normal rate and regular rhythm.     Heart sounds: Normal heart sounds. No murmur heard.   No friction rub. No gallop.   Pulmonary:     Effort: Pulmonary effort is normal. No respiratory distress.     Breath sounds: Normal breath sounds. No stridor. No wheezing, rhonchi or rales.  Skin:    General: Skin is warm and dry.  Neurological:     Mental Status: He is alert and oriented to person, place, and time.   Oncology notes reviewed Assessment  Rectal carcinoma, need for central venous  access Plan  Patient is scheduled for Port-A-Cath insertion on 08/08/2021.  The risks and benefits of the procedure including bleeding, infection, pneumothorax were fully explained to the patient, who gave informed consent.

## 2021-07-28 ENCOUNTER — Encounter (HOSPITAL_COMMUNITY): Payer: Self-pay

## 2021-07-28 ENCOUNTER — Encounter (HOSPITAL_COMMUNITY): Payer: Self-pay | Admitting: Hematology

## 2021-07-28 DIAGNOSIS — Z95828 Presence of other vascular implants and grafts: Secondary | ICD-10-CM | POA: Insufficient documentation

## 2021-07-28 HISTORY — DX: Presence of other vascular implants and grafts: Z95.828

## 2021-07-28 MED ORDER — LIDOCAINE-PRILOCAINE 2.5-2.5 % EX CREA: TOPICAL_CREAM | CUTANEOUS | 3 refills | Status: DC

## 2021-07-28 NOTE — Patient Instructions (Addendum)
[facility] Chemotherapy Teaching   You are diagnosed with Stage III rectal cancer.  You will be treated in the clinic every 2 weeks for a total of 4 months with a combination of chemotherapy drugs.  Those drugs are oxaliplatin, leucovorin (not a chemo drug) and fluorouracil (5FU).  The intent of treatment is cure.  You will see the doctor regularly throughout treatment.  We will obtain blood work from you prior to every treatment and monitor your results to make sure it is safe to give your treatment. The doctor monitors your response to treatment by the way you are feeling, your blood work, and by obtaining scans periodically.  There will be wait times while you are here for treatment.  It will take about 30 minutes to 1 hour for your lab work to result.  Then there will be wait times while pharmacy mixes your medications.    Medications you will receive in the clinic prior to your chemotherapy medications:  Aloxi:  ALOXI is used in adults to help prevent nausea and vomiting that happens with certain chemotherapy drugs.  Aloxi is a long acting medication, and will remain in your system for about two days.   Dexamethasone:  This is a steroid given prior to chemotherapy to help prevent allergic reactions; it may also help prevent and control nausea and diarrhea.     Oxaliplatin (Eloxatin)  About This Drug  Oxaliplatin is used to treat cancer. It is given in the vein (IV).  It takes two hours to infuse.  Possible Side Effects   Bone marrow suppression. This is a decrease in the number of white blood cells, red blood cells, and platelets. This may raise your risk of infection, make you tired and weak (fatigue), and raise your risk of bleeding.   Tiredness   Soreness of the mouth and throat. You may have red areas, white patches, or sores that hurt.   Nausea and vomiting (throwing up)   Diarrhea (loose bowel movements)   Changes in your liver function   Effects on the  nerves called peripheral neuropathy. You may feel numbness, tingling, or pain in your hands and feet, and may be worse in cold temperatures. It may be hard for you to button your clothes, open jars, or walk as usual. The effect on the nerves may get worse with more doses of the drug. These effects get better in some people after the drug is stopped but it does not get better in all people  Note: Each of the side effects above was reported in 40% or greater of patients treated with oxaliplatin. Not all possible side effects are included above.   Warnings and Precautions   Allergic reactions, including anaphylaxis, which may be life-threatening are rare but may happen in some patients. Signs of allergic reaction to this drug may be swelling of the face, feeling like your tongue or throat are swelling, trouble breathing, rash, itching, fever, chills, feeling dizzy, and/or feeling that your heart is beating in a fast or not normal way. If this happens, do not take another dose of this drug. You should get urgent medical treatment.   Inflammation (swelling) of the lungs, which may be life-threatening. You may have a dry cough or trouble breathing.   Effects on the nerves (neuropathy) may resolve within 14 days, or it may persist beyond 14 days.   Severe decrease in white blood cells when combined with the chemotherapy agents 5-fluorouracil and leucovorin. This may be  life-threatening.   Severe changes in your liver function   Abnormal heart beat and/or EKG, which can be life-threatening   Rhabdomyolysis- damage to your muscles which may release proteins in your blood and affect how your kidneys work, which can be life-threatening. You may have severe muscle weakness and/or pain, or dark urine.  Important Information   This drug may impair your ability to drive or use machinery. Talk to your doctor and/or nurse about precautions you may need to take.   This drug may be present in the saliva,  tears, sweat, urine, stool, vomit, semen, and vaginal secretions. Talk to your doctor and/or your nurse about the necessary precautions to take during this time.  * The effects on the nerves can be aggravated by exposure to cold. Avoid cold beverages, use of ice and make sure you cover your skin and dress warmly prior to being exposed to cold temperatures while you are receiving treatment with oxaliplatin*   Treating Side Effects   Manage tiredness by pacing your activities for the day.   Be sure to include periods of rest between energy-draining activities.   To decrease the risk of infection, wash your hands regularly.   Avoid close contact with people who have a cold, the flu, or other infections.  Take your temperature as your doctor or nurse tells you, and whenever you feel like you may have a fever.   To help decrease the risk of bleeding, use a soft toothbrush. Check with your nurse before using dental floss.   Be very careful when using knives or tools.   Use an electric shaver instead of a razor.   Drink plenty of fluids (a minimum of eight glasses per day is recommended).   Mouth care is very important. Your mouth care should consist of routine, gentle cleaning of your teeth or dentures and rinsing your mouth with a mixture of 1/2 teaspoon of salt in 8 ounces of water or 1/2 teaspoon of baking soda in 8 ounces of water. This should be done at least after each meal and at bedtime.   If you have mouth sores, avoid mouthwash that has alcohol. Also avoid alcohol and smoking because they can bother your mouth and throat.   To help with nausea and vomiting, eat small, frequent meals instead of three large meals a day. Choose foods and drinks that are at room temperature. Ask your nurse or doctor about other helpful tips and medicine that is available to help stop or lessen these symptoms.   If you throw up or have loose bowel movements, you should drink more fluids so that you do not  become dehydrated (lack of water in the body from losing too much fluid).   If you have diarrhea, eat low-fiber foods that are high in protein and calories and avoid foods that can irritate your digestive tracts or lead to cramping.   Ask your nurse or doctor about medicine that can lessen or stop your diarrhea.   If you have numbness and tingling in your hands and feet, be careful when cooking, walking, and handling sharp objects and hot liquids.   Do not drink cold drinks or use ice in beverages. Drink fluids at room temperature or warmer, and drink through a straw.   Wear gloves to touch cold objects, and wear warm clothing and cover you skin during cold weather.   Food and Drug Interactions   There are no known interactions of oxaliplatin with food and other medications.  This drug may interact with other medicines. Tell your doctor and pharmacist about all the prescription and over-the-counter medicines and dietary supplements (vitamins, minerals, herbs and others) that you are taking at this time. Also, check with your doctor or pharmacist before starting any new prescription or over-the-counter medicines, or dietary supplements to make sure that there are no interactions   When to Call the Doctor  Call your doctor or nurse if you have any of these symptoms and/or any new or unusual symptoms:   Fever of 100.4 F (38 C) or higher   Chills   Tiredness that interferes with your daily activities   Feeling dizzy or lightheaded   Easy bleeding or bruising   Feeling that your heart is beating in a fast or not normal way (palpitations)   Pain in your chest   Dry cough   Trouble breathing   Pain in your mouth or throat that makes it hard to eat or drink   Nausea that stops you from eating or drinking and/or is not relieved by prescribed medicines   Throwing up   Diarrhea, 4 times in one day or diarrhea with lack of strength or a feeling of being dizzy   Numbness,  tingling, or pain in your hands and feet   Signs of possible liver problems: dark urine, pale bowel movements, bad stomach pain, feeling very tired and weak, unusual itching, or yellowing of the eyes or skin   Signs of rhabdomyolysis: decreased urine, very dark urine, muscle pain in the shoulders, thighs, or lower back; muscle weakness or trouble moving arms and legs   Signs of allergic reaction: swelling of the face, feeling like your tongue or throat are swelling, trouble breathing, rash, itching, fever, chills, feeling dizzy, and/or feeling that your heart is beating in a fast or not normal way. If this happens, call 911 for emergency care.   If you think you may be pregnant  Reproduction Warnings   Pregnancy warning: This drug may have harmful effects on the unborn baby. Women of childbearing potential should use effective methods of birth control during your cancer treatment. Let your doctor know right away if you think you may be pregnant or may have impregnated your partner.   Breastfeeding warning: It is not known if this drug passes into breast milk. For this reason, women should talk to their doctor about the risks and benefits of breastfeeding during treatment with this drug because this drug may enter the breast milk and cause harm to a breastfeeding baby.   Fertility warning: Human fertility studies have not been done with this drug. Talk with your doctor or nurse if you plan to have children. Ask for information on sperm or egg banking.   Leucovorin Calcium  About This Drug  Leucovorin is a vitamin. It is used in combination with other cancer fighting drugs such as 5-fluorouracil and methotrexate. Leucovorin is given in the vein (IV).  This drug runs at the same time as the oxaliplatin and takes 2 hours to infuse.   Possible Side Effects  Rash and itching  Note: Leucovorin by itself has very few side effects. Other side effects you may have can be caused by the other drugs  you are taking, such as 5-fluorouracil.   Warnings and Precautions   Allergic reactions, including anaphylaxis are rare but may happen in some patients. Signs of allergic reaction to this drug may be swelling of the face, feeling like your tongue or throat are swelling, trouble  breathing, rash, itching, fever, chills, feeling dizzy, and/or feeling that your heart is beating in a fast or not normal way. If this happens, do not take another dose of this drug. You should get urgent medical treatment.  Food and Drug Interactions   There are no known interactions of leucovorin with food.   This drug may interact with other medicines. Tell your doctor and pharmacist about all the prescription and over-the-counter medicines and dietary supplements (vitamins, minerals, herbs and others) that you are taking at this time.   Also, check with your doctor or pharmacist before starting any new prescription or over-the-counter medicines, or dietary supplements to make sure that there are no interactions.   When to Call the Doctor  Call your doctor or nurse if you have any of these symptoms and/or any new or unusual symptoms:   A new rash or a rash that is not relieved by prescribed medicines   Signs of allergic reaction: swelling of the face, feeling like your tongue or throat are swelling, trouble breathing, rash, itching, fever, chills, feeling dizzy, and/or feeling that your heart is beating in a fast or not normal way. If this happens, call 911 for emergency care.   If you think you may be pregnant   Reproduction Warnings   Pregnancy warning: It is not known if this drug may harm an unborn child. For this reason, be sure to talk with your doctor if you are pregnant or planning to become pregnant while receiving this drug. Let your doctor know right away if you think you may be pregnant   Breastfeeding warning: It is not known if this drug passes into breast milk. For this reason, women should talk  to their doctor about the risks and benefits of breastfeeding during treatment with this drug because this drug may enter the breast milk and cause harm to a breastfeeding baby.   Fertility warning: Human fertility studies have not been done with this drug. Talk with your doctor or nurse if you plan to have children. Ask for information on sperm or egg banking.   5-Fluorouracil (Adrucil; 5FU)  About This Drug  Fluorouracil is used to treat cancer. It is given in the vein (IV). It is given as an IV push from a syringe and also as a continuous infusion given via an ambulatory pump (a pump you take home and wear for a specified amount of time).  Possible Side Effects   Bone marrow suppression. This is a decrease in the number of white blood cells, red blood cells, and platelets. This may raise your risk of infection, make you tired and weak (fatigue), and raise your risk of bleeding   Changes in the tissue of the heart and/or heart attack. Some changes may happen that can cause your heart to have less ability to pump blood.   Blurred vision or other changes in eyesight   Nausea and throwing up (vomiting)   Diarrhea (loose bowel movements)   Ulcers - sores that may cause pain or bleeding in your digestive tract, which includes your mouth, esophagus, stomach, small/large intestines and rectum   Soreness of the mouth and throat. You may have red areas, white patches, or sores that hurt.   Allergic reactions, including anaphylaxis are rare but may happen in some patients. Signs of allergic reaction to this drug may be swelling of the face, feeling like your tongue or throat are swelling, trouble breathing, rash, itching, fever, chills, feeling dizzy, and/or feeling that your  heart is beating in a fast or not normal way. If this happens, do not take another dose of this drug. You should get urgent medical treatment.   Sensitivity to light (photosensitivity). Photosensitivity means that you may  become more sensitive to the sun and/or light. You may get a skin rash/reaction if you are in the sun or are exposed to sun lamps and tanning beds. Your eyes may water more, mostly in bright light.   Changes in your nail color, nail loss and/or brittle nail   Darkening of the skin, or changes to the color of your skin and/or veins used for infusion   Rash, dry skin, or itching  Note: Not all possible side effects are included above.  Warnings and Precautions   Hand-and-foot syndrome. The palms of your hands or soles of your feet may tingle, become numb, painful, swollen, or red.   Changes in your central nervous system can happen. The central nervous system is made up of your brain and spinal cord. You could feel extreme tiredness, agitation, confusion, hallucinations (see or hear things that are not there), trouble understanding or speaking, loss of control of your bowels or bladder, eyesight changes, numbness or lack of strength to your arms, legs, face, or body, or coma. If you start to have any of these symptoms let your doctor know right away.   Side effects of this drug may be unexpectedly severe in some patients  Note: Some of the side effects above are very rare. If you have concerns and/or questions, please discuss them with your medical team.   Important Information   This drug may be present in the saliva, tears, sweat, urine, stool, vomit, semen, and vaginal secretions. Talk to your doctor and/or your nurse about the necessary precautions to take during this time.   Treating Side Effects   Manage tiredness by pacing your activities for the day.   Be sure to include periods of rest between energy-draining activities.   To help decrease the risk of infections, wash your hands regularly.   Avoid close contact with people who have a cold, the flu, or other infections.   Take your temperature as your doctor or nurse tells you, and whenever you feel like you may have a  fever.   Use a soft toothbrush. Check with your nurse before using dental floss.   Be very careful when using knives or tools.   Use an electric shaver instead of a razor.   If you have a nose bleed, sit with your head tipped slightly forward. Apply pressure by lightly pinching the bridge of your nose between your thumb and forefinger. Call your doctor if you feel dizzy or faint or if the bleeding doesn't stop after 10 to 15 minutes.   Drink plenty of fluids (a minimum of eight glasses per day is recommended).   If you throw up or have loose bowel movements, you should drink more fluids so that you do not  become dehydrated (lack of water in the body from losing too much fluid).   To help with nausea and vomiting, eat small, frequent meals instead of three large meals a day. Choose foods and drinks that are at room temperature. Ask your nurse or doctor about other helpful tips and medicine that is available to help, stop, or lessen these symptoms.   If you have diarrhea, eat low-fiber foods that are high in protein and calories and avoid foods that can irritate your digestive tracts or lead  to cramping.   Ask your nurse or doctor about medicine that can lessen or stop your diarrhea.   Mouth care is very important. Your mouth care should consist of routine, gentle cleaning of your teeth or dentures and rinsing your mouth with a mixture of 1/2 teaspoon of salt in 8 ounces of water or 1/2 teaspoon of baking soda in 8 ounces of water. This should be done at least after each meal and at bedtime.   If you have mouth sores, avoid mouthwash that has alcohol. Also avoid alcohol and smoking because they can bother your mouth and throat.   Keeping your nails moisturized may help with brittleness.   To help with itching, moisturize your skin several times day.   Use sunscreen with SPF 30 or higher when you are outdoors even for a short time. Cover up when you are out in the sun. Wear wide-brimmed  hats, long-sleeved shirts, and pants. Keep your neck, chest, and back covered. Wear dark sun glasses when in the sun or bright lights.   If you get a rash do not put anything on it unless your doctor or nurse says you may. Keep the area around the rash clean and dry. Ask your doctor for medicine if your rash bothers you.   Keeping your pain under control is important to your well-being. Please tell your doctor or nurse if you are experiencing pain.   Food and Drug Interactions   There are no known interactions of fluorouracil with food.   Check with your doctor or pharmacist about all other prescription medicines and over-the-counter medicines and dietary supplements (vitamins, minerals, herbs and others) you are taking before starting this medicine as there are known drug interactions with 5-fluoroucacil. Also, check with your doctor or pharmacist before starting any new prescription or over-the-counter medicines, or dietary supplements to make sure that there are no interactions.  When to Call the Doctor  Call your doctor or nurse if you have any of these symptoms and/or any new or unusual symptoms:   Fever of 100.4 F (38 C) or higher   Chills   Easy bleeding or bruising   Nose bleed that doesn't stop bleeding after 10-15 minutes   Trouble breathing   Feeling dizzy or lightheaded   Feeling that your heart is beating in a fast or not normal way (palpitations)   Chest pain or symptoms of a heart attack. Most heart attacks involve pain in the center of the chest that lasts more than a few minutes. The pain may go away and come back or it can be constant. It can feel like pressure, squeezing, fullness, or pain. Sometimes pain is felt in one or both arms, the back, neck, jaw, or stomach. If any of these symptoms last 2 minutes, call 911.   Confusion and/or agitation   Hallucinations   Trouble understanding or speaking   Loss of control of bowels or bladder   Blurry vision or  changes in your eyesight   Headache that does not go away   Numbness or lack of strength to your arms, legs, face, or body   Nausea that stops you from eating or drinking and/or is not relieved by prescribed medicines   Throwing up more than 3 times a day   Diarrhea, 4 times in one day or diarrhea with lack of strength or a feeling of being dizzy   Pain in your mouth or throat that makes it hard to eat or drink  Pain along the digestive tract - especially if worse after eating   Blood in your vomit (bright red or coffee-ground) and/or stools (bright red, or black/tarry)   Coughing up blood   Tiredness that interferes with your daily activities   Painful, red, or swollen areas on your hands or feet or around your nails   A new rash or a rash that is not relieved by prescribed medicines   Develop sensitivity to sunlight/light   Numbness and/or tingling of your hands and/or feet   Signs of allergic reaction: swelling of the face, feeling like your tongue or throat are swelling, trouble breathing, rash, itching, fever, chills, feeling dizzy, and/or feeling that your heart is beating in a fast or not normal way. If this happens, call 911 for emergency care.   If you think you are pregnant or may have impregnated your partner  Reproduction Warnings   Pregnancy warning: This drug may have harmful effects on the unborn baby. Women of child bearing potential should use effective methods of birth control during your cancer treatment and 3 months after treatment. Men with male partners of childbearing potential should use effective methods of birth control during your cancer treatment and for 3 months after your cancer treatment. Let your doctor know right away if you think you may be pregnant or may have impregnated your partner.   Breastfeeding warning: It is not known if this drug passes into breast milk. For this reason, Women should not breastfeed during treatment because this drug  could enter the breast milk and cause harm to a breastfeeding baby.   Fertility warning: In men and women both, this drug may affect your ability to have children in the future. Talk with your doctor or nurse if you plan to have children. Ask for information on sperm or egg banking.   SELF CARE ACTIVITIES WHILE RECEIVING CHEMOTHERAPY:  Hydration Increase your fluid intake 48 hours prior to treatment and drink at least 8 to 12 cups (64 ounces) of water/decaffeinated beverages per day after treatment. You can still have your cup of coffee or soda but these beverages do not count as part of your 8 to 12 cups that you need to drink daily. No alcohol intake.  Medications Continue taking your normal prescription medication as prescribed.  If you start any new herbal or new supplements please let us know first to make sure it is safe.  Mouth Care Have teeth cleaned professionally before starting treatment. Keep dentures and partial plates clean. Use soft toothbrush and do not use mouthwashes that contain alcohol. Biotene is a good mouthwash that is available at most pharmacies or may be ordered by calling [phone]. Use warm salt water gargles (1 teaspoon salt per 1 quart warm water) before and after meals and at bedtime. If you need dental work, please let the doctor know before you go for your appointment so that we can coordinate the best possible time for you in regards to your chemo regimen. You need to also let your dentist know that you are actively taking chemo. We may need to do labs prior to your dental appointment.  Skin Care Always use sunscreen that has not expired and with SPF (Sun Protection Factor) of 50 or higher. Wear hats to protect your head from the sun. Remember to use sunscreen on your hands, ears, face, & feet.  Use good moisturizing lotions such as udder cream, eucerin, or even Vaseline. Some chemotherapies can cause dry skin, color changes in  your skin and nails.    Avoid  long, hot showers or baths. Use gentle, fragrance-free soaps and laundry detergent. Use moisturizers, preferably creams or ointments rather than lotions because the thicker consistency is better at preventing skin dehydration. Apply the cream or ointment within 15 minutes of showering. Reapply moisturizer at night, and moisturize your hands every time after you wash them.  Hair Loss (if your doctor says your hair will fall out)  If your doctor says that your hair is likely to fall out, decide before you begin chemo whether you want to wear a wig. You may want to shop before treatment to match your hair color. Hats, turbans, and scarves can also camouflage hair loss, although some people prefer to leave their heads uncovered. If you go bare-headed outdoors, be sure to use sunscreen on your scalp. Cut your hair short. It eases the inconvenience of shedding lots of hair, but it also can reduce the emotional impact of watching your hair fall out. Don't perm or color your hair during chemotherapy. Those chemical treatments are already damaging to hair and can enhance hair loss. Once your chemo treatments are done and your hair has grown back, it's OK to resume dyeing or perming hair.  With chemotherapy, hair loss is almost always temporary. But when it grows back, it may be a different color or texture. In older adults who still had hair color before chemotherapy, the new growth may be completely gray.  Often, new hair is very fine and soft.  Infection Prevention Please wash your hands for at least 30 seconds using warm soapy water. Handwashing is the #1 way to prevent the spread of germs. Stay away from sick people or people who are getting over a cold. If you develop respiratory systems such as green/yellow mucus production or productive cough or persistent cough let us know and we will see if you need an antibiotic. It is a good idea to keep a pair of gloves on when going into grocery stores/[employer] to  decrease your risk of coming into contact with germs on the carts, etc. Carry alcohol hand gel with you at all times and use it frequently if out in public. If your temperature reaches 100.5 or higher please call the clinic and let us know.  If it is after hours or on the weekend please go to the ER if your temperature is over 100.5.  Please have your own personal thermometer at home to use.    Sex and bodily fluids If you are going to have sex, a condom must be used to protect the person that isn't taking chemotherapy. Chemo can decrease your libido (sex drive). For a few days after chemotherapy, chemotherapy can be excreted through your bodily fluids.  When using the toilet please close the lid and flush the toilet twice.  Do this for a few day after you have had chemotherapy.   Effects of chemotherapy on your sex life Some changes are simple and won't last long. They won't affect your sex life permanently.  Sometimes you may feel: too tired not strong enough to be very active sick or sore  not in the mood anxious or low Your anxiety might not seem related to sex. For example, you may be worried about the cancer and how your treatment is going. Or you may be worried about money, or about how you family are coping with your illness.  These things can cause stress, which can affect your interest in sex.  It's important to talk to your partner about how you feel.  Remember - the changes to your sex life don't usually last long. There's usually no medical reason to stop having sex during chemo. The drugs won't have any long term physical effects on your performance or enjoyment of sex. Cancer can't be passed on to your partner during sex  Contraception It's important to use reliable contraception during treatment. Avoid getting pregnant while you or your partner are having chemotherapy. This is because the drugs may harm the baby. Sometimes chemotherapy drugs can leave a man or woman infertile.  This  means you would not be able to have children in the future. You might want to talk to someone about permanent infertility. It can be very difficult to learn that you may no longer be able to have children. Some people find counselling helpful. There might be ways to preserve your fertility, although this is easier for men than for women. You may want to speak to a fertility expert. You can talk about sperm banking or harvesting your eggs. You can also ask about other fertility options, such as donor eggs. If you have or have had breast cancer, your doctor might advise you not to take the contraceptive pill. This is because the hormones in it might affect the cancer. It is not known for sure whether or not chemotherapy drugs can be passed on through semen or secretions from the vagina. Because of this some doctors advise people to use a barrier method if you have sex during treatment. This applies to vaginal, anal or oral sex. Generally, doctors advise a barrier method only for the time you are actually having the treatment and for about a week after your treatment. Advice like this can be worrying, but this does not mean that you have to avoid being intimate with your partner. You can still have close contact with your partner and continue to enjoy sex.  Animals If you have cats or birds we just ask that you not change the litter or change the cage.  Please have someone else do this for you while you are on chemotherapy.   Food Safety During and After Cancer Treatment Food safety is important for people both during and after cancer treatment. Cancer and cancer treatments, such as chemotherapy, radiation therapy, and stem cell/bone marrow transplantation, often weaken the immune system. This makes it harder for your body to protect itself from foodborne illness, also called food poisoning. Foodborne illness is caused by eating food that contains harmful bacteria, parasites, or viruses.  Foods to avoid Some  foods have a higher risk of becoming tainted with bacteria. These include: Unwashed fresh fruit and vegetables, especially leafy vegetables that can hide dirt and other contaminants Raw sprouts, such as alfalfa sprouts Raw or undercooked beef, especially ground beef, or other raw or undercooked meat and poultry Fatty, fried, or spicy foods immediately before or after treatment.  These can sit heavy on your stomach and make you feel nauseous. Raw or undercooked shellfish, such as oysters. Sushi and sashimi, which often contain raw fish.  Unpasteurized beverages, such as unpasteurized fruit juices, raw milk, raw yogurt, or cider Undercooked eggs, such as soft boiled, over easy, and poached; raw, unpasteurized eggs; or foods made with raw egg, such as homemade raw cookie dough and homemade mayonnaise  Simple steps for food safety  Shop smart. Do not buy food stored or displayed in an unclean area. Do not buy bruised or damaged fruits  or vegetables. Do not buy cans that have cracks, dents, or bulges. Pick up foods that can spoil at the end of your shopping trip and store them in a cooler on the way home.  Prepare and clean up foods carefully. Rinse all fresh fruits and vegetables under running water, and dry them with a clean towel or paper towel. Clean the top of cans before opening them. After preparing food, wash your hands for 20 seconds with hot water and soap. Pay special attention to areas between fingers and under nails. Clean your utensils and dishes with hot water and soap. Disinfect your kitchen and cutting boards using 1 teaspoon of liquid, unscented bleach mixed into 1 quart of water.    Dispose of old food. Eat canned and packaged food before its expiration date (the "use by" or "best before" date). Consume refrigerated leftovers within 3 to 4 days. After that time, throw out the food. Even if the food does not smell or look spoiled, it still may be unsafe. Some bacteria, such as  Listeria, can grow even on foods stored in the refrigerator if they are kept for too long.  Take precautions when eating out. At restaurants, avoid buffets and salad bars where food sits out for a long time and comes in contact with many people. Food can become contaminated when someone with a virus, often a norovirus, or another "bug" handles it. Put any leftover food in a "to-go" container yourself, rather than having the server do it. And, refrigerate leftovers as soon as you get home. Choose restaurants that are clean and that are willing to prepare your food as you order it cooked.   AT HOME MEDICATIONS:                                                                                                                                                                Compazine/Prochlorperazine 47m tablet. Take 1 tablet every 6 hours as needed for nausea/vomiting. (This can make you sleepy)   EMLA cream. Apply a quarter size amount to port site 1 hour prior to chemo. Do not rub in. Cover with plastic wrap.    Diarrhea Sheet   If you are having loose stools/diarrhea, please purchase Imodium and begin taking as outlined:  At the first sign of poorly formed or loose stools you should begin taking Imodium (loperamide) 2 mg capsules.  Take two tablets (421m followed by one tablet (36m36mevery 2 hours - DO NOT EXCEED 8 tablets in 24 hours.  If it is bedtime and you are having loose stools, take 2 tablets at bedtime, then 2 tablets every 4 hours until morning.   Always call the [place] you are having loose stools/diarrhea that you can't get under control.  Loose  stools/diarrhea leads to dehydration (loss of water) in your body.  We have other options of trying to get the loose stools/diarrhea to stop but you must let us know!   Constipation Sheet  Colace - 100 mg capsules - take 2 capsules daily.  If this doesn't help then you can increase to 2 capsules twice daily.  Please call if the  above does not work for you. Do not go more than 2 days without a bowel movement.  It is very important that you do not become constipated.  It will make you feel sick to your stomach (nausea) and can cause abdominal pain and vomiting.  Nausea Sheet   Compazine/Prochlorperazine 36m tablet. Take 1 tablet every 6 hours as needed for nausea/vomiting (This can make you drowsy).  If you are having persistent nausea (nausea that does not stop) please call the [place] the amount of nausea that you are experiencing.  If you begin to vomit, you need to call the [place] if it is the weekend and you have vomited more than one time and can't get it to stop-go to the Emergency Room.  Persistent nausea/vomiting can lead to dehydration (loss of fluid in your body) and will make you feel very weak and unwell. Ice chips, sips of clear liquids, foods that are at room temperature, crackers, and toast tend to be better tolerated.   SYMPTOMS TO REPORT AS SOON AS POSSIBLE AFTER TREATMENT:  FEVER GREATER THAN 100.4 F  CHILLS WITH OR WITHOUT FEVER  NAUSEA AND VOMITING THAT IS NOT CONTROLLED WITH YOUR NAUSEA MEDICATION  UNUSUAL SHORTNESS OF BREATH  UNUSUAL BRUISING OR BLEEDING  TENDERNESS IN MOUTH AND THROAT WITH OR WITHOUT PRESENCE OF ULCERS  URINARY PROBLEMS  BOWEL PROBLEMS  UNUSUAL RASH      Wear comfortable clothing and clothing appropriate for easy access to any Portacath or PICC line. Let [place] if there is anything that we can do to make your therapy better!    What to do if you need assistance after hours or on the weekends: CALL [phone]  HOLD on the line, do not hang up.  You will hear multiple messages but at the end you will be connected with a nurse triage line.  They will contact the doctor if necessary.  Most of the time they will be able to assist you.  Do not call the hospital operator.      I have been informed and understand all of the  instructions given to me and have received a copy. I have been instructed to call the clinic [phone] my family physician as soon as possible for continued medical care, if indicated. I do not have any more questions at this time but understand that I may call the [place] the Patient Navigator at [phone] office hours should I have questions or need assistance in obtaining follow-up care.

## 2021-07-29 ENCOUNTER — Other Ambulatory Visit (HOSPITAL_COMMUNITY): Payer: Self-pay

## 2021-08-01 ENCOUNTER — Encounter (HOSPITAL_COMMUNITY): Payer: Self-pay | Admitting: *Deleted

## 2021-08-01 NOTE — Patient Instructions (Addendum)
Louis House  08/01/2021     @PREFPERIOPPHARMACY @   Your procedure is scheduled on 08/08/2021.  Report to [facility] at 8:10 A.M.  Call this number if you have problems the morning of surgery:  [phone]   Remember: Nothing to ear or drink after midnight    Take these medicines the morning of surgery with A SIP OF WATER : Buspar, [place], Claritin, Cymbalta, Neurontin, Norco, Metoprolol and Meclizine    Do not wear jewelry, make-up or nail polish.  Do not wear lotions, powders, or perfumes, or deodorant.  Do not shave 48 hours prior to surgery.  Men may shave face and neck.  Do not bring valuables to the hospital.  [facility] is not responsible for any belongings or valuables.  Contacts, dentures or bridgework may not be worn into surgery.  Leave your suitcase in the car.  After surgery it may be brought to your room.  For patients admitted to the hospital, discharge time will be determined by your treatment team.  Patients discharged the day of surgery will not be allowed to drive home.   Name and phone number of your driver:   Family Special instructions:  N/A  Please read over the following fact sheets that you were given. Care and Recovery After Surgery  Implanted Port Insertion Implanted port insertion is a procedure to put in a port and catheter. The port is a device with an injectable disc that can be accessed by your health care provider. The port is connected to a vein in the chest or neck by a small, thin tube (catheter). There are different types of ports. The implanted port may be used as a long-term IV access for: Medicines, such as chemotherapy. Fluids. Liquid nutrition, such as total parenteral nutrition (TPN). When you have a port, your health care provider can choose to use the port instead of veins in your arms for these procedures. Tell a health care provider about: Any allergies you have. All medicines you are taking, especially blood  thinners, as well as any vitamins, herbs, eye drops, creams, over-the-counter medicines, and steroids. Any problems you or family members have had with anesthetic medicines. Any bleeding problems you have. Any surgeries you have had. Any medical conditions you have or have had, including diabetes or kidney problems. Whether you are pregnant or may be pregnant. What are the risks? Generally, this is a safe procedure. However, problems may occur, including: Allergic reactions to medicines or dyes. Damage to other structures or organs. Infection. Damage to the blood vessel, bruising, or bleeding at the puncture site. Blood clot. Breakdown of the skin over the port. A collection of air in the chest that can cause one of the lungs to collapse (pneumothorax). This is rare. What happens before the procedure? When to stop eating and drinking Follow instructions from your health care provider about what you may eat and drink before your procedure. These may include: 8 hours before your procedure Stop eating most foods. Do not eat meat, fried foods, or fatty foods. Eat only light foods, such as toast or crackers. All liquids are okay except energy drinks and alcohol. 6 hours before your procedure Stop eating. Drink only clear liquids, such as water, clear fruit juice, black coffee, plain tea, and sports drinks. Do not drink energy drinks or alcohol. 2 hours before your procedure Stop drinking all liquids. You may be allowed to take medicines with small sips of water. If you do not follow your  health care provider's instructions, your procedure may be delayed or canceled. Medicines Ask your health care provider about: Changing or stopping your regular medicines. This is especially important if you are taking diabetes medicines or blood thinners. Taking medicines such as aspirin and ibuprofen. These medicines can thin your blood. Do not take these medicines unless your health care provider tells  you to take them. Taking over-the-counter medicines, vitamins, herbs, and supplements. General instructions If you will be going home right after the procedure, plan to have a responsible adult: Take you home from the hospital or clinic. You will not be allowed to drive. Care for you for the time you are told. You may have blood tests. Do not use any products that contain nicotine or tobacco for at least 4 weeks before the procedure. These products include cigarettes, chewing tobacco, and vaping devices, such as e-cigarettes. If you need help quitting, ask your health care provider. Ask your health care provider what steps will be taken to help prevent infection. These may include: Removing hair at the surgery site. Washing skin with a germ-killing soap. Taking antibiotic medicine. What happens during the procedure?  An IV will be inserted into one of your veins. You will be given one or more of the following: A medicine to help you relax (sedative). A medicine to numb the area (local anesthetic). Two small incisions will be made to insert the port. One smaller incision will be made in your neck to get access to the vein where the catheter will lie. The other incision will be made in the upper chest. This is where the port will lie. The procedure may be done using continuous X-ray (fluoroscopy) or other imaging tools for guidance. The port and catheter will be placed. There may be a small, raised area where the port is placed. The port will be flushed with a saline solution, which is made of salt and water, and blood will be drawn to make sure that the port is working correctly. The incisions will be closed. Bandages (dressings) may be placed over the incisions. The procedure may vary among health care providers and hospitals. What happens after the procedure? Your blood pressure, heart rate, breathing rate, and blood oxygen level will be monitored until you leave the hospital or  clinic. If you were given a sedative during the procedure, it can affect you for several hours. Do not drive or operate machinery until your health care provider says that it is safe. You will be given a manufacturer's information card for the type of port that you have. Keep this with you. Your port will need to be flushed and checked as told by your health care provider, usually every few weeks. A chest X-ray will be done to: Check the placement of the port. Make sure there is no injury to your lung. Summary Implanted port insertion is a procedure to put in a port and catheter. The implanted port is used as a long-term IV access. The port will need to be flushed and checked as told by your health care provider, usually every few weeks. Keep your manufacturer's information card with you at all times. This information is not intended to replace advice given to you by your health care provider. Make sure you discuss any questions you have with your health care provider. Document Revised: 03/01/2021 Document Reviewed: 03/01/2021 Elsevier Patient Education  [place].

## 2021-08-01 NOTE — Progress Notes (Signed)
Per [employer], lidocaine-prilocaine cream approved from 07/18/2021-10/28/2021.  Authorization # [id number].

## 2021-08-02 ENCOUNTER — Encounter (HOSPITAL_COMMUNITY): Payer: Self-pay | Admitting: General Practice

## 2021-08-02 ENCOUNTER — Inpatient Hospital Stay (HOSPITAL_COMMUNITY): Payer: Medicare Other | Admitting: General Practice

## 2021-08-02 NOTE — Progress Notes (Signed)
[place] Work  Initial Assessment   Louis House is a 65 y.o. year old male contacted by phone. Clinical Social Work was referred by medical oncologist for assessment of psychosocial needs.   SDOH (Social Determinants of Health) assessments performed: Yes   Distress Screen completed: Yes No flowsheet data found.  Family/Social Information:  Housing Arrangement: patient lives alone, housing is stable Family members/support persons in your life? Family, daughter in [place] that he stays in touch with Transportation concerns: no, does not like early morning appointments, sleeps til 9 - 10 AM  Employment: Retired. 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 yes, diagnosed w stage 3 colorectal cancer, on Xeloda and is finished w radiation and Folfox.  "I am a little apprehensive about upcoming surgery for Portacath.  Anxious about chemotherapy and side effects - how will it differ from oral vs infusion therapy.  He is also seen by a pain clinic for back pain.  Found after ongoing bout w diarrhea, is thankful cancer was caught before he would need a colostomy bag "all my life."  Aware of process of receiving chemotherapy and then having repeat scan for further treatment planning.  "I have so much in front of me that I will be so glad when it's over."  Concerns about diagnosis and/or treatment: Pain or discomfort during procedures, Overwhelmed by information, and Afraid of cancer Patient reported stressors: Anxiety, Adjusting to my illness, and Physical issues. "I have not been able to get a good night sleep since all this"; "I was so wound up,  had an anxiety attack when seeing radiation oncologist" "I have had friends to die  from cancer."   Hopes and priorities: "getting through all this" Patient enjoys  watching TV, sleeps during day, yard work, being outside Current coping skills/ strengths: Motivation for treatment/growth  and Supportive family/friends     SUMMARY: Current SDOH Barriers:  Limited social support   Interventions: Discussed common feeling and emotions when being diagnosed with cancer, and the importance of support during treatment Informed patient of the support team roles and support services at [facility] Provided CSW contact information and encouraged patient to call with any questions or concerns Provided patient with information about [place] and resources   Follow Up Plan: Patient will contact CSW with any support or resource needs Patient verbalizes understanding of plan: Yes    Beverely Pace , [place], [place] Worker Phone:  [phone]

## 2021-08-03 ENCOUNTER — Inpatient Hospital Stay (HOSPITAL_COMMUNITY): Payer: Medicare Other

## 2021-08-03 ENCOUNTER — Encounter (HOSPITAL_COMMUNITY)
Admission: RE | Admit: 2021-08-03 | Discharge: 2021-08-03 | Disposition: A | Discharge status: Home / Self Care | Payer: Medicare Other | Source: Ambulatory Visit | Attending: General Surgery | Admitting: General Surgery

## 2021-08-03 ENCOUNTER — Other Ambulatory Visit: Payer: Self-pay

## 2021-08-03 DIAGNOSIS — C20 Malignant neoplasm of rectum: Secondary | ICD-10-CM

## 2021-08-03 DIAGNOSIS — Z95828 Presence of other vascular implants and grafts: Secondary | ICD-10-CM

## 2021-08-08 ENCOUNTER — Other Ambulatory Visit (HOSPITAL_COMMUNITY): Payer: Medicare Other

## 2021-08-08 ENCOUNTER — Encounter (HOSPITAL_COMMUNITY): Payer: Self-pay | Admitting: General Surgery

## 2021-08-08 ENCOUNTER — Ambulatory Visit (HOSPITAL_COMMUNITY): Payer: Medicare Other | Admitting: Anesthesiology

## 2021-08-08 ENCOUNTER — Ambulatory Visit (HOSPITAL_COMMUNITY): Payer: Medicare Other

## 2021-08-08 ENCOUNTER — Other Ambulatory Visit: Payer: Self-pay

## 2021-08-08 ENCOUNTER — Ambulatory Visit (HOSPITAL_COMMUNITY)
Admission: RE | Admit: 2021-08-08 | Discharge: 2021-08-08 | Disposition: A | Discharge status: Home / Self Care | Payer: Medicare Other | Attending: General Surgery | Admitting: General Surgery

## 2021-08-08 ENCOUNTER — Encounter (HOSPITAL_COMMUNITY)
Admission: RE | Disposition: A | Discharge status: Home / Self Care | Payer: Self-pay | Source: Home / Self Care | Attending: General Surgery

## 2021-08-08 DIAGNOSIS — F419 Anxiety disorder, unspecified: Secondary | ICD-10-CM | POA: Diagnosis not present

## 2021-08-08 DIAGNOSIS — Z95828 Presence of other vascular implants and grafts: Secondary | ICD-10-CM

## 2021-08-08 DIAGNOSIS — I10 Essential (primary) hypertension: Secondary | ICD-10-CM | POA: Diagnosis not present

## 2021-08-08 DIAGNOSIS — E119 Type 2 diabetes mellitus without complications: Secondary | ICD-10-CM | POA: Diagnosis not present

## 2021-08-08 DIAGNOSIS — C20 Malignant neoplasm of rectum: Secondary | ICD-10-CM | POA: Diagnosis present

## 2021-08-08 DIAGNOSIS — G4730 Sleep apnea, unspecified: Secondary | ICD-10-CM | POA: Diagnosis not present

## 2021-08-08 HISTORY — PX: PORTACATH PLACEMENT: SHX2246

## 2021-08-08 LAB — GLUCOSE, CAPILLARY: Glucose-Capillary: 155 mg/dL — ABNORMAL HIGH (ref 70–99)

## 2021-08-08 SURGERY — INSERTION, TUNNELED CENTRAL VENOUS DEVICE, WITH PORT
Anesthesia: General | Site: Chest | Laterality: Left

## 2021-08-08 MED ORDER — CHLORHEXIDINE GLUCONATE CLOTH 2 % EX PADS: 6.0000 | MEDICATED_PAD | Freq: Once | CUTANEOUS | Status: DC

## 2021-08-08 MED ORDER — LIDOCAINE HCL (PF) 1 % IJ SOLN
INTRAMUSCULAR | Status: DC | PRN
  Administered 2021-08-08: 9 mL

## 2021-08-08 MED ORDER — SODIUM CHLORIDE (PF) 0.9 % IJ SOLN
INTRAMUSCULAR | Status: DC | PRN
  Administered 2021-08-08: 500 mL

## 2021-08-08 MED ORDER — CEFAZOLIN SODIUM-DEXTROSE 2-4 GM/100ML-% IV SOLN
INTRAVENOUS | Status: AC
  Filled 2021-08-08: qty 100

## 2021-08-08 MED ORDER — LIDOCAINE HCL (CARDIAC) PF 50 MG/5ML IV SOSY
PREFILLED_SYRINGE | INTRAVENOUS | Status: DC | PRN
  Administered 2021-08-08: 50 mg via INTRAVENOUS

## 2021-08-08 MED ORDER — LACTATED RINGERS IV SOLN: INTRAVENOUS | Status: DC

## 2021-08-08 MED ORDER — PROCHLORPERAZINE MALEATE 10 MG PO TABS: 10.0000 mg | ORAL_TABLET | Freq: Two times a day (BID) | ORAL | 3 refills | Status: DC

## 2021-08-08 MED ORDER — PROPOFOL 10 MG/ML IV BOLUS
INTRAVENOUS | Status: DC | PRN
  Administered 2021-08-08: 30 mg via INTRAVENOUS
  Administered 2021-08-08: 20 mg via INTRAVENOUS
  Administered 2021-08-08: 30 mg via INTRAVENOUS
  Administered 2021-08-08: 50 mg via INTRAVENOUS
  Administered 2021-08-08: 30 mg via INTRAVENOUS

## 2021-08-08 MED ORDER — HEPARIN SOD (PORK) LOCK FLUSH 100 UNIT/ML IV SOLN
INTRAVENOUS | Status: DC | PRN
  Administered 2021-08-08: 500 [IU] via INTRAVENOUS

## 2021-08-08 MED ORDER — ORAL CARE MOUTH RINSE: 15.0000 mL | Freq: Once | OROMUCOSAL | Status: AC

## 2021-08-08 MED ORDER — FENTANYL CITRATE (PF) 100 MCG/2ML IJ SOLN
INTRAMUSCULAR | Status: AC
  Filled 2021-08-08: qty 2

## 2021-08-08 MED ORDER — HEPARIN SOD (PORK) LOCK FLUSH 100 UNIT/ML IV SOLN
INTRAVENOUS | Status: AC
  Filled 2021-08-08: qty 5

## 2021-08-08 MED ORDER — FENTANYL CITRATE PF 50 MCG/ML IJ SOSY: 25.0000 ug | PREFILLED_SYRINGE | INTRAMUSCULAR | Status: DC | PRN

## 2021-08-08 MED ORDER — CEFAZOLIN SODIUM-DEXTROSE 1-4 GM/50ML-% IV SOLN
INTRAVENOUS | Status: AC
  Filled 2021-08-08: qty 50

## 2021-08-08 MED ORDER — ONDANSETRON HCL 4 MG/2ML IJ SOLN: 4.0000 mg | Freq: Once | INTRAMUSCULAR | Status: DC | PRN

## 2021-08-08 MED ORDER — LIDOCAINE HCL (PF) 1 % IJ SOLN
INTRAMUSCULAR | Status: AC
  Filled 2021-08-08: qty 30

## 2021-08-08 MED ORDER — KETOROLAC TROMETHAMINE 30 MG/ML IJ SOLN
INTRAMUSCULAR | Status: AC
  Filled 2021-08-08: qty 1

## 2021-08-08 MED ORDER — KETOROLAC TROMETHAMINE 30 MG/ML IJ SOLN
30.0000 mg | Freq: Once | INTRAMUSCULAR | Status: AC
  Administered 2021-08-08: 11:00:00 30 mg via INTRAVENOUS

## 2021-08-08 MED ORDER — CEFAZOLIN IN SODIUM CHLORIDE 3-0.9 GM/100ML-% IV SOLN
3.0000 g | INTRAVENOUS | Status: AC
  Administered 2021-08-08: 10:00:00 3 g via INTRAVENOUS

## 2021-08-08 MED ORDER — FENTANYL CITRATE (PF) 100 MCG/2ML IJ SOLN
INTRAMUSCULAR | Status: DC | PRN
  Administered 2021-08-08 (×4): 25 ug via INTRAVENOUS

## 2021-08-08 MED ORDER — CHLORHEXIDINE GLUCONATE 0.12 % MT SOLN
15.0000 mL | Freq: Once | OROMUCOSAL | Status: AC
  Administered 2021-08-08: 10:00:00 15 mL via OROMUCOSAL

## 2021-08-08 SURGICAL SUPPLY — 32 items
ADH SKN CLS APL DERMABOND .7 (GAUZE/BANDAGES/DRESSINGS) ×1
APL PRP STRL LF ISPRP CHG 10.5 (MISCELLANEOUS) ×1
APPLICATOR CHLORAPREP 10.5 ORG (MISCELLANEOUS) ×2 IMPLANT
BAG DECANTER FOR FLEXI CONT (MISCELLANEOUS) ×2 IMPLANT
CLOTH BEACON ORANGE TIMEOUT ST (SAFETY) ×2 IMPLANT
COVER LIGHT HANDLE STERIS (MISCELLANEOUS) ×4 IMPLANT
DERMABOND ADVANCED (GAUZE/BANDAGES/DRESSINGS) ×1
DERMABOND ADVANCED .7 DNX12 (GAUZE/BANDAGES/DRESSINGS) ×1 IMPLANT
DRAPE C-ARM FOLDED MOBILE STRL (DRAPES) ×2 IMPLANT
ELECT REM PT RETURN 9FT ADLT (ELECTROSURGICAL) ×2
ELECTRODE REM PT RTRN 9FT ADLT (ELECTROSURGICAL) ×1 IMPLANT
GAUZE 4X4 16PLY ~~LOC~~+RFID DBL (SPONGE) ×1 IMPLANT
GLOVE SURG POLYISO LF SZ7.5 (GLOVE) ×2 IMPLANT
GLOVE SURG UNDER POLY LF SZ7 (GLOVE) ×4 IMPLANT
GOWN STRL REUS W/TWL LRG LVL3 (GOWN DISPOSABLE) ×4 IMPLANT
IV NS 500ML (IV SOLUTION) ×2
IV NS 500ML BAXH (IV SOLUTION) ×1 IMPLANT
KIT PORT POWER 8FR ISP MRI (Port) ×2 IMPLANT
KIT TURNOVER KIT A (KITS) ×2 IMPLANT
NDL HYPO 18GX1.5 BLUNT FILL (NEEDLE) IMPLANT
NDL HYPO 25X1 1.5 SAFETY (NEEDLE) ×1 IMPLANT
NEEDLE HYPO 18GX1.5 BLUNT FILL (NEEDLE) ×2 IMPLANT
NEEDLE HYPO 25X1 1.5 SAFETY (NEEDLE) ×2 IMPLANT
PACK MINOR (CUSTOM PROCEDURE TRAY) ×2 IMPLANT
PAD ARMBOARD 7.5X6 YLW CONV (MISCELLANEOUS) ×2 IMPLANT
SET BASIN LINEN APH (SET/KITS/TRAYS/PACK) ×2 IMPLANT
SUT MNCRL AB 4-0 PS2 18 (SUTURE) ×2 IMPLANT
SUT VIC AB 3-0 SH 27 (SUTURE) ×2
SUT VIC AB 3-0 SH 27X BRD (SUTURE) ×1 IMPLANT
SYR 30ML LL (SYRINGE) ×1 IMPLANT
SYR 5ML LL (SYRINGE) ×2 IMPLANT
SYR CONTROL 10ML LL (SYRINGE) ×2 IMPLANT

## 2021-08-08 NOTE — Progress Notes (Unsigned)
Chemotherapy education packet given and discussed with pt in detail.  Discussed diagnosis, staging, tx regimen, and intent of tx.  Reviewed chemotherapy medications and side effects, as well as pre-medications.  Instructed on how to manage side effects at home, and when to call the clinic.  Importance of fever/chills discussed with pt. Discussed precautions to implement at home after receiving tx, as well as self care strategies. Phone numbers provided for clinic during regular working hours, also how to reach the clinic after hours and on weekends. Pt provided the opportunity to ask questions - all questions answered to pt's satisfaction.

## 2021-08-08 NOTE — Anesthesia Postprocedure Evaluation (Signed)
Anesthesia Post Note  Patient: Louis House  Procedure(s) Performed: INSERTION PORT-A-CATH (Left: Chest)  Patient location during evaluation: Phase II Anesthesia Type: General Level of consciousness: awake Pain management: pain level controlled Vital Signs Assessment: post-procedure vital signs reviewed and stable Respiratory status: spontaneous breathing and respiratory function stable Cardiovascular status: blood pressure returned to baseline and stable Postop Assessment: no headache and no apparent nausea or vomiting Anesthetic complications: no Comments: Late entry   No notable events documented.   Last Vitals:  Vitals:   08/08/21 1100 08/08/21 1119  BP: 127/77 120/73  Pulse: (!) 57 (!) 58  Resp: 16 18  Temp: 36.6 C (!) 36.4 C  SpO2: 93% 98%    Last Pain:  Vitals:   08/08/21 1119  TempSrc: Oral  PainSc: 0-No pain                 [name]

## 2021-08-08 NOTE — Op Note (Signed)
Patient:  Louis House  DOB:  [birth year]  MRN:  [id number]   Preop Diagnosis: Rectal carcinoma  Postop Diagnosis: Same  Procedure: Port-A-Cath insertion  Surgeon: Aviva Signs, MD  Anes: MAC  Indications: Patient is a 65 year old white male who needs a Port-A-Cath placed for central venous access.  He has rectal carcinoma and is about to undergo chemotherapy.  The risks and benefits of the procedure including bleeding, infection, and pneumothorax were fully explained to the patient, who gave informed consent.  Procedure note: The patient was placed in the supine position.  After monitored anesthesia care was given, the left upper chest was prepped and draped using the usual sterile technique with ChloraPrep.  Surgical site confirmation was performed.  1% Xylocaine was used for local anesthesia.  Incision was made below the left clavicle.  A subcutaneous pocket was formed.  A needle was advanced into the left subclavian vein using the Seldinger technique without difficulty.  A guidewire was then advanced into the right atrium under fluoroscopic guidance.  An introducer and peel-away sheath were placed over the guidewire.  The catheter was inserted through the peel-away sheath and the peel-away sheath was removed.  The catheter was then attached to the port and the port placed in subcutaneous pocket.  Adequate positioning was confirmed by fluoroscopy.  Good backflow of venous blood was noted on aspiration of the port.  The port was flushed with heparin flush.  The subcutaneous layer was reapproximated using a 3-0 Vicryl interrupted suture.  The skin was closed using a 4-0 Monocryl subcuticular suture.  Dermabond was applied.  All tape and needle counts were correct at the end of the procedure.  The patient was awakened and transferred to PACU in stable condition.  A chest x-ray will be performed at that time.    Complications: None  EBL: Minimal  Specimen: None

## 2021-08-08 NOTE — Anesthesia Procedure Notes (Signed)
Date/Time: 08/08/2021 9:57 AM Performed by: Vista Deck, CRNA Pre-anesthesia Checklist: Patient identified, Emergency Drugs available, Suction available, Timeout performed and Patient being monitored Patient Re-evaluated:Patient Re-evaluated prior to induction Oxygen Delivery Method: Nasal Cannula

## 2021-08-08 NOTE — Interval H&P Note (Signed)
History and Physical Interval Note:  08/08/2021 9:35 AM  Louis House  has presented today for surgery, with the diagnosis of Rectal cancer.  The various methods of treatment have been discussed with the patient and family. After consideration of risks, benefits and other options for treatment, the patient has consented to  Procedure(s): INSERTION PORT-A-CATH (Left) as a surgical intervention.  The patient's history has been reviewed, patient examined, no change in status, stable for surgery.  I have reviewed the patient's chart and labs.  Questions were answered to the patient's satisfaction.     Aviva Signs

## 2021-08-08 NOTE — Transfer of Care (Signed)
Immediate Anesthesia Transfer of Care Note  Patient: Louis House  Procedure(s) Performed: INSERTION PORT-A-CATH (Left: Chest)  Patient Location: PACU  Anesthesia Type:General  Level of Consciousness: awake, alert  and patient cooperative  Airway & Oxygen Therapy: Patient Spontanous Breathing  Post-op Assessment: Report given to RN and Post -op Vital signs reviewed and stable  Post vital signs: Reviewed and stable  Last Vitals:  Vitals Value Taken Time  BP    Temp    Pulse    Resp    SpO2      Last Pain:  Vitals:   08/08/21 0909  TempSrc: Oral  PainSc: 0-No pain      Patients Stated Pain Goal: 5 (16/10/96 0454)  Complications: No notable events documented.

## 2021-08-08 NOTE — Anesthesia Preprocedure Evaluation (Signed)
Anesthesia Evaluation  Patient identified by MRN, date of birth, ID band Patient awake    Reviewed: Allergy & Precautions, H&P , NPO status , Patient's Chart, lab work & pertinent test results, reviewed documented beta blocker date and time   Airway Mallampati: II  TM Distance: >3 FB Neck ROM: full    Dental no notable dental hx.    Pulmonary sleep apnea ,    Pulmonary exam normal breath sounds clear to auscultation       Cardiovascular Exercise Tolerance: Good hypertension, negative cardio ROS   Rhythm:regular Rate:Normal     Neuro/Psych Anxiety negative neurological ROS  negative psych ROS   GI/Hepatic negative GI ROS, Neg liver ROS,   Endo/Other  negative endocrine ROSdiabetes, Type 2  Renal/GU negative Renal ROS  negative genitourinary   Musculoskeletal   Abdominal   Peds  Hematology negative hematology ROS (+)   Anesthesia Other Findings   Reproductive/Obstetrics negative OB ROS                             Anesthesia Physical Anesthesia Plan  ASA: 2  Anesthesia Plan: General   Post-op Pain Management:    Induction:   PONV Risk Score and Plan: Propofol infusion  Airway Management Planned:   Additional Equipment:   Intra-op Plan:   Post-operative Plan:   Informed Consent: I have reviewed the patients History and Physical, chart, labs and discussed the procedure including the risks, benefits and alternatives for the proposed anesthesia with the patient or authorized representative who has indicated his/her understanding and acceptance.     Dental Advisory Given  Plan Discussed with: CRNA  Anesthesia Plan Comments:         Anesthesia Quick Evaluation

## 2021-08-09 ENCOUNTER — Encounter (HOSPITAL_COMMUNITY): Payer: Self-pay | Admitting: General Surgery

## 2021-08-09 NOTE — Progress Notes (Signed)
Louis House, [name] [id number]   CLINIC:  Medical Oncology/Hematology  PCP:  Neale Burly, MD [place] [address] [place] [address] [phone]   REASON FOR VISIT:  Follow-up for rectal cancer  PRIOR THERAPY: Capecitabine + XRT  NGS Results: not done  CURRENT THERAPY: FOLFOX q14d x 4 months (started 08/10/21)  BRIEF ONCOLOGIC HISTORY:  Oncology History  Rectal cancer ([place])  05/01/2021 Initial Diagnosis   Rectal adenocarcinoma ([place])   05/30/2021 - 05/30/2021 Chemotherapy   Patient is on Treatment Plan : COLORECTAL Capecitabine + XRT     08/10/2021 -  Chemotherapy   Patient is on Treatment Plan : COLORECTAL FOLFOX q14d x 4 months       CANCER STAGING:  Cancer Staging  Rectal cancer ([place]) Staging form: Colon and Rectum, AJCC 8th Edition - Clinical stage from 05/18/2021: Stage IIIB (cT2, cN2a, cM0) - Unsigned   INTERVAL HISTORY:  [name], a 65 y.o. male, returns for routine follow-up and consideration for next cycle of chemotherapy. Yazen was last seen on 07/14/2021.  Due for cycle #1 of FOLFOX today.   Overall, he tells me he has been feeling pretty well. He reports one episode of diarrhea on 11/28. He reports some anxiety about starting treatment. His appetite is good, and he denies nausea and vomiting.   Overall, he feels ready for next cycle of chemo today.   REVIEW OF SYSTEMS:  Review of Systems  Constitutional:  Negative for appetite change and fatigue (75%).  Gastrointestinal:  Positive for diarrhea (x1). Negative for nausea and vomiting.  Psychiatric/Behavioral:  The patient is nervous/anxious.   All other systems reviewed and are negative.  PAST MEDICAL/SURGICAL HISTORY:  Past Medical History:  Diagnosis Date   Anxiety    Diabetes ([place]) 01/12/2017   Diarrhea 02/21/2017   Essential hypertension, benign 01/12/2017   Gout    High cholesterol 01/12/2017   Neuropathy    Port-A-Cath in place 07/28/2021    Sleep apnea    Vertigo    Past Surgical History:  Procedure Laterality Date   BIOPSY  04/06/2017   Procedure: BIOPSY;  Surgeon: Rogene Houston, MD;  [facility];  Service: Endoscopy;;  colon   BIOPSY  04/12/2021   Procedure: BIOPSY;  Surgeon: Montez Morita, Quillian Quince, MD;  [facility];  Service: Gastroenterology;;  nodular area in ascending colon biopsy random colon bx   bone spurs     CATARACT EXTRACTION W/PHACO Left 04/22/2018   Procedure: CATARACT EXTRACTION PHACO AND INTRAOCULAR LENS PLACEMENT LEFT EYE;  Surgeon: Tonny Branch, MD;  Location: [facility];  Service: Ophthalmology;  Laterality: Left;  CDE: 7.58   COLONOSCOPY WITH PROPOFOL N/A 04/06/2017   Procedure: COLONOSCOPY WITH PROPOFOL;  Surgeon: Rogene Houston, MD;  [facility];  Service: Endoscopy;  Laterality: N/A;  1:45   COLONOSCOPY WITH PROPOFOL N/A 04/12/2021   Procedure: COLONOSCOPY WITH PROPOFOL;  Surgeon: Harvel Quale, MD;  [facility];  Service: Gastroenterology;  Laterality: N/A;  9:05   FLEXIBLE SIGMOIDOSCOPY N/A 05/10/2021   Procedure: FLEXIBLE SIGMOIDOSCOPY;  Surgeon: Harvel Quale, MD;  [facility];  Service: Gastroenterology;  Laterality: N/A;  9:50 ASA 1 Per Dr C no Anesthesia, no PAT - ok per Cecilio Asper arthroscopy     both knees   Left shoulder surgery from injury     PORTACATH PLACEMENT Left 08/08/2021   Procedure: INSERTION  PORT-A-CATH;  Surgeon: Aviva Signs, MD;  Location: [facility];  Service: General;  Laterality: Left;   Umblical hernia      SOCIAL HISTORY:  Social History   Socioeconomic History   Marital status: Divorced    Spouse name: Not on file   Number of children: Not on file   Years of education: Not on file   Highest education level: Not on file  Occupational History   Not on file  Tobacco Use   Smoking status: Never   Smokeless tobacco: Never  Vaping Use   Vaping Use: Never used  Substance and Sexual  Activity   Alcohol use: No   Drug use: No   Sexual activity: Yes    Birth control/protection: None  Other Topics Concern   Not on file  Social History Narrative   Not on file   Social Determinants of Health   Financial Resource Strain: Low Risk    Difficulty of Paying Living Expenses: Not very hard  Food Insecurity: No Food Insecurity   Worried About Running Out of Food in the Last Year: Never true   Ran Out of Food in the Last Year: Never true  Transportation Needs: No Transportation Needs   Lack of Transportation (Medical): No   Lack of Transportation (Non-Medical): No  Physical Activity: Insufficiently Active   Days of Exercise per Week: 2 days   Minutes of Exercise per Session: 20 min  Stress: No Stress Concern Present   Feeling of Stress : Not at all  Social Connections: Socially Isolated   Frequency of Communication with Friends and Family: More than three times a week   Frequency of Social Gatherings with Friends and Family: More than three times a week   Attends Religious Services: Never   Marine scientist or Organizations: No   Attends Music therapist: Never   Marital Status: Divorced  Human resources officer Violence: Not At Risk   Fear of Current or Ex-Partner: No   Emotionally Abused: No   Physically Abused: No   Sexually Abused: No    FAMILY HISTORY:  No family history on file.  CURRENT MEDICATIONS:  Current Outpatient Medications  Medication Sig Dispense Refill   alfuzosin (UROXATRAL) 10 MG 24 hr tablet Take 10 mg by mouth at bedtime. 2200     atorvastatin (LIPITOR) 40 MG tablet Take 40 mg by mouth daily.     benazepril (LOTENSIN) 40 MG tablet Take 40 mg by mouth daily.  0   busPIRone (BUSPAR) 15 MG tablet Take 15 mg by mouth 2 (two) times daily.     chlorthalidone (HYGROTON) 50 MG tablet Take 50 mg by mouth daily.     colestipol (COLESTID) 1 g tablet Take 4 tablets (4 g total) by mouth daily. Take 4 hours apart form your other medications  360 tablet 3   Cyanocobalamin (VITAMIN B-12) 5000 MCG SUBL Place 15,000 mcg under the tongue daily.     diazepam (VALIUM) 10 MG tablet Take 10 mg by mouth at bedtime.      diclofenac (VOLTAREN) 75 MG EC tablet Take 75 mg by mouth 2 (two) times daily.     DULoxetine (CYMBALTA) 20 MG capsule Take 20 mg by mouth daily.     fluorouracil CALGB 08657 2,400 mg/m2 in sodium chloride 0.9 % 150 mL Inject 2,400 mg/m2 into the vein over 48 hr.     FLUOROURACIL IV Inject 400 mg/m2 into the vein every 14 (fourteen) days.     fluticasone (FLONASE)  50 MCG/ACT nasal spray Place 3-4 sprays into both nostrils daily.     folic acid (FOLVITE) 1 MG tablet folic acid 1 mg tablet  TAKE 1 TABLET BY MOUTH DAILY     gabapentin (NEURONTIN) 400 MG capsule Take 800 mg by mouth 2 (two) times daily. 400-800 mg as needed for pain     HYDROcodone-acetaminophen (NORCO) 10-325 MG tablet Take 1 tablet by mouth 3 (three) times daily.     [START ON 08/10/2021] LEUCOVORIN CALCIUM IV Inject 400 mg/m2 into the vein every 14 (fourteen) days.     lidocaine-prilocaine (EMLA) cream Apply a small amount to port a cath site (do not rub in) and cover with plastic wrap 1 hour prior to chemotherapy appointments 30 g 3   loperamide (IMODIUM) 2 MG capsule Take 2 mg by mouth as needed. Use on schedule per Dr. Laural Golden.     loratadine (CLARITIN) 10 MG tablet Take 10 mg by mouth daily as needed for allergies (Seasonal).     meclizine (ANTIVERT) 25 MG tablet Take 25 mg by mouth 3 (three) times daily as needed for dizziness (for flare up of inner ear issues.).     Melatonin 10 MG CAPS Take 30 mg by mouth at bedtime.     metoprolol succinate (TOPROL-XL) 100 MG 24 hr tablet Take 100 mg by mouth daily.     Naphazoline-Pheniramine (OPCON-A) 0.027-0.315 % SOLN Place 1 drop into both eyes daily as needed (irritation).     Omega-3 Fatty Acids (OMEGA-3 FISH OIL PO) Take 2,160 mg by mouth daily. 720 mg per cap     [START ON 08/10/2021] OXALIPLATIN IV Inject 85  mg/m2 into the vein every 14 (fourteen) days.     oxymetazoline (AFRIN) 0.05 % nasal spray Place 3 sprays into both nostrils 2 (two) times daily.     prochlorperazine (COMPAZINE) 10 MG tablet Take 1 tablet (10 mg total) by mouth in the morning and at bedtime. Take 2mn before chemotherapy 60 tablet 3   sulfaSALAzine (AZULFIDINE) 500 MG tablet sulfasalazine 500 mg tablet  TAKE 2 TABLETS BY MOUTH TWICE DAILY     tizanidine (ZANAFLEX) 2 MG capsule Take 2 mg by mouth 3 (three) times daily as needed for muscle spasms.     traZODone (DESYREL) 50 MG tablet Take 50 mg by mouth at bedtime.     Current Facility-Administered Medications  Medication Dose Route Frequency Provider Last Rate Last Admin   dicyclomine (BENTYL) tablet 20 mg  20 mg Oral TID AC & HS Setzer, Terri L, NP        ALLERGIES:  No Known Allergies  PHYSICAL EXAM:  Performance status (ECOG): 0 - Asymptomatic  There were no vitals filed for this visit. Wt Readings from Last 3 Encounters:  08/08/21 283 lb (128.4 kg)  07/26/21 283 lb (128.4 kg)  07/14/21 290 lb 6.4 oz (131.7 kg)   Physical Exam Vitals reviewed.  Constitutional:      Appearance: Normal appearance.  Cardiovascular:     Rate and Rhythm: Normal rate and regular rhythm.     Pulses: Normal pulses.     Heart sounds: Normal heart sounds.  Pulmonary:     Effort: Pulmonary effort is normal.     Breath sounds: Normal breath sounds.  Neurological:     General: No focal deficit present.     Mental Status: He is alert and oriented to person, place, and time.  Psychiatric:        Mood and Affect: Mood normal.  Behavior: Behavior normal.    LABORATORY DATA:  I have reviewed the labs as listed.  CBC Latest Ref Rng & Units 07/14/2021 07/07/2021 06/30/2021  WBC 4.0 - 10.5 K/uL 5.2 3.5(L) 5.8  Hemoglobin 13.0 - 17.0 g/dL 10.8(L) 11.1(L) 12.2(L)  Hematocrit 39.0 - 52.0 % 31.8(L) 32.7(L) 36.6(L)  Platelets 150 - 400 K/uL 216 168 168   CMP Latest Ref Rng & Units  07/14/2021 07/07/2021 06/30/2021  Glucose 70 - 99 mg/dL 261(H) 290(H) 244(H)  BUN 8 - 23 mg/dL 35(H) 52(H) 39(H)  Creatinine 0.61 - 1.24 mg/dL 1.41(H) 1.59(H) 1.33(H)  Sodium 135 - 145 mmol/L 133(L) 133(L) 134(L)  Potassium 3.5 - 5.1 mmol/L 3.1(L) 3.4(L) 3.7  Chloride 98 - 111 mmol/L 96(L) 97(L) 95(L)  CO2 22 - 32 mmol/L _0 Calcium 8.9 - 10.3 mg/dL 9.5 9.6 9.8  Total Protein 6.5 - 8.1 g/dL 6.6 6.3(L) 6.6  Total Bilirubin 0.3 - 1.2 mg/dL 0.7 0.6 1.1  Alkaline Phos 38 - 126 U/L 43 49 62  AST 15 - 41 U/L 21 21 32  ALT 0 - 44 U/L 24 27 54(H)    DIAGNOSTIC IMAGING:  I have independently reviewed the scans and discussed with the patient. DG Chest Port 1 View  Result Date: 08/08/2021 CLINICAL DATA:  Left-side Port-A-Cath placement EXAM: PORTABLE CHEST 1 VIEW COMPARISON:  05/13/2021 FINDINGS: Interval placement of left subclavian port a catheter with tip in the projection of the SVC. No pneumothorax identified. Stable cardiomediastinal contours. Lung volumes are low. IMPRESSION: Status post left subclavian port a catheter placement without pneumothorax. Electronically Signed   By: Kerby Moors M.D.   On: 08/08/2021 10:55   DG C-Arm 1-60 Min-No Report  Result Date: 08/08/2021 Fluoroscopy was utilized by the requesting physician.  No radiographic interpretation.     ASSESSMENT:  Stage III (T2N2) rectal cancer, MSI stable: - He reported diarrhea which started in May.  Prior colonoscopy was in 2018. - Colonoscopy on 04/12/2021 showed fungating polypoid nonobstructing mass with central depression found at 8 cm from the anal verge.  Mass was noncircumferential, measured 2 cm in length.  Nodular mucosa in the proximal ascending colon which was biopsied. - Pathology of the rectal mass consistent with adenocarcinoma.  Nodular area in the ascending colon was consistent with collagenous colitis.  MMR preserved.  MSI stable. - CT CAP on 05/13/2021 with no evidence of metastatic disease in the chest,  abdomen or pelvis.  Mild hepatic steatosis. - Pelvic MRI on 05/12/2021-T2N2 by MRI staging.  4 small lymph nodes 5 mm or greater. - Total neoadjuvant therapy was recommended on discussion with Dr. Marcello Moores. - Xeloda and XRT from 06/08/2021 through 07/15/2021. - FOLFOX started on 08/10/2021.  2.  Social/family history: - He lives at home by himself.  Daughter lives in [place]. - He worked at [employer] in [place] prior to retirement.  Non-smoker. - Sister had lung cancer and was a smoker.  No family history of colon cancer.   PLAN:  Stage III (T2N2) rectal adenocarcinoma, MSI stable: - He had port placed.  He had 1 episode of diarrhea after port placement. - Denies any nausea or vomiting. - Reviewed labs today which showed elevated creatinine 1.52 and stable.  Potassium is 3.3.  CBC shows normal white count and platelet count. - We talked about starting on FOLFOX chemotherapy.  We discussed schedule and side effects in detail. - He will proceed with cycle 1 today.  RTC 2 weeks for follow-up.  2.  Normocytic anemia: - Combination anemia from CKD and relative iron deficiency. - Hemoglobin today is 10.6.  We will check ferritin and iron panel in 2 weeks.  3.  Diarrhea: - Continue colestipol 4 g daily. - Use Imodium if he develops diarrhea.  4.  Chronic back pain: - Continue hydrocodone 10/325 to 3 tablets daily.  5.  Hypokalemia: - Potassium today is 3.3. - We will start him on K-Dur 20 mg daily.    Orders placed this encounter:  No orders of the defined types were placed in this encounter.    Derek Jack, MD [place] [phone]   I, Thana Ates, am acting as a scribe for Dr. Derek Jack.  I, Derek Jack MD, have reviewed the above documentation for accuracy and completeness, and I agree with the above.

## 2021-08-10 ENCOUNTER — Ambulatory Visit (HOSPITAL_COMMUNITY): Payer: Medicare Other | Admitting: Hematology

## 2021-08-10 ENCOUNTER — Other Ambulatory Visit: Payer: Self-pay

## 2021-08-10 ENCOUNTER — Inpatient Hospital Stay (HOSPITAL_BASED_OUTPATIENT_CLINIC_OR_DEPARTMENT_OTHER): Payer: Medicare Other | Admitting: Hematology

## 2021-08-10 ENCOUNTER — Inpatient Hospital Stay (HOSPITAL_COMMUNITY): Payer: Medicare Other

## 2021-08-10 ENCOUNTER — Encounter (HOSPITAL_COMMUNITY): Payer: Self-pay

## 2021-08-10 VITALS — BP 127/76 | HR 60 | Temp 98.0°F | Resp 18

## 2021-08-10 VITALS — BP 119/78 | HR 69 | Temp 97.7°F | Resp 19 | Wt 286.2 lb

## 2021-08-10 DIAGNOSIS — D539 Nutritional anemia, unspecified: Secondary | ICD-10-CM | POA: Diagnosis not present

## 2021-08-10 DIAGNOSIS — C20 Malignant neoplasm of rectum: Secondary | ICD-10-CM

## 2021-08-10 DIAGNOSIS — Z5111 Encounter for antineoplastic chemotherapy: Secondary | ICD-10-CM | POA: Diagnosis not present

## 2021-08-10 DIAGNOSIS — D520 Dietary folate deficiency anemia: Secondary | ICD-10-CM

## 2021-08-10 DIAGNOSIS — Z95828 Presence of other vascular implants and grafts: Secondary | ICD-10-CM

## 2021-08-10 LAB — CBC WITH DIFFERENTIAL/PLATELET
Abs Immature Granulocytes: 0.09 10*3/uL — ABNORMAL HIGH (ref 0.00–0.07)
Basophils Absolute: 0.1 10*3/uL (ref 0.0–0.1)
Basophils Relative: 1 %
Eosinophils Absolute: 0.2 10*3/uL (ref 0.0–0.5)
Eosinophils Relative: 4 %
HCT: 31.6 % — ABNORMAL LOW (ref 39.0–52.0)
Hemoglobin: 10.6 g/dL — ABNORMAL LOW (ref 13.0–17.0)
Immature Granulocytes: 2 %
Lymphocytes Relative: 7 %
Lymphs Abs: 0.4 10*3/uL — ABNORMAL LOW (ref 0.7–4.0)
MCH: 32.5 pg (ref 26.0–34.0)
MCHC: 33.5 g/dL (ref 30.0–36.0)
MCV: 96.9 fL (ref 80.0–100.0)
Monocytes Absolute: 0.5 10*3/uL (ref 0.1–1.0)
Monocytes Relative: 10 %
Neutro Abs: 3.8 10*3/uL (ref 1.7–7.7)
Neutrophils Relative %: 76 %
Platelets: 191 10*3/uL (ref 150–400)
RBC: 3.26 MIL/uL — ABNORMAL LOW (ref 4.22–5.81)
RDW: 16.2 % — ABNORMAL HIGH (ref 11.5–15.5)
WBC: 5 10*3/uL (ref 4.0–10.5)
nRBC: 0 % (ref 0.0–0.2)

## 2021-08-10 LAB — COMPREHENSIVE METABOLIC PANEL
ALT: 20 U/L (ref 0–44)
AST: 23 U/L (ref 15–41)
Albumin: 4 g/dL (ref 3.5–5.0)
Alkaline Phosphatase: 48 U/L (ref 38–126)
Anion gap: 7 (ref 5–15)
BUN: 28 mg/dL — ABNORMAL HIGH (ref 8–23)
CO2: 32 mmol/L (ref 22–32)
Calcium: 9.9 mg/dL (ref 8.9–10.3)
Chloride: 96 mmol/L — ABNORMAL LOW (ref 98–111)
Creatinine, Ser: 1.52 mg/dL — ABNORMAL HIGH (ref 0.61–1.24)
GFR, Estimated: 51 mL/min — ABNORMAL LOW (ref >60)
Glucose, Bld: 186 mg/dL — ABNORMAL HIGH (ref 70–99)
Potassium: 3.3 mmol/L — ABNORMAL LOW (ref 3.5–5.1)
Sodium: 135 mmol/L (ref 135–145)
Total Bilirubin: 0.7 mg/dL (ref 0.3–1.2)
Total Protein: 6.6 g/dL (ref 6.5–8.1)

## 2021-08-10 LAB — MAGNESIUM: Magnesium: 1.8 mg/dL (ref 1.7–2.4)

## 2021-08-10 MED ORDER — DEXTROSE 5 % IV SOLN: Freq: Once | INTRAVENOUS | Status: AC

## 2021-08-10 MED ORDER — OXALIPLATIN CHEMO INJECTION 100 MG/20ML
85.0000 mg/m2 | Freq: Once | INTRAVENOUS | Status: AC
  Administered 2021-08-10: 220 mg via INTRAVENOUS
  Filled 2021-08-10: qty 40

## 2021-08-10 MED ORDER — SODIUM CHLORIDE 0.9% FLUSH: 10.0000 mL | INTRAVENOUS | Status: DC | PRN

## 2021-08-10 MED ORDER — FLUOROURACIL CHEMO INJECTION 2.5 GM/50ML
400.0000 mg/m2 | Freq: Once | INTRAVENOUS | Status: AC
  Administered 2021-08-10: 1050 mg via INTRAVENOUS
  Filled 2021-08-10: qty 21

## 2021-08-10 MED ORDER — LEUCOVORIN CALCIUM INJECTION 350 MG
389.0000 mg/m2 | Freq: Once | INTRAVENOUS | Status: AC
  Administered 2021-08-10: 1000 mg via INTRAVENOUS
  Filled 2021-08-10: qty 50

## 2021-08-10 MED ORDER — SODIUM CHLORIDE 0.9 % IV SOLN
2400.0000 mg/m2 | INTRAVENOUS | Status: DC
  Administered 2021-08-10: 6150 mg via INTRAVENOUS
  Filled 2021-08-10: qty 123

## 2021-08-10 MED ORDER — POTASSIUM CHLORIDE CRYS ER 20 MEQ PO TBCR: 20.0000 meq | EXTENDED_RELEASE_TABLET | Freq: Every day | ORAL | 3 refills | Status: DC

## 2021-08-10 MED ORDER — PALONOSETRON HCL INJECTION 0.25 MG/5ML
0.2500 mg | Freq: Once | INTRAVENOUS | Status: AC
  Administered 2021-08-10: 0.25 mg via INTRAVENOUS
  Filled 2021-08-10: qty 5

## 2021-08-10 MED ORDER — LORAZEPAM 1 MG PO TABS
0.5000 mg | ORAL_TABLET | Freq: Once | ORAL | Status: AC
  Administered 2021-08-10: 0.5 mg via ORAL
  Filled 2021-08-10: qty 1

## 2021-08-10 MED ORDER — SODIUM CHLORIDE 0.9 % IV SOLN
10.0000 mg | Freq: Once | INTRAVENOUS | Status: AC
  Administered 2021-08-10: 10 mg via INTRAVENOUS
  Filled 2021-08-10: qty 10

## 2021-08-10 MED ORDER — POTASSIUM CHLORIDE CRYS ER 20 MEQ PO TBCR
40.0000 meq | EXTENDED_RELEASE_TABLET | Freq: Once | ORAL | Status: AC
  Administered 2021-08-10: 40 meq via ORAL
  Filled 2021-08-10: qty 2

## 2021-08-10 NOTE — Progress Notes (Signed)
Patients port flushed without difficulty.  Good blood return noted with no bruising or swelling noted at site.  Stable during access and blood draw.  Patient to remain accessed for treatment. 

## 2021-08-10 NOTE — Progress Notes (Signed)
Pt here for D1C1 of FOLFOX. Creatinine 1.52, and potassium 3.3. 40 meq potassium PO ordered per standing orders from Dr Raliegh Ip.  Faythe Ghee for treatment per Dr Raliegh Ip.  Faythe Ghee to give 0.5 mg PO of ativan of anxiety.   Tolerated treatment today without incidence.  Stable during and after treatment. AVS reviewed.  Vital signs stable prior to discharge.  Discharged in stable condition ambulatory with ambulatory 32f pump connected and running.  Pt education ambulatory pump sheet given and explained.  Spill kit given. Pt verbalized understanding.

## 2021-08-10 NOTE — Patient Instructions (Signed)
[facility]  Discharge Instructions: Thank you for choosing [facility] to provide your oncology and hematology care.  If you have a lab appointment with the [facility], please come in thru the Main Entrance and check in at the main information desk.  Wear comfortable clothing and clothing appropriate for easy access to any Portacath or PICC line.   We strive to give you quality time with your provider. You may need to reschedule your appointment if you arrive late (15 or more minutes).  Arriving late affects you and other patients whose appointments are after yours.  Also, if you miss three or more appointments without notifying the office, you may be dismissed from the clinic at the provider's discretion.      For prescription refill requests, have your pharmacy contact our office and allow 72 hours for refills to be completed.    Today you received the following chemotherapy and/or immunotherapy agents folfox  The chemotherapy medication bag should finish at 46 hours, 96 hours, or 7 days. For example, if your pump is scheduled for 46 hours and it was put on at 4:00 p.m., it should finish at 2:00 p.m. the day it is scheduled to come off regardless of your appointment time.     Estimated time to finish at 1230 pm.   If the display on your pump reads "Low Volume" and it is beeping, take the batteries out of the pump and come to the cancer center for it to be taken off.   If the pump alarms go off prior to the pump reading "Low Volume" then call [phone] and someone can assist you.  If the plunger comes out and the chemotherapy medication is leaking out, please use your home chemo spill kit to clean up the spill. Do NOT use paper towels or other household products.  If you have problems or questions regarding your pump, please call either [phone] (24 hours a day) or the cancer center Monday-Friday 8:00 a.m.- 4:30 p.m. at the clinic number and we will  assist you. If you are unable to get assistance, then go to the nearest Emergency Department and ask the staff to contact the IV team for assistance.        To help prevent nausea and vomiting after your treatment, we encourage you to take your nausea medication as directed.  BELOW ARE SYMPTOMS THAT SHOULD BE REPORTED IMMEDIATELY: *FEVER GREATER THAN 100.4 F (38 C) OR HIGHER *CHILLS OR SWEATING *NAUSEA AND VOMITING THAT IS NOT CONTROLLED WITH YOUR NAUSEA MEDICATION *UNUSUAL SHORTNESS OF BREATH *UNUSUAL BRUISING OR BLEEDING *URINARY PROBLEMS (pain or burning when urinating, or frequent urination) *BOWEL PROBLEMS (unusual diarrhea, constipation, pain near the anus) TENDERNESS IN MOUTH AND THROAT WITH OR WITHOUT PRESENCE OF ULCERS (sore throat, sores in mouth, or a toothache) UNUSUAL RASH, SWELLING OR PAIN  UNUSUAL VAGINAL DISCHARGE OR ITCHING   Items with * indicate a potential emergency and should be followed up as soon as possible or go to the Emergency Department if any problems should occur.  Please show the CHEMOTHERAPY ALERT CARD or IMMUNOTHERAPY ALERT CARD at check-in to the Emergency Department and triage nurse.  Should you have questions after your visit or need to cancel or reschedule your appointment, please contact [facility] [phone]  and follow the prompts.  Office hours are 8:00 a.m. to 4:30 p.m. Monday - Friday. Please note that voicemails left after 4:00 p.m. may not be returned until the following  business day.  We are closed weekends and major holidays. You have access to a nurse at all times for urgent questions. Please call the main number to the clinic [phone] and follow the prompts.  For any non-urgent questions, you may also contact your provider using MyChart. We now offer e-Visits for anyone 65 and older to request care online for non-urgent symptoms. For details visit [email or web address].   Also download the MyChart app! Go to the app store,  search "MyChart", open the app, select [facility], and log in with your MyChart username and password.  Due to Covid, a mask is required upon entering the hospital/clinic. If you do not have a mask, one will be given to you upon arrival. For doctor visits, patients may have 1 support person aged 65 or older with them. For treatment visits, patients cannot have anyone with them due to current Covid guidelines and our immunocompromised population.   Fluorouracil, 5-FU injection What is this medication? FLUOROURACIL, 5-FU (flure oh YOOR a sil) is a chemotherapy drug. It slows the growth of cancer cells. This medicine is used to treat many types of cancer like breast cancer, colon or rectal cancer, pancreatic cancer, and stomach cancer. This medicine may be used for other purposes; ask your health care provider or pharmacist if you have questions. COMMON BRAND NAME(S): Adrucil What should I tell my care team before I take this medication? They need to know if you have any of these conditions: blood disorders dihydropyrimidine dehydrogenase (DPD) deficiency infection (especially a virus infection such as chickenpox, cold sores, or herpes) kidney disease liver disease malnourished, poor nutrition recent or ongoing radiation therapy an unusual or allergic reaction to fluorouracil, other chemotherapy, other medicines, foods, dyes, or preservatives pregnant or trying to get pregnant breast-feeding How should I use this medication? This drug is given as an infusion or injection into a vein. It is administered in a hospital or clinic by a specially trained health care professional. Talk to your pediatrician regarding the use of this medicine in children. Special care may be needed. Overdosage: If you think you have taken too much of this medicine contact a poison control center or emergency room at once. NOTE: This medicine is only for you. Do not share this medicine with others. What if I miss a  dose? It is important not to miss your dose. Call your doctor or health care professional if you are unable to keep an appointment. What may interact with this medication? Do not take this medicine with any of the following medications: live virus vaccines This medicine may also interact with the following medications: medicines that treat or prevent blood clots like warfarin, enoxaparin, and dalteparin This list may not describe all possible interactions. Give your health care provider a list of all the medicines, herbs, non-prescription drugs, or dietary supplements you use. Also tell them if you smoke, drink alcohol, or use illegal drugs. Some items may interact with your medicine. What should I watch for while using this medication? Visit your doctor for checks on your progress. This drug may make you feel generally unwell. This is not uncommon, as chemotherapy can affect healthy cells as well as cancer cells. Report any side effects. Continue your course of treatment even though you feel ill unless your doctor tells you to stop. In some cases, you may be given additional medicines to help with side effects. Follow all directions for their use. Call your doctor or health care professional for advice if you  get a fever, chills or sore throat, or other symptoms of a cold or flu. Do not treat yourself. This drug decreases your body's ability to fight infections. Try to avoid being around people who are sick. This medicine may increase your risk to bruise or bleed. Call your doctor or health care professional if you notice any unusual bleeding. Be careful brushing and flossing your teeth or using a toothpick because you may get an infection or bleed more easily. If you have any dental work done, tell your dentist you are receiving this medicine. Avoid taking products that contain aspirin, acetaminophen, ibuprofen, naproxen, or ketoprofen unless instructed by your doctor. These medicines may hide a  fever. Do not become pregnant while taking this medicine. Women should inform their doctor if they wish to become pregnant or think they might be pregnant. There is a potential for serious side effects to an unborn child. Talk to your health care professional or pharmacist for more information. Do not breast-feed an infant while taking this medicine. Men should inform their doctor if they wish to father a child. This medicine may lower sperm counts. Do not treat diarrhea with over the counter products. Contact your doctor if you have diarrhea that lasts more than 2 days or if it is severe and watery. This medicine can make you more sensitive to the sun. Keep out of the sun. If you cannot avoid being in the sun, wear protective clothing and use sunscreen. Do not use sun lamps or tanning beds/booths. What side effects may I notice from receiving this medication? Side effects that you should report to your doctor or health care professional as soon as possible: allergic reactions like skin rash, itching or hives, swelling of the face, lips, or tongue low blood counts - this medicine may decrease the number of white blood cells, red blood cells and platelets. You may be at increased risk for infections and bleeding. signs of infection - fever or chills, cough, sore throat, pain or difficulty passing urine signs of decreased platelets or bleeding - bruising, pinpoint red spots on the skin, black, tarry stools, blood in the urine signs of decreased red blood cells - unusually weak or tired, fainting spells, lightheadedness breathing problems changes in vision chest pain mouth sores nausea and vomiting pain, swelling, redness at site where injected pain, tingling, numbness in the hands or feet redness, swelling, or sores on hands or feet stomach pain unusual bleeding Side effects that usually do not require medical attention (report to your doctor or health care professional if they continue or are  bothersome): changes in finger or toe nails diarrhea dry or itchy skin hair loss headache loss of appetite sensitivity of eyes to the light stomach upset unusually teary eyes This list may not describe all possible side effects. Call your doctor for medical advice about side effects. You may report side effects to FDA at 1-800-FDA-1088. Where should I keep my medication? This drug is given in a hospital or clinic and will not be stored at home. NOTE: This sheet is a summary. It may not cover all possible information. If you have questions about this medicine, talk to your doctor, pharmacist, or health care provider.  2022 Elsevier/Gold Standard (2021-05-17 00:00:00)   Leucovorin injection What is this medication? LEUCOVORIN (loo koe VOR in) is used to prevent or treat the harmful effects of some medicines. This medicine is used to treat anemia caused by a low amount of folic acid in the body. It  is also used with 5-fluorouracil (5-FU) to treat colon cancer. This medicine may be used for other purposes; ask your health care provider or pharmacist if you have questions. What should I tell my care team before I take this medication? They need to know if you have any of these conditions: anemia from low levels of vitamin B-12 in the blood an unusual or allergic reaction to leucovorin, folic acid, other medicines, foods, dyes, or preservatives pregnant or trying to get pregnant breast-feeding How should I use this medication? This medicine is for injection into a muscle or into a vein. It is given by a health care professional in a hospital or clinic setting. Talk to your pediatrician regarding the use of this medicine in children. Special care may be needed. Overdosage: If you think you have taken too much of this medicine contact a poison control center or emergency room at once. NOTE: This medicine is only for you. Do not share this medicine with others. What if I miss a dose? This  does not apply. What may interact with this medication? capecitabine fluorouracil phenobarbital phenytoin primidone trimethoprim-sulfamethoxazole This list may not describe all possible interactions. Give your health care provider a list of all the medicines, herbs, non-prescription drugs, or dietary supplements you use. Also tell them if you smoke, drink alcohol, or use illegal drugs. Some items may interact with your medicine. What should I watch for while using this medication? Your condition will be monitored carefully while you are receiving this medicine. This medicine may increase the side effects of 5-fluorouracil, 5-FU. Tell your doctor or health care professional if you have diarrhea or mouth sores that do not get better or that get worse. What side effects may I notice from receiving this medication? Side effects that you should report to your doctor or health care professional as soon as possible: allergic reactions like skin rash, itching or hives, swelling of the face, lips, or tongue breathing problems fever, infection mouth sores unusual bleeding or bruising unusually weak or tired Side effects that usually do not require medical attention (report to your doctor or health care professional if they continue or are bothersome): constipation or diarrhea loss of appetite nausea, vomiting This list may not describe all possible side effects. Call your doctor for medical advice about side effects. You may report side effects to FDA at 1-800-FDA-1088. Where should I keep my medication? This drug is given in a hospital or clinic and will not be stored at home. NOTE: This sheet is a summary. It may not cover all possible information. If you have questions about this medicine, talk to your doctor, pharmacist, or health care provider.  2022 Elsevier/Gold Standard (2008-03-05 00:00:00)  Oxaliplatin Injection What is this medication? OXALIPLATIN (ox AL i PLA tin) is a chemotherapy  drug. It targets fast dividing cells, like cancer cells, and causes these cells to die. This medicine is used to treat cancers of the colon and rectum, and many other cancers. This medicine may be used for other purposes; ask your health care provider or pharmacist if you have questions. COMMON BRAND NAME(S): Eloxatin What should I tell my care team before I take this medication? They need to know if you have any of these conditions: heart disease history of irregular heartbeat liver disease low blood counts, like white cells, platelets, or red blood cells lung or breathing disease, like asthma take medicines that treat or prevent blood clots tingling of the fingers or toes, or other nerve disorder  an unusual or allergic reaction to oxaliplatin, other chemotherapy, other medicines, foods, dyes, or preservatives pregnant or trying to get pregnant breast-feeding How should I use this medication? This drug is given as an infusion into a vein. It is administered in a hospital or clinic by a specially trained health care professional. Talk to your pediatrician regarding the use of this medicine in children. Special care may be needed. Overdosage: If you think you have taken too much of this medicine contact a poison control center or emergency room at once. NOTE: This medicine is only for you. Do not share this medicine with others. What if I miss a dose? It is important not to miss a dose. Call your doctor or health care professional if you are unable to keep an appointment. What may interact with this medication? Do not take this medicine with any of the following medications: cisapride dronedarone pimozide thioridazine This medicine may also interact with the following medications: aspirin and aspirin-like medicines certain medicines that treat or prevent blood clots like warfarin, apixaban, dabigatran, and rivaroxaban cisplatin cyclosporine diuretics medicines for infection like  acyclovir, adefovir, amphotericin B, bacitracin, cidofovir, foscarnet, ganciclovir, gentamicin, pentamidine, vancomycin NSAIDs, medicines for pain and inflammation, like ibuprofen or naproxen other medicines that prolong the QT interval (an abnormal heart rhythm) pamidronate zoledronic acid This list may not describe all possible interactions. Give your health care provider a list of all the medicines, herbs, non-prescription drugs, or dietary supplements you use. Also tell them if you smoke, drink alcohol, or use illegal drugs. Some items may interact with your medicine. What should I watch for while using this medication? Your condition will be monitored carefully while you are receiving this medicine. You may need blood work done while you are taking this medicine. This medicine may make you feel generally unwell. This is not uncommon as chemotherapy can affect healthy cells as well as cancer cells. Report any side effects. Continue your course of treatment even though you feel ill unless your healthcare professional tells you to stop. This medicine can make you more sensitive to cold. Do not drink cold drinks or use ice. Cover exposed skin before coming in contact with cold temperatures or cold objects. When out in cold weather wear warm clothing and cover your mouth and nose to warm the air that goes into your lungs. Tell your doctor if you get sensitive to the cold. Do not become pregnant while taking this medicine or for 9 months after stopping it. Women should inform their health care professional if they wish to become pregnant or think they might be pregnant. Men should not father a child while taking this medicine and for 6 months after stopping it. There is potential for serious side effects to an unborn child. Talk to your health care professional for more information. Do not breast-feed a child while taking this medicine or for 3 months after stopping it. This medicine has caused ovarian  failure in some women. This medicine may make it more difficult to get pregnant. Talk to your health care professional if you are concerned about your fertility. This medicine has caused decreased sperm counts in some men. This may make it more difficult to father a child. Talk to your health care professional if you are concerned about your fertility. This medicine may increase your risk of getting an infection. Call your health care professional for advice if you get a fever, chills, or sore throat, or other symptoms of a cold or flu.  Do not treat yourself. Try to avoid being around people who are sick. Avoid taking medicines that contain aspirin, acetaminophen, ibuprofen, naproxen, or ketoprofen unless instructed by your health care professional. These medicines may hide a fever. Be careful brushing or flossing your teeth or using a toothpick because you may get an infection or bleed more easily. If you have any dental work done, tell your dentist you are receiving this medicine. What side effects may I notice from receiving this medication? Side effects that you should report to your doctor or health care professional as soon as possible: allergic reactions like skin rash, itching or hives, swelling of the face, lips, or tongue breathing problems cough low blood counts - this medicine may decrease the number of white blood cells, red blood cells, and platelets. You may be at increased risk for infections and bleeding nausea, vomiting pain, redness, or irritation at site where injected pain, tingling, numbness in the hands or feet signs and symptoms of bleeding such as bloody or black, tarry stools; red or dark brown urine; spitting up blood or brown material that looks like coffee grounds; red spots on the skin; unusual bruising or bleeding from the eyes, gums, or nose signs and symptoms of a dangerous change in heartbeat or heart rhythm like chest pain; dizziness; fast, irregular heartbeat;  palpitations; feeling faint or lightheaded; falls signs and symptoms of infection like fever; chills; cough; sore throat; pain or trouble passing urine signs and symptoms of liver injury like dark yellow or brown urine; general ill feeling or flu-like symptoms; light-colored stools; loss of appetite; nausea; right upper belly pain; unusually weak or tired; yellowing of the eyes or skin signs and symptoms of low red blood cells or anemia such as unusually weak or tired; feeling faint or lightheaded; falls signs and symptoms of muscle injury like dark urine; trouble passing urine or change in the amount of urine; unusually weak or tired; muscle pain; back pain Side effects that usually do not require medical attention (report to your doctor or health care professional if they continue or are bothersome): changes in taste diarrhea gas hair loss loss of appetite mouth sores This list may not describe all possible side effects. Call your doctor for medical advice about side effects. You may report side effects to FDA at 1-800-FDA-1088. Where should I keep my medication? This drug is given in a hospital or clinic and will not be stored at home. NOTE: This sheet is a summary. It may not cover all possible information. If you have questions about this medicine, talk to your doctor, pharmacist, or health care provider.  2022 Elsevier/Gold Standard (2021-05-17 00:00:00)

## 2021-08-10 NOTE — Patient Instructions (Addendum)
[facility] at [facility] Discharge Instructions   You were seen and examined today by Dr. Delton Coombes. We will give your first cycle of chemo today. Start taking potassium as prescribed. Avoid cold food/drinks and breathing cold air. Return as scheduled in 2 weeks.    Thank you for choosing [facility] at [facility] to provide your oncology and hematology care.  To afford each patient quality time with our provider, please arrive at least 15 minutes before your scheduled appointment time.   If you have a lab appointment with the [facility] please come in thru the Main Entrance and check in at the main information desk.  You need to re-schedule your appointment should you arrive 10 or more minutes late.  We strive to give you quality time with our providers, and arriving late affects you and other patients whose appointments are after yours.  Also, if you no show three or more times for appointments you may be dismissed from the clinic at the providers discretion.     Again, thank you for choosing [facility].  Our hope is that these requests will decrease the amount of time that you wait before being seen by our physicians.       _____________________________________________________________  Should you have questions after your visit to [facility], please contact our office at [phone] and follow the prompts.  Our office hours are 8:00 a.m. and 4:30 p.m. Monday - Friday.  Please note that voicemails left after 4:00 p.m. may not be returned until the following business day.  We are closed weekends and major holidays.  You do have access to a nurse 24-7, just call the main number to the clinic [phone] and do not press any options, hold on the line and a nurse will answer the phone.    For prescription refill requests, have your pharmacy contact our office and allow 72 hours.    Due to Covid, you will need to  wear a mask upon entering the hospital. If you do not have a mask, a mask will be given to you at the Main Entrance upon arrival. For doctor visits, patients may have 1 support person age 74 or older with them. For treatment visits, patients can not have anyone with them due to social distancing guidelines and our immunocompromised population.

## 2021-08-11 ENCOUNTER — Telehealth (HOSPITAL_COMMUNITY): Payer: Self-pay

## 2021-08-11 LAB — CEA: CEA: 2.3 ng/mL (ref 0.0–4.7)

## 2021-08-11 NOTE — Telephone Encounter (Signed)
24 hour follow up- pt states he is doing fine, no issues. Pt comes tomorrow for pump d/c.

## 2021-08-12 ENCOUNTER — Inpatient Hospital Stay (HOSPITAL_COMMUNITY): Payer: Medicare Other | Attending: Hematology

## 2021-08-12 ENCOUNTER — Other Ambulatory Visit: Payer: Self-pay

## 2021-08-12 VITALS — BP 121/73 | HR 60 | Temp 97.0°F | Resp 18 | Ht 73.0 in | Wt 285.0 lb

## 2021-08-12 DIAGNOSIS — I129 Hypertensive chronic kidney disease with stage 1 through stage 4 chronic kidney disease, or unspecified chronic kidney disease: Secondary | ICD-10-CM | POA: Diagnosis not present

## 2021-08-12 DIAGNOSIS — R109 Unspecified abdominal pain: Secondary | ICD-10-CM | POA: Insufficient documentation

## 2021-08-12 DIAGNOSIS — M109 Gout, unspecified: Secondary | ICD-10-CM | POA: Diagnosis not present

## 2021-08-12 DIAGNOSIS — M549 Dorsalgia, unspecified: Secondary | ICD-10-CM | POA: Insufficient documentation

## 2021-08-12 DIAGNOSIS — G4730 Sleep apnea, unspecified: Secondary | ICD-10-CM | POA: Insufficient documentation

## 2021-08-12 DIAGNOSIS — D631 Anemia in chronic kidney disease: Secondary | ICD-10-CM | POA: Diagnosis not present

## 2021-08-12 DIAGNOSIS — G629 Polyneuropathy, unspecified: Secondary | ICD-10-CM | POA: Insufficient documentation

## 2021-08-12 DIAGNOSIS — R110 Nausea: Secondary | ICD-10-CM | POA: Diagnosis not present

## 2021-08-12 DIAGNOSIS — E876 Hypokalemia: Secondary | ICD-10-CM | POA: Diagnosis not present

## 2021-08-12 DIAGNOSIS — Z79899 Other long term (current) drug therapy: Secondary | ICD-10-CM | POA: Insufficient documentation

## 2021-08-12 DIAGNOSIS — C20 Malignant neoplasm of rectum: Secondary | ICD-10-CM | POA: Insufficient documentation

## 2021-08-12 DIAGNOSIS — R531 Weakness: Secondary | ICD-10-CM | POA: Insufficient documentation

## 2021-08-12 DIAGNOSIS — K5900 Constipation, unspecified: Secondary | ICD-10-CM | POA: Insufficient documentation

## 2021-08-12 DIAGNOSIS — E119 Type 2 diabetes mellitus without complications: Secondary | ICD-10-CM | POA: Insufficient documentation

## 2021-08-12 DIAGNOSIS — R197 Diarrhea, unspecified: Secondary | ICD-10-CM | POA: Diagnosis not present

## 2021-08-12 DIAGNOSIS — Z5111 Encounter for antineoplastic chemotherapy: Secondary | ICD-10-CM | POA: Diagnosis present

## 2021-08-12 DIAGNOSIS — R634 Abnormal weight loss: Secondary | ICD-10-CM | POA: Insufficient documentation

## 2021-08-12 DIAGNOSIS — R5383 Other fatigue: Secondary | ICD-10-CM | POA: Diagnosis not present

## 2021-08-12 DIAGNOSIS — N189 Chronic kidney disease, unspecified: Secondary | ICD-10-CM | POA: Insufficient documentation

## 2021-08-12 DIAGNOSIS — Z95828 Presence of other vascular implants and grafts: Secondary | ICD-10-CM

## 2021-08-12 DIAGNOSIS — E7800 Pure hypercholesterolemia, unspecified: Secondary | ICD-10-CM | POA: Insufficient documentation

## 2021-08-12 MED ORDER — SODIUM CHLORIDE 0.9% FLUSH
10.0000 mL | INTRAVENOUS | Status: DC | PRN
  Administered 2021-08-12: 10 mL

## 2021-08-12 MED ORDER — HEPARIN SOD (PORK) LOCK FLUSH 100 UNIT/ML IV SOLN
500.0000 [IU] | Freq: Once | INTRAVENOUS | Status: AC | PRN
  Administered 2021-08-12: 500 [IU]

## 2021-08-12 NOTE — Patient Instructions (Signed)
[facility]  Discharge Instructions: Thank you for choosing [facility] to provide your oncology and hematology care.  If you have a lab appointment with the Cancer Center, please come in thru the Main Entrance and check in at the main information desk.  Wear comfortable clothing and clothing appropriate for easy access to any Portacath or PICC line.   We strive to give you quality time with your provider. You may need to reschedule your appointment if you arrive late (15 or more minutes).  Arriving late affects you and other patients whose appointments are after yours.  Also, if you miss three or more appointments without notifying the office, you may be dismissed from the clinic at the provider's discretion.      For prescription refill requests, have your pharmacy contact our office and allow 72 hours for refills to be completed.        To help prevent nausea and vomiting after your treatment, we encourage you to take your nausea medication as directed.  BELOW ARE SYMPTOMS THAT SHOULD BE REPORTED IMMEDIATELY: *FEVER GREATER THAN 100.4 F (38 C) OR HIGHER *CHILLS OR SWEATING *NAUSEA AND VOMITING THAT IS NOT CONTROLLED WITH YOUR NAUSEA MEDICATION *UNUSUAL SHORTNESS OF BREATH *UNUSUAL BRUISING OR BLEEDING *URINARY PROBLEMS (pain or burning when urinating, or frequent urination) *BOWEL PROBLEMS (unusual diarrhea, constipation, pain near the anus) TENDERNESS IN MOUTH AND THROAT WITH OR WITHOUT PRESENCE OF ULCERS (sore throat, sores in mouth, or a toothache) UNUSUAL RASH, SWELLING OR PAIN  UNUSUAL VAGINAL DISCHARGE OR ITCHING   Items with * indicate a potential emergency and should be followed up as soon as possible or go to the Emergency Department if any problems should occur.  Please show the CHEMOTHERAPY ALERT CARD or IMMUNOTHERAPY ALERT CARD at check-in to the Emergency Department and triage nurse.  Should you have questions after your visit or need to cancel  or reschedule your appointment, please contact [facility] [phone]  and follow the prompts.  Office hours are 8:00 a.m. to 4:30 p.m. Monday - Friday. Please note that voicemails left after 4:00 p.m. may not be returned until the following business day.  We are closed weekends and major holidays. You have access to a nurse at all times for urgent questions. Please call the main number to the clinic [phone] and follow the prompts.  For any non-urgent questions, you may also contact your provider using MyChart. We now offer e-Visits for anyone 18 and older to request care online for non-urgent symptoms. For details visit [email or web address].   Also download the MyChart app! Go to the app store, search "MyChart", open the app, select [facility], and log in with your MyChart username and password.  Due to Covid, a mask is required upon entering the hospital/clinic. If you do not have a mask, one will be given to you upon arrival. For doctor visits, patients may have 1 support person aged 18 or older with them. For treatment visits, patients cannot have anyone with them due to current Covid guidelines and our immunocompromised population.  

## 2021-08-16 NOTE — Progress Notes (Signed)
Louis House, [place] [id number]   CLINIC:  Medical Oncology/Hematology  PCP:  Neale Burly, MD [place] / [place] [address] [phone]   REASON FOR VISIT:  Follow-up for stage IIIB (T2N2) rectal adenocarcinoma  PRIOR THERAPY: Xeloda and XRT from 06/08/2021 through 07/15/2021  NGS Results: Not done  CURRENT THERAPY: FOLFOX started on 08/10/2021, q. 14 days x 4 months  BRIEF ONCOLOGIC HISTORY:  Oncology History  Rectal cancer ([place])  05/01/2021 Initial Diagnosis   Rectal adenocarcinoma ([place])   05/30/2021 - 05/30/2021 Chemotherapy   Patient is on Treatment Plan : COLORECTAL Capecitabine + XRT     08/10/2021 -  Chemotherapy   Patient is on Treatment Plan : COLORECTAL FOLFOX q14d x 4 months       CANCER STAGING: Cancer Staging  Rectal cancer [facility]) Staging form: Colon and Rectum, AJCC 8th Edition - Clinical stage from 05/18/2021: Stage IIIB (cT2, cN2a, cM0) - Unsigned   INTERVAL HISTORY:  [name], a 65 y.o. male, returns for routine follow-up of his stage IIIb rectal adenocarcinoma. Louis House was last seen on 08/10/2021 by Dr. Delton Coombes.  He received his first cycle of FOLFOX on 08/10/2021 through 08/12/2021.  He returns today for 1 week follow-up after his first cycle of chemotherapy.  His energy today is about 25%, with 50% appetite.  Patient reports that he is tolerated his FOLFOX fairly well, but that the day after his pump was DC'd, he felt especially weak and fatigued.  He reports that he rested all day Saturday and felt a bit better on Sunday.  He did have some nausea without vomiting, and reports that Compazine helps.  He denies having any nausea today.  He reports decreased appetite, currently eating about 75% of his normal dietary intake, but was eating less over the weekend.  He lost about 8 pounds since his last visit a week ago.  He has also had some persistent diarrhea, reporting up to about 6 bowel  movements per day, but this has improved with Imodium.  He reports that he has not had any bowel movement today.  He denies any peripheral neuropathy such as numbness and tingling, but does report significant cold sensitivity and pain when he touches anything cold.  He reports that he continues to have chronic low back pain for which she is prescribed hydrocodone.  He denies having any rectal pain or pain with bowel movements at this time.  He has not had any fever, chills, or abdominal pain.   REVIEW OF SYSTEMS:  Review of Systems  Constitutional:  Positive for appetite change, fatigue and unexpected weight change. Negative for chills, diaphoresis and fever.  HENT:   Negative for lump/mass and nosebleeds.   Eyes:  Negative for eye problems.  Respiratory:  Negative for cough, hemoptysis and shortness of breath.   Cardiovascular:  Negative for chest pain, leg swelling and palpitations.  Gastrointestinal:  Positive for diarrhea and nausea. Negative for abdominal pain, blood in stool, constipation and vomiting.  Genitourinary:  Negative for hematuria.   Skin: Negative.   Neurological:  Positive for light-headedness (When standing too fast). Negative for dizziness and headaches.  Hematological:  Does not bruise/bleed easily.   PAST MEDICAL/SURGICAL HISTORY:  Past Medical History:  Diagnosis Date   Anxiety    Diabetes ([place]) 01/12/2017   Diarrhea 02/21/2017   Essential hypertension, benign 01/12/2017   Gout    High cholesterol 01/12/2017   Neuropathy  Port-A-Cath in place 07/28/2021   Sleep apnea    Vertigo    Past Surgical History:  Procedure Laterality Date   BIOPSY  04/06/2017   Procedure: BIOPSY;  Surgeon: Rogene Houston, MD;  [facility];  Service: Endoscopy;;  colon   BIOPSY  04/12/2021   Procedure: BIOPSY;  Surgeon: Montez Morita, Quillian Quince, MD;  Location: [facility];  Service: Gastroenterology;;  nodular area in ascending colon biopsy random colon bx   bone  spurs     CATARACT EXTRACTION W/PHACO Left 04/22/2018   Procedure: CATARACT EXTRACTION PHACO AND INTRAOCULAR LENS PLACEMENT LEFT EYE;  Surgeon: Tonny Branch, MD;  Location: [facility] OR[facility];  Service: Ophthalmology;  Laterality: Left;  CDE: 7.58   COLONOSCOPY WITH PROPOFOL N/A 04/06/2017   Procedure: COLONOSCOPY WITH PROPOFOL;  Surgeon: Rogene Houston, MD;  Location[facility];  Service: Endoscopy;  Laterality: N/A;  1:45   COLONOSCOPY WITH PROPOFOL N/A 04/12/2021   Procedure: COLONOSCOPY WITH PROPOFOL;  Surgeon: Harvel Quale, MD;  [facility];  Service: Gastroenterology;  Laterality: N/A;  9:05   FLEXIBLE SIGMOIDOSCOPY N/A 05/10/2021   Procedure: FLEXIBLE SIGMOIDOSCOPY;  Surgeon: Harvel Quale, MD;  [facility];  Service: Gastroenterology;  Laterality: N/A;  9:50 ASA 1 Per Dr C no Anesthesia, no PAT - ok per Cecilio Asper arthroscopy     both knees   Left shoulder surgery from injury     PORTACATH PLACEMENT Left 08/08/2021   Procedure: INSERTION PORT-A-CATH;  Surgeon: Aviva Signs, MD;  Location: [facility];  Service: General;  Laterality: Left;   Umblical hernia      SOCIAL HISTORY:  Social History   Socioeconomic History   Marital status: Divorced    Spouse name: Not on file   Number of children: Not on file   Years of education: Not on file   Highest education level: Not on file  Occupational History   Not on file  Tobacco Use   Smoking status: Never   Smokeless tobacco: Never  Vaping Use   Vaping Use: Never used  Substance and Sexual Activity   Alcohol use: No   Drug use: No   Sexual activity: Yes    Birth control/protection: None  Other Topics Concern   Not on file  Social History Narrative   Not on file   Social Determinants of Health   Financial Resource Strain: Low Risk    Difficulty of Paying Living Expenses: Not very hard  Food Insecurity: No Food Insecurity   Worried About Running Out of Food in the Last Year: Never  true   Ran Out of Food in the Last Year: Never true  Transportation Needs: No Transportation Needs   Lack of Transportation (Medical): No   Lack of Transportation (Non-Medical): No  Physical Activity: Insufficiently Active   Days of Exercise per Week: 2 days   Minutes of Exercise per Session: 20 min  Stress: No Stress Concern Present   Feeling of Stress : Not at all  Social Connections: Socially Isolated   Frequency of Communication with Friends and Family: More than three times a week   Frequency of Social Gatherings with Friends and Family: More than three times a week   Attends Religious Services: Never   Marine scientist or Organizations: No   Attends Archivist Meetings: Never   Marital Status: Divorced  Human resources officer Violence: Not At Risk   Fear of Current or Ex-Partner: No  Emotionally Abused: No   Physically Abused: No   Sexually Abused: No    FAMILY HISTORY:  No family history on file.  CURRENT MEDICATIONS:  Current Outpatient Medications  Medication Sig Dispense Refill   alfuzosin (UROXATRAL) 10 MG 24 hr tablet Take 10 mg by mouth at bedtime. 2200     allopurinol (ZYLOPRIM) 300 MG tablet Take 300 mg by mouth daily.     atorvastatin (LIPITOR) 40 MG tablet Take 40 mg by mouth daily.     benazepril (LOTENSIN) 40 MG tablet Take 40 mg by mouth daily.  0   busPIRone (BUSPAR) 15 MG tablet Take 15 mg by mouth 2 (two) times daily.     chlorthalidone (HYGROTON) 50 MG tablet Take 50 mg by mouth daily.     colestipol (COLESTID) 1 g tablet Take 4 tablets (4 g total) by mouth daily. Take 4 hours apart form your other medications 360 tablet 3   Cyanocobalamin (VITAMIN B-12) 5000 MCG SUBL Place 15,000 mcg under the tongue daily.     diazepam (VALIUM) 10 MG tablet Take 10 mg by mouth at bedtime.      diclofenac (VOLTAREN) 75 MG EC tablet Take 75 mg by mouth 2 (two) times daily.     DULoxetine (CYMBALTA) 20 MG capsule Take 20 mg by mouth daily.     DULoxetine  (CYMBALTA) 30 MG capsule Take 30 mg by mouth daily as needed.     fluorouracil CALGB 22025 2,400 mg/m2 in sodium chloride 0.9 % 150 mL Inject 2,400 mg/m2 into the vein over 48 hr.     FLUOROURACIL IV Inject 400 mg/m2 into the vein every 14 (fourteen) days.     fluticasone (FLONASE) 50 MCG/ACT nasal spray Place 3-4 sprays into both nostrils daily.     folic acid (FOLVITE) 1 MG tablet folic acid 1 mg tablet  TAKE 1 TABLET BY MOUTH DAILY     gabapentin (NEURONTIN) 400 MG capsule Take 800 mg by mouth 2 (two) times daily. 400-800 mg as needed for pain     HYDROcodone-acetaminophen (NORCO) 10-325 MG tablet Take 1 tablet by mouth 3 (three) times daily.     hydrOXYzine (ATARAX) 25 MG tablet hydroxyzine HCl 25 mg tablet  TAKE 1 TABLET BY MOUTH AT BEDTIME AS NEEDED     LEUCOVORIN CALCIUM IV Inject 400 mg/m2 into the vein every 14 (fourteen) days.     lidocaine-prilocaine (EMLA) cream Apply a small amount to port a cath site (do not rub in) and cover with plastic wrap 1 hour prior to chemotherapy appointments 30 g 3   loperamide (IMODIUM) 2 MG capsule Take 2 mg by mouth as needed. Use on schedule per Dr. Laural Golden.     loratadine (CLARITIN) 10 MG tablet Take 10 mg by mouth daily as needed for allergies (Seasonal).     meclizine (ANTIVERT) 25 MG tablet Take 25 mg by mouth 3 (three) times daily as needed for dizziness (for flare up of inner ear issues.).     Melatonin 10 MG CAPS Take 30 mg by mouth at bedtime.     metoprolol succinate (TOPROL-XL) 100 MG 24 hr tablet Take 100 mg by mouth daily.     Naphazoline-Pheniramine (OPCON-A) 0.027-0.315 % SOLN Place 1 drop into both eyes daily as needed (irritation).     Omega-3 Fatty Acids (OMEGA-3 FISH OIL PO) Take 2,160 mg by mouth daily. 720 mg per cap     OXALIPLATIN IV Inject 85 mg/m2 into the vein every 14 (fourteen) days.  oxymetazoline (AFRIN) 0.05 % nasal spray Place 3 sprays into both nostrils 2 (two) times daily.     potassium chloride SA (KLOR-CON M) 20  MEQ tablet Take 1 tablet (20 mEq total) by mouth daily. 30 tablet 3   prochlorperazine (COMPAZINE) 10 MG tablet Take 1 tablet (10 mg total) by mouth in the morning and at bedtime. Take 28mn before chemotherapy 60 tablet 3   sulfaSALAzine (AZULFIDINE) 500 MG tablet sulfasalazine 500 mg tablet  TAKE 2 TABLETS BY MOUTH TWICE DAILY     tizanidine (ZANAFLEX) 2 MG capsule Take 2 mg by mouth 3 (three) times daily as needed for muscle spasms.     traZODone (DESYREL) 50 MG tablet Take 50 mg by mouth at bedtime.     Current Facility-Administered Medications  Medication Dose Route Frequency Provider Last Rate Last Admin   dicyclomine (BENTYL) tablet 20 mg  20 mg Oral TID AC & HS Setzer, Terri L, NP        ALLERGIES:  No Known Allergies  PHYSICAL EXAM:  Performance status (ECOG): 1 - Symptomatic but completely ambulatory  There were no vitals filed for this visit. Wt Readings from Last 3 Encounters:  08/12/21 285 lb (129.3 kg)  08/10/21 286 lb 3.2 oz (129.8 kg)  08/08/21 283 lb (128.4 kg)   Physical Exam Constitutional:      Appearance: Normal appearance. He is obese.  HENT:     Head: Normocephalic and atraumatic.     Mouth/Throat:     Mouth: Mucous membranes are moist.  Eyes:     Extraocular Movements: Extraocular movements intact.     Pupils: Pupils are equal, round, and reactive to light.  Cardiovascular:     Rate and Rhythm: Normal rate and regular rhythm.     Pulses: Normal pulses.     Heart sounds: Normal heart sounds.  Pulmonary:     Effort: Pulmonary effort is normal.     Breath sounds: Normal breath sounds.  Abdominal:     General: Bowel sounds are normal.     Palpations: Abdomen is soft.     Tenderness: There is no abdominal tenderness.  Musculoskeletal:        General: No swelling.     Right lower leg: No edema.     Left lower leg: No edema.  Lymphadenopathy:     Cervical: No cervical adenopathy.  Skin:    General: Skin is warm and dry.  Neurological:      General: No focal deficit present.     Mental Status: He is alert and oriented to person, place, and time.  Psychiatric:        Mood and Affect: Mood normal.        Behavior: Behavior normal.     LABORATORY DATA:  I have reviewed the labs as listed.  CBC Latest Ref Rng & Units 08/10/2021 07/14/2021 07/07/2021  WBC 4.0 - 10.5 K/uL 5.0 5.2 3.5(L)  Hemoglobin 13.0 - 17.0 g/dL 10.6(L) 10.8(L) 11.1(L)  Hematocrit 39.0 - 52.0 % 31.6(L) 31.8(L) 32.7(L)  Platelets 150 - 400 K/uL 191 216 168   CMP Latest Ref Rng & Units 08/10/2021 07/14/2021 07/07/2021  Glucose 70 - 99 mg/dL 186(H) 261(H) 290(H)  BUN 8 - 23 mg/dL 28(H) 35(H) 52(H)  Creatinine 0.61 - 1.24 mg/dL 1.52(H) 1.41(H) 1.59(H)  Sodium 135 - 145 mmol/L 135 133(L) 133(L)  Potassium 3.5 - 5.1 mmol/L 3.3(L) 3.1(L) 3.4(L)  Chloride 98 - 111 mmol/L 96(L) 96(L) 97(L)  CO2 22 - 32 mmol/L  32 28 29  Calcium 8.9 - 10.3 mg/dL 9.9 9.5 9.6  Total Protein 6.5 - 8.1 g/dL 6.6 6.6 6.3(L)  Total Bilirubin 0.3 - 1.2 mg/dL 0.7 0.7 0.6  Alkaline Phos 38 - 126 U/L 48 43 49  AST 15 - 41 U/L 23 21 21   ALT 0 - 44 U/L 20 24 27     DIAGNOSTIC IMAGING:  I have independently reviewed the scans and discussed with the patient. DG Chest Port 1 View  Result Date: 08/08/2021 CLINICAL DATA:  Left-side Port-A-Cath placement EXAM: PORTABLE CHEST 1 VIEW COMPARISON:  05/13/2021 FINDINGS: Interval placement of left subclavian port a catheter with tip in the projection of the SVC. No pneumothorax identified. Stable cardiomediastinal contours. Lung volumes are low. IMPRESSION: Status post left subclavian port a catheter placement without pneumothorax. Electronically Signed   By: Kerby Moors M.D.   On: 08/08/2021 10:55   DG C-Arm 1-60 Min-No Report  Result Date: 08/08/2021 Fluoroscopy was utilized by the requesting physician.  No radiographic interpretation.     ASSESSMENT & PLAN: 1.  Stage IIIb (CT2N2) rectal cancer - Primary oncologist is Dr. Delton Coombes, full  details per his most recent note - Diagnosed August 2022 - Pelvic MRI (05/12/2021): T2N2 by MRI staging, 4 small lymph nodes 5 mm or greater - Patient received Xeloda and XRT from 06/08/2021 through 07/15/2021 - FOLFOX q. 14 days x 4 months started on 08/10/2021 - Overall, he has tolerated his first cycle of FOLFOX well apart from some nausea, diarrhea, fatigue, and decreased appetite. - Labs today (08/17/2021): CBC shows stable blood counts compared to previous.  Baseline kidney function and normal LFTs on CMP.  No electrolyte disturbances.  Potassium is 3.7 and magnesium is 1.9. - PLAN: Next scheduled appointment with primary oncologist (Dr. Delton Coombes) is 08/24/2021. - Due for cycle #2 of FOLFOX on 08/24/2021  2.  Diarrhea - No diarrhea today, but patient was having up to 6 bowel movements per day earlier this week - She has been taking Imodium with some relief - PLAN: Patient instructed to continue Imodium as prescribed for diarrhea and to call the clinic if he has more than 4 watery bowel movements in a day despite taking Imodium.  3.  Nausea - Patient reports moderate nausea, but without vomiting - PLAN: Patient instructed to continue Compazine as needed and call the clinic if he has severe or refractory nausea/vomiting or is unable to eat and drink.  4.  Hydration - No indication for IV fluids at today's visit. - PLAN: Patient encouraged to drink at least 64 ounces of water each day, especially in the setting of diarrhea.  5.  Weight loss and nutrition - Patient has lost 8 pounds since his visit a week ago.  Weight today is 276.5 pounds. - He reports that he has decreased appetite, but estimates that he is still eating about 75% of his previous food intake. - PLAN: Patient educated on importance of nutrition while undergoing cancer treatment. - Encouraged patient to drink 1 Ensure or similar nutritional beverage each day. - Referral sent for dietitian.  6.  Cold sensitivity - Cold  sensitivity, but no other peripheral neuropathy symptoms - PLAN: Patient instructed to avoid cold stimuli  7.  Chronic back pain - Pain is at baseline.  No rectal pain or cancer associated pain. - PLAN: Continue previously prescribed hydrocodone 10/325 up to 3 tablets daily  8.  Hypokalemia - Potassium today is normal at 3.7 - PLAN: Continue previously prescribed K-Dur 20  mg daily.   PLAN SUMMARY & DISPOSITION: Referral to nutritionist Follow-up with Dr. Delton Coombes next week on 08/24/2021 for cycle #2 of FOLFOX  All questions were answered. The patient knows to call the clinic with any problems, questions or concerns.  Medical decision making: Moderate  Time spent on visit: I spent 25 minutes counseling the patient face to face. The total time spent in the appointment was 40 minutes and more than 50% was on counseling.   Harriett Rush, PA-C  08/17/2021 4:30 PM

## 2021-08-17 ENCOUNTER — Inpatient Hospital Stay (HOSPITAL_COMMUNITY): Payer: Medicare Other

## 2021-08-17 ENCOUNTER — Inpatient Hospital Stay (HOSPITAL_BASED_OUTPATIENT_CLINIC_OR_DEPARTMENT_OTHER): Payer: Medicare Other | Admitting: Physician Assistant

## 2021-08-17 ENCOUNTER — Other Ambulatory Visit: Payer: Self-pay

## 2021-08-17 VITALS — BP 111/74 | HR 76 | Temp 97.0°F | Resp 20 | Ht 73.0 in | Wt 276.5 lb

## 2021-08-17 DIAGNOSIS — Z95828 Presence of other vascular implants and grafts: Secondary | ICD-10-CM

## 2021-08-17 DIAGNOSIS — Z09 Encounter for follow-up examination after completed treatment for conditions other than malignant neoplasm: Secondary | ICD-10-CM | POA: Diagnosis not present

## 2021-08-17 DIAGNOSIS — Z5111 Encounter for antineoplastic chemotherapy: Secondary | ICD-10-CM | POA: Diagnosis not present

## 2021-08-17 DIAGNOSIS — C20 Malignant neoplasm of rectum: Secondary | ICD-10-CM

## 2021-08-17 DIAGNOSIS — R634 Abnormal weight loss: Secondary | ICD-10-CM | POA: Diagnosis not present

## 2021-08-17 LAB — COMPREHENSIVE METABOLIC PANEL
ALT: 17 U/L (ref 0–44)
AST: 15 U/L (ref 15–41)
Albumin: 4.1 g/dL (ref 3.5–5.0)
Alkaline Phosphatase: 51 U/L (ref 38–126)
Anion gap: 9 (ref 5–15)
BUN: 39 mg/dL — ABNORMAL HIGH (ref 8–23)
CO2: 29 mmol/L (ref 22–32)
Calcium: 9.6 mg/dL (ref 8.9–10.3)
Chloride: 95 mmol/L — ABNORMAL LOW (ref 98–111)
Creatinine, Ser: 1.33 mg/dL — ABNORMAL HIGH (ref 0.61–1.24)
GFR, Estimated: 59 mL/min — ABNORMAL LOW (ref >60)
Glucose, Bld: 157 mg/dL — ABNORMAL HIGH (ref 70–99)
Potassium: 3.7 mmol/L (ref 3.5–5.1)
Sodium: 133 mmol/L — ABNORMAL LOW (ref 135–145)
Total Bilirubin: 0.4 mg/dL (ref 0.3–1.2)
Total Protein: 6.7 g/dL (ref 6.5–8.1)

## 2021-08-17 LAB — CBC WITH DIFFERENTIAL/PLATELET
Abs Immature Granulocytes: 0.05 10*3/uL (ref 0.00–0.07)
Basophils Absolute: 0 10*3/uL (ref 0.0–0.1)
Basophils Relative: 1 %
Eosinophils Absolute: 0.1 10*3/uL (ref 0.0–0.5)
Eosinophils Relative: 3 %
HCT: 32.2 % — ABNORMAL LOW (ref 39.0–52.0)
Hemoglobin: 11.5 g/dL — ABNORMAL LOW (ref 13.0–17.0)
Immature Granulocytes: 1 %
Lymphocytes Relative: 11 %
Lymphs Abs: 0.5 10*3/uL — ABNORMAL LOW (ref 0.7–4.0)
MCH: 34.3 pg — ABNORMAL HIGH (ref 26.0–34.0)
MCHC: 35.7 g/dL (ref 30.0–36.0)
MCV: 96.1 fL (ref 80.0–100.0)
Monocytes Absolute: 0.4 10*3/uL (ref 0.1–1.0)
Monocytes Relative: 8 %
Neutro Abs: 3.6 10*3/uL (ref 1.7–7.7)
Neutrophils Relative %: 76 %
Platelets: 282 10*3/uL (ref 150–400)
RBC: 3.35 MIL/uL — ABNORMAL LOW (ref 4.22–5.81)
RDW: 15.1 % (ref 11.5–15.5)
WBC: 4.6 10*3/uL (ref 4.0–10.5)
nRBC: 0 % (ref 0.0–0.2)

## 2021-08-17 LAB — MAGNESIUM: Magnesium: 1.9 mg/dL (ref 1.7–2.4)

## 2021-08-17 MED ORDER — HEPARIN SOD (PORK) LOCK FLUSH 100 UNIT/ML IV SOLN
500.0000 [IU] | Freq: Once | INTRAVENOUS | Status: AC
  Administered 2021-08-17: 500 [IU] via INTRAVENOUS

## 2021-08-17 MED ORDER — SODIUM CHLORIDE 0.9% FLUSH
10.0000 mL | Freq: Once | INTRAVENOUS | Status: AC
  Administered 2021-08-17: 10 mL via INTRAVENOUS

## 2021-08-17 NOTE — Patient Instructions (Signed)
[facility]  Discharge Instructions: Thank you for choosing [facility] to provide your oncology and hematology care.  If you have a lab appointment with the [facility], please come in thru the Main Entrance and check in at the main information desk.  Wear comfortable clothing and clothing appropriate for easy access to any Portacath or PICC line.   We strive to give you quality time with your provider. You may need to reschedule your appointment if you arrive late (15 or more minutes).  Arriving late affects you and other patients whose appointments are after yours.  Also, if you miss three or more appointments without notifying the office, you may be dismissed from the clinic at the provider's discretion.      For prescription refill requests, have your pharmacy contact our office and allow 72 hours for refills to be completed.    Today your port was flushed and labs were drawn, return as scheduled.   To help prevent nausea and vomiting after your treatment, we encourage you to take your nausea medication as directed.  BELOW ARE SYMPTOMS THAT SHOULD BE REPORTED IMMEDIATELY: *FEVER GREATER THAN 100.4 F (38 C) OR HIGHER *CHILLS OR SWEATING *NAUSEA AND VOMITING THAT IS NOT CONTROLLED WITH YOUR NAUSEA MEDICATION *UNUSUAL SHORTNESS OF BREATH *UNUSUAL BRUISING OR BLEEDING *URINARY PROBLEMS (pain or burning when urinating, or frequent urination) *BOWEL PROBLEMS (unusual diarrhea, constipation, pain near the anus) TENDERNESS IN MOUTH AND THROAT WITH OR WITHOUT PRESENCE OF ULCERS (sore throat, sores in mouth, or a toothache) UNUSUAL RASH, SWELLING OR PAIN  UNUSUAL VAGINAL DISCHARGE OR ITCHING   Items with * indicate a potential emergency and should be followed up as soon as possible or go to the Emergency Department if any problems should occur.  Please show the CHEMOTHERAPY ALERT CARD or IMMUNOTHERAPY ALERT CARD at check-in to the Emergency Department and triage  nurse.  Should you have questions after your visit or need to cancel or reschedule your appointment, please contact [facility] [phone]  and follow the prompts.  Office hours are 8:00 a.m. to 4:30 p.m. Monday - Friday. Please note that voicemails left after 4:00 p.m. may not be returned until the following business day.  We are closed weekends and major holidays. You have access to a nurse at all times for urgent questions. Please call the main number to the clinic [phone] and follow the prompts.  For any non-urgent questions, you may also contact your provider using MyChart. We now offer e-Visits for anyone 64 and older to request care online for non-urgent symptoms. For details visit [email or web address].   Also download the MyChart app! Go to the app store, search "MyChart", open the app, select [facility], and log in with your MyChart username and password.  Due to Covid, a mask is required upon entering the hospital/clinic. If you do not have a mask, one will be given to you upon arrival. For doctor visits, patients may have 1 support person aged 50 or older with them. For treatment visits, patients cannot have anyone with them due to current Covid guidelines and our immunocompromised population.

## 2021-08-17 NOTE — Patient Instructions (Addendum)
[facility] Discharge Instructions  You were seen today by Louis Abernethy PA-C to follow-up on your symptoms after your first round of chemotherapy.  Please read carefully through the following recommendations:  DIARRHEA: Continue taking Imodium as prescribed to relieve your diarrhea.  Please call our clinic if you have more than 4 bowel movements in a day or if you have watery diarrhea that is not improved with Imodium.  NAUSEA: Continue to take Compazine as needed for your nausea.  Call the clinic if you have any nausea or vomiting that is not relieved by Compazine, or if you are unable to eat or drink due to your symptoms.  HYDRATION: It is important that you keep drinking plenty of water.  You should drink at least 64 ounces of water each day.  NUTRITION: It is important that you continue to eat as much of your usual diet as you are able to.  You can also try drinking 1 adult nutrition shake (Ensure or similar) each day to maintain your weight while you are receiving chemotherapy.  We will refer you to see our nutritionist at your next appointment.  COLD SENSITIVITY: You may notice that your skin and mouth are extremely sensitive to cold.  Make sure that you stay warm and wear gloves if needed.  Avoid touching any cold items or consuming any cold types of food or drink.  FATIGUE: It is normal to feel "rundown" after your chemotherapy.  Make sure that you get plenty of rest, but try to be as active as you are able to without wearing yourself out.  CALL THE CLINIC IF. You have more than 4 bowel movements per day or have watery diarrhea that is not improved by Imodium. You have nausea and vomiting that is not relieved by Compazine. You are unable to eat or drink. You notice any skin changes such as rash, redness, blistering, or swelling (especially on your hands and feet) You have any other new symptoms such as fever, chest pain, or difficulty  breathing.   FOLLOW-UP APPOINTMENT: Your next follow-up appointment is with Dr. Delton Coombes on 08/24/2021 for labs, Dr. Visit, and your next round of chemotherapy.   Thank you for choosing [facility] to provide your oncology and hematology care.  To afford each patient quality time with our provider, please arrive at least 15 minutes before your scheduled appointment time.   If you have a lab appointment with the [facility] please come in thru the Main Entrance and check in at the main information desk.  You need to re-schedule your appointment should you arrive 10 or more minutes late.  We strive to give you quality time with our providers, and arriving late affects you and other patients whose appointments are after yours.  Also, if you no show three or more times for appointments you may be dismissed from the clinic at the providers discretion.     Again, thank you for choosing [facility].  Our hope is that these requests will decrease the amount of time that you wait before being seen by our physicians.       _____________________________________________________________  Should you have questions after your visit to [facility], please contact our office at [phone] and follow the prompts.  Our office hours are 8:00 a.m. and 4:30 p.m. Monday - Friday.  Please note that voicemails left after 4:00 p.m. may not be returned  until the following business day.  We are closed weekends and major holidays.  You do have access to a nurse 24-7, just call the main number to the clinic [phone] and do not press any options, hold on the line and a nurse will answer the phone.    For prescription refill requests, have your pharmacy contact our office and allow 72 hours.    Due to Covid, you will need to wear a mask upon entering the hospital. If you do not have a mask, a mask will be given to you at the Main Entrance upon arrival. For  doctor visits, patients may have 1 support person age 64 or older with them. For treatment visits, patients can not have anyone with them due to social distancing guidelines and our immunocompromised population.

## 2021-08-17 NOTE — Progress Notes (Signed)
Port flushed with good blood return noted. No bruising or swelling at site. Bandaid applied and patient discharged in satisfactory condition. VVS stable with no signs or symptoms of distressed noted. Patient declined AVS.

## 2021-08-18 ENCOUNTER — Other Ambulatory Visit (HOSPITAL_COMMUNITY): Payer: Self-pay | Admitting: *Deleted

## 2021-08-18 MED ORDER — DIPHENOXYLATE-ATROPINE 2.5-0.025 MG PO TABS: 1.0000 | ORAL_TABLET | Freq: Four times a day (QID) | ORAL | 0 refills | Status: DC | PRN

## 2021-08-18 MED ORDER — DIPHENOXYLATE-ATROPINE 2.5-0.025 MG PO TABS: 1.0000 | ORAL_TABLET | Freq: Four times a day (QID) | ORAL | 2 refills | Status: DC | PRN

## 2021-08-18 NOTE — Progress Notes (Signed)
Discussed diarrhea with patient, who states that he is taking imodium as prescribed with no results.  Script sent to Tarri Abernethy to sign off on and discussed use with patient.  Advised him to let us know if this does not work for him.  Verbalized understanding.

## 2021-08-22 DIAGNOSIS — Z7952 Long term (current) use of systemic steroids: Secondary | ICD-10-CM | POA: Diagnosis not present

## 2021-08-23 NOTE — Progress Notes (Signed)
Louis House, [place] [address]   CLINIC:  Medical Oncology/Hematology  PCP:  Louis Burly, MD [place] / [place] [id number] [phone]   REASON FOR VISIT:  Follow-up for rectal cancer  PRIOR THERAPY: Capecitabine + XRT  NGS Results: not done  CURRENT THERAPY: FOLFOX q14d x 4 months (started 08/10/21)  BRIEF ONCOLOGIC HISTORY:  Oncology History  Rectal cancer ([place])  05/01/2021 Initial Diagnosis   Rectal adenocarcinoma ([place])   05/30/2021 - 05/30/2021 Chemotherapy   Patient is on Treatment Plan : COLORECTAL Capecitabine + XRT     08/10/2021 -  Chemotherapy   Patient is on Treatment Plan : COLORECTAL FOLFOX q14d x 4 months       CANCER STAGING:  Cancer Staging  Rectal cancer ([place]) Staging form: Colon and Rectum, AJCC 8th Edition - Clinical stage from 05/18/2021: Stage IIIB (cT2, cN2a, cM0) - Unsigned   INTERVAL HISTORY:  [name], a 65 y.o. male, returns for routine follow-up and consideration for next cycle of chemotherapy. Louis House was last seen on 08/10/2021.  Due for cycle #2 of FOLFOX today.   Overall, he tells me he has been feeling pretty well. He reports nausea and diarrhea for which he took compazine and imodium; he takes 1 tablet of imodium daily. He is taking potassium. He reports cold sensitivity. He denies any current tingling/numbness and mouth sores.   Overall, he feels ready for next cycle of chemo today.   REVIEW OF SYSTEMS:  Review of Systems  Constitutional:  Negative for appetite change and fatigue (75%).  HENT:   Negative for mouth sores.   Gastrointestinal:  Positive for diarrhea and nausea.  Neurological:  Positive for dizziness and numbness (cold sensitivity).  All other systems reviewed and are negative.  PAST MEDICAL/SURGICAL HISTORY:  Past Medical History:  Diagnosis Date   Anxiety    Diabetes ([place]) 01/12/2017   Diarrhea 02/21/2017   Essential hypertension, benign 01/12/2017    Gout    High cholesterol 01/12/2017   Neuropathy    Port-A-Cath in place 07/28/2021   Sleep apnea    Vertigo    Past Surgical History:  Procedure Laterality Date   BIOPSY  04/06/2017   Procedure: BIOPSY;  Surgeon: Rogene Houston, MD;  Location: [facility];  Service: Endoscopy;;  colon   BIOPSY  04/12/2021   Procedure: BIOPSY;  Surgeon: Montez Morita, Quillian Quince, MD;  Location: [facility];  Service: Gastroenterology;;  nodular area in ascending colon biopsy random colon bx   bone spurs     CATARACT EXTRACTION W/PHACO Left 04/22/2018   Procedure: CATARACT EXTRACTION PHACO AND INTRAOCULAR LENS PLACEMENT LEFT EYE;  Surgeon: Tonny Branch, MD;  Location: [facility] ORS;  Service: Ophthalmology;  Laterality: Left;  CDE: 7.58   COLONOSCOPY WITH PROPOFOL N/A 04/06/2017   Procedure: COLONOSCOPY WITH PROPOFOL;  Surgeon: Rogene Houston, MD;  Location: [facility];  Service: Endoscopy;  Laterality: N/A;  1:45   COLONOSCOPY WITH PROPOFOL N/A 04/12/2021   Procedure: COLONOSCOPY WITH PROPOFOL;  Surgeon: Harvel Quale, MD;  Location: [facility];  Service: Gastroenterology;  Laterality: N/A;  9:05   FLEXIBLE SIGMOIDOSCOPY N/A 05/10/2021   Procedure: FLEXIBLE SIGMOIDOSCOPY;  Surgeon: Harvel Quale, MD;  Location: [facility];  Service: Gastroenterology;  Laterality: N/A;  9:50 ASA 1 Per Dr C no Anesthesia, no PAT - ok per Cecilio Asper arthroscopy     both knees  Left shoulder surgery from injury     PORTACATH PLACEMENT Left 08/08/2021   Procedure: INSERTION PORT-A-CATH;  Surgeon: Aviva Signs, MD;  Location: [facility] ORS;  Service: General;  Laterality: Left;   Umblical hernia      SOCIAL HISTORY:  Social History   Socioeconomic History   Marital status: Divorced    Spouse name: Not on file   Number of children: Not on file   Years of education: Not on file   Highest education level: Not on file  Occupational History   Not on file  Tobacco Use   Smoking status: Never    Smokeless tobacco: Never  Vaping Use   Vaping Use: Never used  Substance and Sexual Activity   Alcohol use: No   Drug use: No   Sexual activity: Yes    Birth control/protection: None  Other Topics Concern   Not on file  Social History Narrative   Not on file   Social Determinants of Health   Financial Resource Strain: Low Risk    Difficulty of Paying Living Expenses: Not very hard  Food Insecurity: No Food Insecurity   Worried About Running Out of Food in the Last Year: Never true   Ran Out of Food in the Last Year: Never true  Transportation Needs: No Transportation Needs   Lack of Transportation (Medical): No   Lack of Transportation (Non-Medical): No  Physical Activity: Insufficiently Active   Days of Exercise per Week: 2 days   Minutes of Exercise per Session: 20 min  Stress: No Stress Concern Present   Feeling of Stress : Not at all  Social Connections: Socially Isolated   Frequency of Communication with Friends and Family: More than three times a week   Frequency of Social Gatherings with Friends and Family: More than three times a week   Attends Religious Services: Never   Marine scientist or Organizations: No   Attends Music therapist: Never   Marital Status: Divorced  Human resources officer Violence: Not At Risk   Fear of Current or Ex-Partner: No   Emotionally Abused: No   Physically Abused: No   Sexually Abused: No    FAMILY HISTORY:  No family history on file.  CURRENT MEDICATIONS:  Current Outpatient Medications  Medication Sig Dispense Refill   alfuzosin (UROXATRAL) 10 MG 24 hr tablet Take 10 mg by mouth at bedtime. 2200     allopurinol (ZYLOPRIM) 300 MG tablet Take 300 mg by mouth daily.     atorvastatin (LIPITOR) 40 MG tablet Take 40 mg by mouth daily.     benazepril (LOTENSIN) 40 MG tablet Take 40 mg by mouth daily.  0   busPIRone (BUSPAR) 15 MG tablet Take 15 mg by mouth 2 (two) times daily.     chlorthalidone (HYGROTON) 50 MG  tablet Take 50 mg by mouth daily.     colestipol (COLESTID) 1 g tablet Take 4 tablets (4 g total) by mouth daily. Take 4 hours apart form your other medications 360 tablet 3   Cyanocobalamin (VITAMIN B-12) 5000 MCG SUBL Place 15,000 mcg under the tongue daily.     diazepam (VALIUM) 10 MG tablet Take 10 mg by mouth at bedtime.      diclofenac (VOLTAREN) 75 MG EC tablet Take 75 mg by mouth 2 (two) times daily.     DULoxetine (CYMBALTA) 20 MG capsule Take 20 mg by mouth daily.     DULoxetine (CYMBALTA) 30 MG capsule Take 30 mg by mouth daily  as needed.     fluorouracil CALGB 67209 2,400 mg/m2 in sodium chloride 0.9 % 150 mL Inject 2,400 mg/m2 into the vein over 48 hr.     FLUOROURACIL IV Inject 400 mg/m2 into the vein every 14 (fourteen) days.     fluticasone (FLONASE) 50 MCG/ACT nasal spray Place 3-4 sprays into both nostrils daily.     folic acid (FOLVITE) 1 MG tablet folic acid 1 mg tablet  TAKE 1 TABLET BY MOUTH DAILY     gabapentin (NEURONTIN) 400 MG capsule Take 800 mg by mouth 2 (two) times daily. 400-800 mg as needed for pain     HYDROcodone-acetaminophen (NORCO) 10-325 MG tablet Take 1 tablet by mouth 3 (three) times daily.     LEUCOVORIN CALCIUM IV Inject 400 mg/m2 into the vein every 14 (fourteen) days.     lidocaine-prilocaine (EMLA) cream Apply a small amount to port a cath site (do not rub in) and cover with plastic wrap 1 hour prior to chemotherapy appointments 30 g 3   loperamide (IMODIUM) 2 MG capsule Take 2 mg by mouth as needed. Use on schedule per Dr. Laural Golden.     loratadine (CLARITIN) 10 MG tablet Take 10 mg by mouth daily as needed for allergies (Seasonal).     meclizine (ANTIVERT) 25 MG tablet Take 25 mg by mouth 3 (three) times daily as needed for dizziness (for flare up of inner ear issues.).     Melatonin 10 MG CAPS Take 30 mg by mouth at bedtime.     metoprolol succinate (TOPROL-XL) 100 MG 24 hr tablet Take 100 mg by mouth daily.     Omega-3 Fatty Acids (OMEGA-3 FISH OIL  PO) Take 2,160 mg by mouth daily. 720 mg per cap     OXALIPLATIN IV Inject 85 mg/m2 into the vein every 14 (fourteen) days.     oxymetazoline (AFRIN) 0.05 % nasal spray Place 3 sprays into both nostrils 2 (two) times daily.     potassium chloride SA (KLOR-CON M) 20 MEQ tablet Take 1 tablet (20 mEq total) by mouth daily. 30 tablet 3   prochlorperazine (COMPAZINE) 10 MG tablet Take 1 tablet (10 mg total) by mouth in the morning and at bedtime. Take 68mn before chemotherapy 60 tablet 3   sulfaSALAzine (AZULFIDINE) 500 MG tablet sulfasalazine 500 mg tablet  TAKE 2 TABLETS BY MOUTH TWICE DAILY     traZODone (DESYREL) 50 MG tablet Take 50 mg by mouth at bedtime.     diphenoxylate-atropine (LOMOTIL) 2.5-0.025 MG tablet Take 1 tablet by mouth 4 (four) times daily as needed for diarrhea or loose stools. (Patient not taking: Reported on 08/24/2021) 30 tablet 2   hydrOXYzine (ATARAX) 25 MG tablet hydroxyzine HCl 25 mg tablet  TAKE 1 TABLET BY MOUTH AT BEDTIME AS NEEDED (Patient not taking: Reported on 08/24/2021)     Naphazoline-Pheniramine (OPCON-A) 0.027-0.315 % SOLN Place 1 drop into both eyes daily as needed (irritation). (Patient not taking: Reported on 08/24/2021)     tizanidine (ZANAFLEX) 2 MG capsule Take 2 mg by mouth 3 (three) times daily as needed for muscle spasms. (Patient not taking: Reported on 08/24/2021)     Current Facility-Administered Medications  Medication Dose Route Frequency Provider Last Rate Last Admin   dicyclomine (BENTYL) tablet 20 mg  20 mg Oral TID AC & HS Setzer, Terri L, NP        ALLERGIES:  No Known Allergies  PHYSICAL EXAM:  Performance status (ECOG): 0 - Asymptomatic  Vitals:  08/24/21 0925  BP: 120/63  Pulse: 72  Resp: 20  Temp: 97.7 F (36.5 C)  SpO2: 98%   Wt Readings from Last 3 Encounters:  08/24/21 284 lb 9.6 oz (129.1 kg)  08/17/21 276 lb 8 oz (125.4 kg)  08/12/21 285 lb (129.3 kg)   Physical Exam Vitals reviewed.  Constitutional:       Appearance: Normal appearance. He is obese.  Cardiovascular:     Rate and Rhythm: Normal rate and regular rhythm.     Pulses: Normal pulses.     Heart sounds: Normal heart sounds.  Pulmonary:     Effort: Pulmonary effort is normal.     Breath sounds: Normal breath sounds.  Neurological:     General: No focal deficit present.     Mental Status: He is alert and oriented to person, place, and time.  Psychiatric:        Mood and Affect: Mood normal.        Behavior: Behavior normal.    LABORATORY DATA:  I have reviewed the labs as listed.  CBC Latest Ref Rng & Units 08/24/2021 08/17/2021 08/10/2021  WBC 4.0 - 10.5 K/uL 5.9 4.6 5.0  Hemoglobin 13.0 - 17.0 g/dL 10.0(L) 11.5(L) 10.6(L)  Hematocrit 39.0 - 52.0 % 30.0(L) 32.2(L) 31.6(L)  Platelets 150 - 400 K/uL 192 282 191   CMP Latest Ref Rng & Units 08/17/2021 08/10/2021 07/14/2021  Glucose 70 - 99 mg/dL 157(H) 186(H) 261(H)  BUN 8 - 23 mg/dL 39(H) 28(H) 35(H)  Creatinine 0.61 - 1.24 mg/dL 1.33(H) 1.52(H) 1.41(H)  Sodium 135 - 145 mmol/L 133(L) 135 133(L)  Potassium 3.5 - 5.1 mmol/L 3.7 3.3(L) 3.1(L)  Chloride 98 - 111 mmol/L 95(L) 96(L) 96(L)  CO2 22 - 32 mmol/L 29 32 28  Calcium 8.9 - 10.3 mg/dL 9.6 9.9 9.5  Total Protein 6.5 - 8.1 g/dL 6.7 6.6 6.6  Total Bilirubin 0.3 - 1.2 mg/dL 0.4 0.7 0.7  Alkaline Phos 38 - 126 U/L 51 48 43  AST 15 - 41 U/L _0 ALT 0 - 44 U/L _1 DIAGNOSTIC IMAGING:  I have independently reviewed the scans and discussed with the patient. DG Chest Port 1 View  Result Date: 08/08/2021 CLINICAL DATA:  Left-side Port-A-Cath placement EXAM: PORTABLE CHEST 1 VIEW COMPARISON:  05/13/2021 FINDINGS: Interval placement of left subclavian port a catheter with tip in the projection of the SVC. No pneumothorax identified. Stable cardiomediastinal contours. Lung volumes are low. IMPRESSION: Status post left subclavian port a catheter placement without pneumothorax. Electronically Signed   By: Kerby Moors  M.D.   On: 08/08/2021 10:55   DG C-Arm 1-60 Min-No Report  Result Date: 08/08/2021 Fluoroscopy was utilized by the requesting physician.  No radiographic interpretation.     ASSESSMENT:  Stage III (T2N2) rectal cancer, MSI stable: - He reported diarrhea which started in May.  Prior colonoscopy was in 2018. - Colonoscopy on 04/12/2021 showed fungating polypoid nonobstructing mass with central depression found at 8 cm from the anal verge.  Mass was noncircumferential, measured 2 cm in length.  Nodular mucosa in the proximal ascending colon which was biopsied. - Pathology of the rectal mass consistent with adenocarcinoma.  Nodular area in the ascending colon was consistent with collagenous colitis.  MMR preserved.  MSI stable. - CT CAP on 05/13/2021 with no evidence of metastatic disease in the chest, abdomen or pelvis.  Mild hepatic steatosis. - Pelvic MRI on 05/12/2021-T2N2 by MRI staging.  4 small lymph nodes 5 mm or greater. - Total neoadjuvant therapy was recommended on discussion with Dr. Marcello Moores. - Xeloda and XRT from 06/08/2021 through 07/15/2021. - FOLFOX started on 08/10/2021.  2.  Social/family history: - He lives at home by himself.  Daughter lives in [place]. - He worked at [employer] in [place] prior to retirement.  Non-smoker. - Sister had lung cancer and was a smoker.  No family history of colon cancer.   PLAN:  Stage III (T2N2) rectal adenocarcinoma, MSI stable: - He has tolerated first cycle of FOLFOX reasonably well. - He had some diarrhea and had to take Imodium. - Denied any nausea or vomiting.  He had cold sensitivity but denies any continuous tingling or numbness. - Reviewed labs today which showed normal LFTs.  CBC shows normal white count platelet count with normal ANC.  Last CEA was 2.3 on 08/10/2021. - He will proceed with cycle 2 without any dose modifications.  RTC 2 weeks for follow-up.   2.  Normocytic anemia: - Combination anemia from CKD and relative  iron deficiency. - Hemoglobin today is 10.0.  We will check ferritin and iron panel today.  If they are low, consider Feraheme x2.  3.  Diarrhea: - Continue colestipol 4 g daily. - Continue to use Imodium as needed.  He is taking 2 mg tablet daily.  4.  Chronic back pain: - Continue hydrocodone 10/325 daily as needed.  5.  Hypokalemia: - Potassium was 3.8.  Continue potassium 20 mEq daily.   Orders placed this encounter:  No orders of the defined types were placed in this encounter.    Derek Jack, MD [place] [phone]   I, Thana Ates, am acting as a scribe for Dr. Derek Jack.  I, Derek Jack MD, have reviewed the above documentation for accuracy and completeness, and I agree with the above.

## 2021-08-24 ENCOUNTER — Inpatient Hospital Stay (HOSPITAL_BASED_OUTPATIENT_CLINIC_OR_DEPARTMENT_OTHER): Payer: Medicare Other | Admitting: Hematology

## 2021-08-24 ENCOUNTER — Inpatient Hospital Stay (HOSPITAL_COMMUNITY): Payer: Medicare Other

## 2021-08-24 ENCOUNTER — Other Ambulatory Visit: Payer: Self-pay

## 2021-08-24 VITALS — BP 120/63 | HR 72 | Temp 97.7°F | Resp 20 | Ht 70.0 in | Wt 284.6 lb

## 2021-08-24 VITALS — BP 125/78 | HR 78 | Temp 97.4°F | Resp 20

## 2021-08-24 DIAGNOSIS — D539 Nutritional anemia, unspecified: Secondary | ICD-10-CM

## 2021-08-24 DIAGNOSIS — C20 Malignant neoplasm of rectum: Secondary | ICD-10-CM

## 2021-08-24 DIAGNOSIS — D520 Dietary folate deficiency anemia: Secondary | ICD-10-CM | POA: Diagnosis not present

## 2021-08-24 DIAGNOSIS — Z95828 Presence of other vascular implants and grafts: Secondary | ICD-10-CM

## 2021-08-24 DIAGNOSIS — Z5111 Encounter for antineoplastic chemotherapy: Secondary | ICD-10-CM | POA: Diagnosis not present

## 2021-08-24 LAB — CBC WITH DIFFERENTIAL/PLATELET
Abs Immature Granulocytes: 0.06 K/uL (ref 0.00–0.07)
Basophils Absolute: 0.1 K/uL (ref 0.0–0.1)
Basophils Relative: 1 %
Eosinophils Absolute: 0.2 K/uL (ref 0.0–0.5)
Eosinophils Relative: 4 %
HCT: 30 % — ABNORMAL LOW (ref 39.0–52.0)
Hemoglobin: 10 g/dL — ABNORMAL LOW (ref 13.0–17.0)
Immature Granulocytes: 1 %
Lymphocytes Relative: 7 %
Lymphs Abs: 0.4 K/uL — ABNORMAL LOW (ref 0.7–4.0)
MCH: 32.6 pg (ref 26.0–34.0)
MCHC: 33.3 g/dL (ref 30.0–36.0)
MCV: 97.7 fL (ref 80.0–100.0)
Monocytes Absolute: 0.6 K/uL (ref 0.1–1.0)
Monocytes Relative: 11 %
Neutro Abs: 4.5 K/uL (ref 1.7–7.7)
Neutrophils Relative %: 76 %
Platelets: 192 K/uL (ref 150–400)
RBC: 3.07 MIL/uL — ABNORMAL LOW (ref 4.22–5.81)
RDW: 15.4 % (ref 11.5–15.5)
WBC: 5.9 K/uL (ref 4.0–10.5)
nRBC: 0 % (ref 0.0–0.2)

## 2021-08-24 LAB — COMPREHENSIVE METABOLIC PANEL WITH GFR
ALT: 26 U/L (ref 0–44)
AST: 25 U/L (ref 15–41)
Albumin: 3.7 g/dL (ref 3.5–5.0)
Alkaline Phosphatase: 57 U/L (ref 38–126)
Anion gap: 8 (ref 5–15)
BUN: 34 mg/dL — ABNORMAL HIGH (ref 8–23)
CO2: 28 mmol/L (ref 22–32)
Calcium: 9 mg/dL (ref 8.9–10.3)
Chloride: 99 mmol/L (ref 98–111)
Creatinine, Ser: 1.27 mg/dL — ABNORMAL HIGH (ref 0.61–1.24)
GFR, Estimated: >60 mL/min
Glucose, Bld: 156 mg/dL — ABNORMAL HIGH (ref 70–99)
Potassium: 3.8 mmol/L (ref 3.5–5.1)
Sodium: 135 mmol/L (ref 135–145)
Total Bilirubin: 0.4 mg/dL (ref 0.3–1.2)
Total Protein: 6.5 g/dL (ref 6.5–8.1)

## 2021-08-24 LAB — IRON AND TIBC
Iron: 61 ug/dL (ref 45–182)
Saturation Ratios: 17 % — ABNORMAL LOW (ref 17.9–39.5)
TIBC: 365 ug/dL (ref 250–450)
UIBC: 304 ug/dL

## 2021-08-24 LAB — FERRITIN: Ferritin: 58 ng/mL (ref 24–336)

## 2021-08-24 LAB — MAGNESIUM: Magnesium: 1.9 mg/dL (ref 1.7–2.4)

## 2021-08-24 MED ORDER — DEXTROSE 5 % IV SOLN: Freq: Once | INTRAVENOUS | Status: DC

## 2021-08-24 MED ORDER — SODIUM CHLORIDE 0.9% FLUSH: 10.0000 mL | INTRAVENOUS | Status: DC | PRN

## 2021-08-24 MED ORDER — DEXTROSE 5 % IV SOLN: Freq: Once | INTRAVENOUS | Status: AC

## 2021-08-24 MED ORDER — PALONOSETRON HCL INJECTION 0.25 MG/5ML
0.2500 mg | Freq: Once | INTRAVENOUS | Status: AC
  Administered 2021-08-24: 11:00:00 0.25 mg via INTRAVENOUS
  Filled 2021-08-24: qty 5

## 2021-08-24 MED ORDER — FLUOROURACIL CHEMO INJECTION 2.5 GM/50ML
400.0000 mg/m2 | Freq: Once | INTRAVENOUS | Status: AC
  Administered 2021-08-24: 1050 mg via INTRAVENOUS
  Filled 2021-08-24: qty 21

## 2021-08-24 MED ORDER — SODIUM CHLORIDE 0.9 % IV SOLN
10.0000 mg | Freq: Once | INTRAVENOUS | Status: AC
  Administered 2021-08-24: 11:00:00 10 mg via INTRAVENOUS
  Filled 2021-08-24: qty 10

## 2021-08-24 MED ORDER — OXALIPLATIN CHEMO INJECTION 100 MG/20ML
85.0000 mg/m2 | Freq: Once | INTRAVENOUS | Status: AC
  Administered 2021-08-24: 220 mg via INTRAVENOUS
  Filled 2021-08-24: qty 40

## 2021-08-24 MED ORDER — LEUCOVORIN CALCIUM INJECTION 350 MG
389.0000 mg/m2 | Freq: Once | INTRAVENOUS | Status: AC
  Administered 2021-08-24: 12:00:00 1000 mg via INTRAVENOUS
  Filled 2021-08-24: qty 50

## 2021-08-24 MED ORDER — SODIUM CHLORIDE 0.9 % IV SOLN
2400.0000 mg/m2 | INTRAVENOUS | Status: DC
  Administered 2021-08-24: 16:00:00 6150 mg via INTRAVENOUS
  Filled 2021-08-24: qty 123

## 2021-08-24 NOTE — Patient Instructions (Signed)
[place]  Discharge Instructions: Thank you for choosing [facility] to provide your oncology and hematology care.  If you have a lab appointment with the [facility], please come in thru the Main Entrance and check in at the main information desk.  Wear comfortable clothing and clothing appropriate for easy access to any Portacath or PICC line.   We strive to give you quality time with your provider. You may need to reschedule your appointment if you arrive late (15 or more minutes).  Arriving late affects you and other patients whose appointments are after yours.  Also, if you miss three or more appointments without notifying the office, you may be dismissed from the clinic at the providers discretion.      For prescription refill requests, have your pharmacy contact our office and allow 72 hours for refills to be completed.    Today you received the following chemotherapy and/or immunotherapy agents FOLFOX, with chemo pump connected. Return as scheduled.   To help prevent nausea and vomiting after your treatment, we encourage you to take your nausea medication as directed.  BELOW ARE SYMPTOMS THAT SHOULD BE REPORTED IMMEDIATELY: *FEVER GREATER THAN 100.4 F (38 C) OR HIGHER *CHILLS OR SWEATING *NAUSEA AND VOMITING THAT IS NOT CONTROLLED WITH YOUR NAUSEA MEDICATION *UNUSUAL SHORTNESS OF BREATH *UNUSUAL BRUISING OR BLEEDING *URINARY PROBLEMS (pain or burning when urinating, or frequent urination) *BOWEL PROBLEMS (unusual diarrhea, constipation, pain near the anus) TENDERNESS IN MOUTH AND THROAT WITH OR WITHOUT PRESENCE OF ULCERS (sore throat, sores in mouth, or a toothache) UNUSUAL RASH, SWELLING OR PAIN  UNUSUAL VAGINAL DISCHARGE OR ITCHING   Items with * indicate a potential emergency and should be followed up as soon as possible or go to the Emergency Department if any problems should occur.  Please show the CHEMOTHERAPY ALERT CARD or IMMUNOTHERAPY  ALERT CARD at check-in to the Emergency Department and triage nurse.  Should you have questions after your visit or need to cancel or reschedule your appointment, please contact [facility] [phone]  and follow the prompts.  Office hours are 8:00 a.m. to 4:30 p.m. Monday - Friday. Please note that voicemails left after 4:00 p.m. may not be returned until the following business day.  We are closed weekends and major holidays. You have access to a nurse at all times for urgent questions. Please call the main number to the clinic [phone] and follow the prompts.  For any non-urgent questions, you may also contact your provider using MyChart. We now offer e-Visits for anyone 65 and older to request care online for non-urgent symptoms. For details visit [email or web address].   Also download the MyChart app! Go to the app store, search "MyChart", open the app, select [facility], and log in with your MyChart username and password.  Due to Covid, a mask is required upon entering the hospital/clinic. If you do not have a mask, one will be given to you upon arrival. For doctor visits, patients may have 1 support person aged 65 or older with them. For treatment visits, patients cannot have anyone with them due to current Covid guidelines and our immunocompromised population.

## 2021-08-24 NOTE — Patient Instructions (Signed)
[place] at [facility] Discharge Instructions   You were seen and examined today by Dr. Delton Coombes. He reviewed your lab results, which are normal/stable.  We will proceed with your second cycle of treatment today. We will see you back in 2 weeks and check you lab work and treatment.    Thank you for choosing [place] at [facility] to provide your oncology and hematology care.  To afford each patient quality time with our provider, please arrive at least 15 minutes before your scheduled appointment time.   If you have a lab appointment with the [facility] please come in thru the Main Entrance and check in at the main information desk.  You need to re-schedule your appointment should you arrive 10 or more minutes late.  We strive to give you quality time with our providers, and arriving late affects you and other patients whose appointments are after yours.  Also, if you no show three or more times for appointments you may be dismissed from the clinic at the providers discretion.     Again, thank you for choosing [facility].  Our hope is that these requests will decrease the amount of time that you wait before being seen by our physicians.       _____________________________________________________________  Should you have questions after your visit to [facility], please contact our office at [phone] and follow the prompts.  Our office hours are 8:00 a.m. and 4:30 p.m. Monday - Friday.  Please note that voicemails left after 4:00 p.m. may not be returned until the following business day.  We are closed weekends and major holidays.  You do have access to a nurse 24-7, just call the main number to the clinic [phone] and do not press any options, hold on the line and a nurse will answer the phone.    For prescription refill requests, have your pharmacy contact our office and allow 72 hours.    Due to  Covid, you will need to wear a mask upon entering the hospital. If you do not have a mask, a mask will be given to you at the Main Entrance upon arrival. For doctor visits, patients may have 1 support person age 65 or older with them. For treatment visits, patients can not have anyone with them due to social distancing guidelines and our immunocompromised population.

## 2021-08-24 NOTE — Progress Notes (Signed)
Patient has been examined by Dr. Delton Coombes, and vital signs and labs have been reviewed. ANC, Creatinine, LFTs, hemoglobin, and platelets are within treatment parameters per M.D. - pt may proceed with treatment.

## 2021-08-24 NOTE — Progress Notes (Signed)
Patient tolerated chemotherapy with no complaints voiced. Side effects with management reviewed understanding verbalized. Port site clean and dry with no bruising or swelling noted at site. Good blood return noted before and after administration of chemotherapy. Chemo pump connected with no alarms noted. Patient left in satisfactory condition with VSS and no s/s of distress noted.

## 2021-08-25 ENCOUNTER — Telehealth (HOSPITAL_COMMUNITY): Payer: Self-pay | Admitting: Dietician

## 2021-08-25 ENCOUNTER — Encounter (HOSPITAL_COMMUNITY): Payer: Medicare Other | Admitting: Dietician

## 2021-08-25 NOTE — Telephone Encounter (Signed)
Nutrition    Reason for Assessment: Provider request (wt loss, poor appetite)  Patient did not answer for scheduled telephone visit. Left voicemail with request for return call. Contact information provided. Message sent to scheduling to offer patient another appointment.

## 2021-08-26 ENCOUNTER — Inpatient Hospital Stay (HOSPITAL_COMMUNITY): Payer: Medicare Other

## 2021-08-26 ENCOUNTER — Other Ambulatory Visit: Payer: Self-pay

## 2021-08-26 VITALS — BP 93/68 | HR 73 | Temp 97.4°F | Resp 18

## 2021-08-26 DIAGNOSIS — Z5111 Encounter for antineoplastic chemotherapy: Secondary | ICD-10-CM | POA: Diagnosis not present

## 2021-08-26 DIAGNOSIS — C20 Malignant neoplasm of rectum: Secondary | ICD-10-CM

## 2021-08-26 DIAGNOSIS — Z95828 Presence of other vascular implants and grafts: Secondary | ICD-10-CM

## 2021-08-26 MED ORDER — HEPARIN SOD (PORK) LOCK FLUSH 100 UNIT/ML IV SOLN
500.0000 [IU] | Freq: Once | INTRAVENOUS | Status: AC | PRN
  Administered 2021-08-26: 500 [IU]

## 2021-08-26 MED ORDER — SODIUM CHLORIDE 0.9% FLUSH
10.0000 mL | INTRAVENOUS | Status: DC | PRN
  Administered 2021-08-26: 10 mL

## 2021-08-26 NOTE — Patient Instructions (Signed)
[facility]  Discharge Instructions: Thank you for choosing [facility] to provide your oncology and hematology care.  If you have a lab appointment with the Cancer Center, please come in thru the Main Entrance and check in at the main information desk.  Wear comfortable clothing and clothing appropriate for easy access to any Portacath or PICC line.   We strive to give you quality time with your provider. You may need to reschedule your appointment if you arrive late (15 or more minutes).  Arriving late affects you and other patients whose appointments are after yours.  Also, if you miss three or more appointments without notifying the office, you may be dismissed from the clinic at the provider's discretion.      For prescription refill requests, have your pharmacy contact our office and allow 72 hours for refills to be completed.        To help prevent nausea and vomiting after your treatment, we encourage you to take your nausea medication as directed.  BELOW ARE SYMPTOMS THAT SHOULD BE REPORTED IMMEDIATELY: *FEVER GREATER THAN 100.4 F (38 C) OR HIGHER *CHILLS OR SWEATING *NAUSEA AND VOMITING THAT IS NOT CONTROLLED WITH YOUR NAUSEA MEDICATION *UNUSUAL SHORTNESS OF BREATH *UNUSUAL BRUISING OR BLEEDING *URINARY PROBLEMS (pain or burning when urinating, or frequent urination) *BOWEL PROBLEMS (unusual diarrhea, constipation, pain near the anus) TENDERNESS IN MOUTH AND THROAT WITH OR WITHOUT PRESENCE OF ULCERS (sore throat, sores in mouth, or a toothache) UNUSUAL RASH, SWELLING OR PAIN  UNUSUAL VAGINAL DISCHARGE OR ITCHING   Items with * indicate a potential emergency and should be followed up as soon as possible or go to the Emergency Department if any problems should occur.  Please show the CHEMOTHERAPY ALERT CARD or IMMUNOTHERAPY ALERT CARD at check-in to the Emergency Department and triage nurse.  Should you have questions after your visit or need to cancel  or reschedule your appointment, please contact [facility] [phone]  and follow the prompts.  Office hours are 8:00 a.m. to 4:30 p.m. Monday - Friday. Please note that voicemails left after 4:00 p.m. may not be returned until the following business day.  We are closed weekends and major holidays. You have access to a nurse at all times for urgent questions. Please call the main number to the clinic [phone] and follow the prompts.  For any non-urgent questions, you may also contact your provider using MyChart. We now offer e-Visits for anyone 18 and older to request care online for non-urgent symptoms. For details visit [email or web address].   Also download the MyChart app! Go to the app store, search "MyChart", open the app, select [facility], and log in with your MyChart username and password.  Due to Covid, a mask is required upon entering the hospital/clinic. If you do not have a mask, one will be given to you upon arrival. For doctor visits, patients may have 1 support person aged 18 or older with them. For treatment visits, patients cannot have anyone with them due to current Covid guidelines and our immunocompromised population.  

## 2021-08-26 NOTE — Progress Notes (Signed)
Patients port flushed without difficulty.  Good blood return noted with no bruising or swelling noted at site.  Band aid applied.  VSS with discharge and left in satisfactory condition with no s/s of distress noted.

## 2021-09-01 ENCOUNTER — Encounter (HOSPITAL_COMMUNITY): Payer: Medicare Other | Admitting: Dietician

## 2021-09-01 ENCOUNTER — Telehealth (HOSPITAL_COMMUNITY): Payer: Self-pay | Admitting: Dietician

## 2021-09-01 NOTE — Telephone Encounter (Signed)
Nutrition Assessment   Reason for Assessment: poor appetite   ASSESSMENT: 65 year old male with colorectal cancer. He completed Xeloda and XRT 9/28-11/4. Patient is currently receiving FOLFOX.   Past medical history includes anxiety, DM, gout, HTN, HLD, vertigo, sleep apnea.   Spoke with patient via telephone, He reports increased fatigue and cold sensitivity after last chemotherapy. Patient reports he was able to drink something cold by Sunday after Friday pump disconnect. Reports appetite has been "pretty good." Has been eating a lot of chicken, bologna sandwiches with cheese, oatmeal with banana, fruit cocktail. Patient "drinks plenty of water" He reports having a hard time figuring what foods are going to trigger his diarrhea. Before chemo he was eating 3-4 chicken, honey, and egg omelette every morning for breakfast. He can not stand to look at eggs now. He has tried oatmeal, but this caused "bad episode" of diarrhea. He loves fruit cake, reports recently buying 4 of them at the grocery store and eating a pound of cake every day. Patient became constipated, reports having abdominal pain and advised to take Colace by PA. It took a few days, but finally had relief on Sunday.   Nutrition Focused Physical Exam: unable to complete   Medications: bentyl, klor-con, fish oil, folvite, gabapentin, melatonin, imodium, leucovorin calcium, norco, B12, compazine   Labs: 12/14 - glucose 156, BUN 34, Cr 1.27  Anthropometrics: Last weight 284 lb 9.6 oz on 12/14 increased from 276 lb 8 oz on 12/7  Height: 5'10" Weight: 129.1 kg  UBW: 281 lb (02/10/21) BMI: 40.84   NUTRITION DIAGNOSIS: Food and nutrition related knowledge deficit related to cancer and associated treatment side effects as evidenced by no prior need for related nutrition information   INTERVENTION:  Encouraged small frequent meals and snacks with adequate calories and protein - handout to be provided  Discussed strategies for  diarrhea, foods to include and foods to limit/avoid - handout to be provided Continue taking imodium as needed for diarrhea Suggested oral nutrition supplement with decreased appetite, will leave Ensure samples for pt to try  Will leave Ensure samples, coupons, handouts, and contact information in bag at registration desk for pick up on 11/28 (pt aware)   MONITORING, EVALUATION, GOAL: Patient will tolerate adequate calories and protein to maintain weight/strength   Next Visit: To be scheduled as needed, patient encouraged to contact with nutrition questions or concerns

## 2021-09-06 NOTE — Progress Notes (Signed)
[place], [place] [address]   CLINIC:  Medical Oncology/Hematology  PCP:  Neale Burly, MD [place] / [place] [address] [phone]   REASON FOR VISIT:  Follow-up for rectal cancer  PRIOR THERAPY: Capecitabine + XRT  NGS Results: not done  CURRENT THERAPY: FOLFOX q14d x 4 months (started 08/10/21)  BRIEF ONCOLOGIC HISTORY:  Oncology History  Rectal cancer ([place])  05/01/2021 Initial Diagnosis   Rectal adenocarcinoma ([place])   05/30/2021 - 05/30/2021 Chemotherapy   Patient is on Treatment Plan : COLORECTAL Capecitabine + XRT     08/10/2021 -  Chemotherapy   Patient is on Treatment Plan : COLORECTAL FOLFOX q14d x 4 months       CANCER STAGING:  Cancer Staging  Rectal cancer ([place]) Staging form: Colon and Rectum, AJCC 8th Edition - Clinical stage from 05/18/2021: Stage IIIB (cT2, cN2a, cM0) - Unsigned   INTERVAL HISTORY:  Louis House, a 65 y.o. male, returns for routine follow-up and consideration for next cycle of chemotherapy. Dozier was last seen on 08/24/2021.  Due for cycle #3 of FOLFOX today.   Overall, he tells me he has been feeling pretty well. He reports 2 days of abdominal pain due to constipation which began to improve on 12/26. His energy has improved. He takes potassium once daily. He denies mouth sores, sores on his palms and soles, redness in his palms and soles. He reports tingling in his hands when touching cold objects lasting 4 days after treatment; he denies tingling his feet.   Overall, he feels ready for next cycle of chemo today.    REVIEW OF SYSTEMS:  Review of Systems  Constitutional:  Negative for appetite change and fatigue.  HENT:   Negative for mouth sores.   Gastrointestinal:  Positive for abdominal pain (with constipation) and constipation.  Neurological:  Positive for numbness (tingling in hands).  All other systems reviewed and are negative.  PAST MEDICAL/SURGICAL HISTORY:   Past Medical History:  Diagnosis Date   Anxiety    Diabetes ([place]) 01/12/2017   Diarrhea 02/21/2017   Essential hypertension, benign 01/12/2017   Gout    High cholesterol 01/12/2017   Neuropathy    Port-A-Cath in place 07/28/2021   Sleep apnea    Vertigo    Past Surgical History:  Procedure Laterality Date   BIOPSY  04/06/2017   Procedure: BIOPSY;  Surgeon: Rogene Houston, MD;  Location[facility];  Service: Endoscopy;;  colon   BIOPSY  04/12/2021   Procedure: BIOPSY;  Surgeon: Montez Morita, Quillian Quince, MD;  Location: [facility];  Service: Gastroenterology;;  nodular area in ascending colon biopsy random colon bx   bone spurs     CATARACT EXTRACTION W/PHACO Left 04/22/2018   Procedure: CATARACT EXTRACTION PHACO AND INTRAOCULAR LENS PLACEMENT LEFT EYE;  Surgeon: Tonny Branch, MD;  Location: [facility];  Service: Ophthalmology;  Laterality: Left;  CDE: 7.58   COLONOSCOPY WITH PROPOFOL N/A 04/06/2017   Procedure: COLONOSCOPY WITH PROPOFOL;  Surgeon: Rogene Houston, MD;  [facility]: [facility];  Service: Endoscopy;  Laterality: N/A;  1:45   COLONOSCOPY WITH PROPOFOL N/A 04/12/2021   Procedure: COLONOSCOPY WITH PROPOFOL;  Surgeon: Harvel Quale, MD;  [facility]: [facility];  Service: Gastroenterology;  Laterality: N/A;  9:05   FLEXIBLE SIGMOIDOSCOPY N/A 05/10/2021   Procedure: FLEXIBLE SIGMOIDOSCOPY;  Surgeon: Harvel Quale, MD;  [facility]: [facility];  Service: Gastroenterology;  Laterality:  N/A;  9:50 ASA 1 Per Dr C no Anesthesia, no PAT - ok per Cecilio Asper arthroscopy     both knees   Left shoulder surgery from injury     PORTACATH PLACEMENT Left 08/08/2021   Procedure: INSERTION PORT-A-CATH;  Surgeon: Aviva Signs, MD;  Location: [facility];  Service: General;  Laterality: Left;   Umblical hernia      SOCIAL HISTORY:  Social History   Socioeconomic History   Marital status: Divorced    Spouse name: Not on file   Number of children: Not on file   Years  of education: Not on file   Highest education level: Not on file  Occupational History   Not on file  Tobacco Use   Smoking status: Never   Smokeless tobacco: Never  Vaping Use   Vaping Use: Never used  Substance and Sexual Activity   Alcohol use: No   Drug use: No   Sexual activity: Yes    Birth control/protection: None  Other Topics Concern   Not on file  Social History Narrative   Not on file   Social Determinants of Health   Financial Resource Strain: Low Risk    Difficulty of Paying Living Expenses: Not very hard  Food Insecurity: No Food Insecurity   Worried About Running Out of Food in the Last Year: Never true   Ran Out of Food in the Last Year: Never true  Transportation Needs: No Transportation Needs   Lack of Transportation (Medical): No   Lack of Transportation (Non-Medical): No  Physical Activity: Insufficiently Active   Days of Exercise per Week: 2 days   Minutes of Exercise per Session: 20 min  Stress: No Stress Concern Present   Feeling of Stress : Not at all  Social Connections: Socially Isolated   Frequency of Communication with Friends and Family: More than three times a week   Frequency of Social Gatherings with Friends and Family: More than three times a week   Attends Religious Services: Never   Marine scientist or Organizations: No   Attends Music therapist: Never   Marital Status: Divorced  Human resources officer Violence: Not At Risk   Fear of Current or Ex-Partner: No   Emotionally Abused: No   Physically Abused: No   Sexually Abused: No    FAMILY HISTORY:  No family history on file.  CURRENT MEDICATIONS:  Current Outpatient Medications  Medication Sig Dispense Refill   alfuzosin (UROXATRAL) 10 MG 24 hr tablet Take 10 mg by mouth at bedtime. 2200     allopurinol (ZYLOPRIM) 300 MG tablet Take 300 mg by mouth daily.     atorvastatin (LIPITOR) 40 MG tablet Take 40 mg by mouth daily.     benazepril (LOTENSIN) 40 MG tablet  Take 40 mg by mouth daily.  0   busPIRone (BUSPAR) 15 MG tablet Take 15 mg by mouth 2 (two) times daily.     chlorthalidone (HYGROTON) 50 MG tablet Take 50 mg by mouth daily.     colestipol (COLESTID) 1 g tablet Take 4 tablets (4 g total) by mouth daily. Take 4 hours apart form your other medications 360 tablet 3   Cyanocobalamin (VITAMIN B-12) 5000 MCG SUBL Place 15,000 mcg under the tongue daily.     diazepam (VALIUM) 10 MG tablet Take 10 mg by mouth at bedtime.      diclofenac (VOLTAREN) 75 MG EC tablet Take 75 mg by mouth 2 (two) times daily.  DULoxetine (CYMBALTA) 20 MG capsule Take 20 mg by mouth daily.     DULoxetine (CYMBALTA) 30 MG capsule Take 30 mg by mouth daily as needed.     fluorouracil CALGB 11941 2,400 mg/m2 in sodium chloride 0.9 % 150 mL Inject 2,400 mg/m2 into the vein over 48 hr.     FLUOROURACIL IV Inject 400 mg/m2 into the vein every 14 (fourteen) days.     fluticasone (FLONASE) 50 MCG/ACT nasal spray Place 3-4 sprays into both nostrils daily.     folic acid (FOLVITE) 1 MG tablet folic acid 1 mg tablet  TAKE 1 TABLET BY MOUTH DAILY     gabapentin (NEURONTIN) 400 MG capsule Take 800 mg by mouth 2 (two) times daily. 400-800 mg as needed for pain     HYDROcodone-acetaminophen (NORCO) 10-325 MG tablet Take 1 tablet by mouth 3 (three) times daily.     hydrOXYzine (ATARAX) 25 MG tablet      LEUCOVORIN CALCIUM IV Inject 400 mg/m2 into the vein every 14 (fourteen) days.     lidocaine-prilocaine (EMLA) cream Apply a small amount to port a cath site (do not rub in) and cover with plastic wrap 1 hour prior to chemotherapy appointments 30 g 3   loperamide (IMODIUM) 2 MG capsule Take 2 mg by mouth as needed. Use on schedule per Dr. Laural Golden.     Melatonin 10 MG CAPS Take 30 mg by mouth at bedtime.     metoprolol succinate (TOPROL-XL) 100 MG 24 hr tablet Take 100 mg by mouth daily.     naloxone (NARCAN) nasal spray 4 mg/0.1 mL Narcan 4 mg/actuation nasal spray  Take 1 spray as needed  by nasal route as directed.     Naphazoline-Pheniramine (OPCON-A) 0.027-0.315 % SOLN Place 1 drop into both eyes daily as needed (irritation).     Omega-3 Fatty Acids (OMEGA-3 FISH OIL PO) Take 2,160 mg by mouth daily. 720 mg per cap     OXALIPLATIN IV Inject 85 mg/m2 into the vein every 14 (fourteen) days.     oxymetazoline (AFRIN) 0.05 % nasal spray Place 3 sprays into both nostrils 2 (two) times daily.     potassium chloride SA (KLOR-CON M) 20 MEQ tablet Take 1 tablet (20 mEq total) by mouth daily. 30 tablet 3   prochlorperazine (COMPAZINE) 10 MG tablet Take 1 tablet (10 mg total) by mouth in the morning and at bedtime. Take 51mn before chemotherapy 60 tablet 3   sulfaSALAzine (AZULFIDINE) 500 MG tablet sulfasalazine 500 mg tablet  TAKE 2 TABLETS BY MOUTH TWICE DAILY     tizanidine (ZANAFLEX) 2 MG capsule Take 2 mg by mouth 3 (three) times daily as needed for muscle spasms.     traZODone (DESYREL) 50 MG tablet Take 50 mg by mouth at bedtime.     diphenoxylate-atropine (LOMOTIL) 2.5-0.025 MG tablet Take 1 tablet by mouth 4 (four) times daily as needed for diarrhea or loose stools. (Patient not taking: Reported on 09/07/2021) 30 tablet 2   loratadine (CLARITIN) 10 MG tablet Take 10 mg by mouth daily as needed for allergies (Seasonal). (Patient not taking: Reported on 09/07/2021)     meclizine (ANTIVERT) 25 MG tablet Take 25 mg by mouth 3 (three) times daily as needed for dizziness (for flare up of inner ear issues.). (Patient not taking: Reported on 09/07/2021)     Current Facility-Administered Medications  Medication Dose Route Frequency Provider Last Rate Last Admin   dicyclomine (BENTYL) tablet 20 mg  20 mg Oral TID  AC & HS Setzer, Terri L, NP        ALLERGIES:  No Known Allergies  PHYSICAL EXAM:  Performance status (ECOG): 0 - Asymptomatic  Vitals:   09/07/21 0852  BP: (!) 124/91  Pulse: 90  Resp: 18  Temp: 98.1 F (36.7 C)  SpO2: 100%   Wt Readings from Last 3 Encounters:   09/07/21 279 lb 14.4 oz (127 kg)  08/24/21 284 lb 9.6 oz (129.1 kg)  08/17/21 276 lb 8 oz (125.4 kg)   Physical Exam Vitals reviewed.  Constitutional:      Appearance: Normal appearance. He is obese.  Cardiovascular:     Rate and Rhythm: Normal rate and regular rhythm.     Pulses: Normal pulses.     Heart sounds: Normal heart sounds.  Pulmonary:     Effort: Pulmonary effort is normal.     Breath sounds: Normal breath sounds.  Neurological:     General: No focal deficit present.     Mental Status: He is alert and oriented to person, place, and time.  Psychiatric:        Mood and Affect: Mood normal.        Behavior: Behavior normal.    LABORATORY DATA:  I have reviewed the labs as listed.  CBC Latest Ref Rng & Units 09/07/2021 08/24/2021 08/17/2021  WBC 4.0 - 10.5 K/uL 6.3 5.9 4.6  Hemoglobin 13.0 - 17.0 g/dL 11.2(L) 10.0(L) 11.5(L)  Hematocrit 39.0 - 52.0 % 32.8(L) 30.0(L) 32.2(L)  Platelets 150 - 400 K/uL 173 192 282   CMP Latest Ref Rng & Units 09/07/2021 08/24/2021 08/17/2021  Glucose 70 - 99 mg/dL 193(H) 156(H) 157(H)  BUN 8 - 23 mg/dL 24(H) 34(H) 39(H)  Creatinine 0.61 - 1.24 mg/dL 1.21 1.27(H) 1.33(H)  Sodium 135 - 145 mmol/L 134(L) 135 133(L)  Potassium 3.5 - 5.1 mmol/L 4.0 3.8 3.7  Chloride 98 - 111 mmol/L 98 99 95(L)  CO2 22 - 32 mmol/L 29 28 29   Calcium 8.9 - 10.3 mg/dL 9.1 9.0 9.6  Total Protein 6.5 - 8.1 g/dL 6.6 6.5 6.7  Total Bilirubin 0.3 - 1.2 mg/dL 0.4 0.4 0.4  Alkaline Phos 38 - 126 U/L 65 57 51  AST 15 - 41 U/L 34 25 15  ALT 0 - 44 U/L 50(H) 26 17    DIAGNOSTIC IMAGING:  I have independently reviewed the scans and discussed with the patient. DG Chest Port 1 View  Result Date: 08/08/2021 CLINICAL DATA:  Left-side Port-A-Cath placement EXAM: PORTABLE CHEST 1 VIEW COMPARISON:  05/13/2021 FINDINGS: Interval placement of left subclavian port a catheter with tip in the projection of the SVC. No pneumothorax identified. Stable cardiomediastinal  contours. Lung volumes are low. IMPRESSION: Status post left subclavian port a catheter placement without pneumothorax. Electronically Signed   By: Kerby Moors M.D.   On: 08/08/2021 10:55     ASSESSMENT:  Stage III (T2N2) rectal cancer, MSI stable: - He reported diarrhea which started in May.  Prior colonoscopy was in 2018. - Colonoscopy on 04/12/2021 showed fungating polypoid nonobstructing mass with central depression found at 8 cm from the anal verge.  Mass was noncircumferential, measured 2 cm in length.  Nodular mucosa in the proximal ascending colon which was biopsied. - Pathology of the rectal mass consistent with adenocarcinoma.  Nodular area in the ascending colon was consistent with collagenous colitis.  MMR preserved.  MSI stable. - CT CAP on 05/13/2021 with no evidence of metastatic disease in the chest, abdomen or  pelvis.  Mild hepatic steatosis. - Pelvic MRI on 05/12/2021-T2N2 by MRI staging.  4 small lymph nodes 5 mm or greater. - Total neoadjuvant therapy was recommended on discussion with Dr. Marcello Moores. - Xeloda and XRT from 06/08/2021 through 07/15/2021. - FOLFOX started on 08/10/2021.  2.  Social/family history: - He lives at home by himself.  Daughter lives in [place]. - He worked at [employer] in [place] prior to retirement.  Non-smoker. - Sister had lung cancer and was a smoker.  No family history of colon cancer.   PLAN:  Stage III (T2N2) rectal adenocarcinoma, MSI stable: - He has tolerated last cycle of FOLFOX reasonably well.  He developed constipation after last treatment which resolved. - He had tingling in the hands from cold sensitivity lasted up to 4 days. - Reviewed labs today which showed mildly elevated ALT of 50.  Rest of the LFTs are normal.  CBC normal white count and platelet count.  Last CEA was 2.3. - Proceed with her next treatment today.  RTC 2 weeks for follow-up with repeat labs.   2.  Normocytic anemia: - Combination anemia from CKD and  relative iron deficiency. - Hemoglobin today is 11.2.  Ferritin was 58 and percent saturation 17 on 08/24/2021.  3.  Diarrhea: - Continue colestipol 4 g daily. - He has some constipation and talked to our dietitian.  He had made dietary changes.  4.  Chronic back pain: - Continue hydrocodone 10/325 daily as needed.  5.  Hypokalemia: - Potassium is 4.0.  Continue potassium 20 mEq daily.   Orders placed this encounter:  No orders of the defined types were placed in this encounter.    Derek Jack, MD [place] [phone]   I, Thana Ates, am acting as a scribe for Dr. Derek Jack.  I, Derek Jack MD, have reviewed the above documentation for accuracy and completeness, and I agree with the above.

## 2021-09-07 ENCOUNTER — Inpatient Hospital Stay (HOSPITAL_BASED_OUTPATIENT_CLINIC_OR_DEPARTMENT_OTHER): Payer: Medicare Other | Admitting: Hematology

## 2021-09-07 ENCOUNTER — Other Ambulatory Visit: Payer: Self-pay

## 2021-09-07 ENCOUNTER — Inpatient Hospital Stay (HOSPITAL_COMMUNITY): Payer: Medicare Other

## 2021-09-07 VITALS — BP 124/91 | HR 90 | Temp 98.1°F | Resp 18 | Ht 70.0 in | Wt 279.9 lb

## 2021-09-07 VITALS — BP 117/70 | HR 69 | Temp 97.2°F | Resp 17

## 2021-09-07 DIAGNOSIS — D539 Nutritional anemia, unspecified: Secondary | ICD-10-CM

## 2021-09-07 DIAGNOSIS — D520 Dietary folate deficiency anemia: Secondary | ICD-10-CM

## 2021-09-07 DIAGNOSIS — Z95828 Presence of other vascular implants and grafts: Secondary | ICD-10-CM

## 2021-09-07 DIAGNOSIS — C20 Malignant neoplasm of rectum: Secondary | ICD-10-CM

## 2021-09-07 DIAGNOSIS — Z5111 Encounter for antineoplastic chemotherapy: Secondary | ICD-10-CM | POA: Diagnosis not present

## 2021-09-07 LAB — COMPREHENSIVE METABOLIC PANEL
ALT: 50 U/L — ABNORMAL HIGH (ref 0–44)
AST: 34 U/L (ref 15–41)
Albumin: 4 g/dL (ref 3.5–5.0)
Alkaline Phosphatase: 65 U/L (ref 38–126)
Anion gap: 7 (ref 5–15)
BUN: 24 mg/dL — ABNORMAL HIGH (ref 8–23)
CO2: 29 mmol/L (ref 22–32)
Calcium: 9.1 mg/dL (ref 8.9–10.3)
Chloride: 98 mmol/L (ref 98–111)
Creatinine, Ser: 1.21 mg/dL (ref 0.61–1.24)
GFR, Estimated: >60 mL/min (ref >60)
Glucose, Bld: 193 mg/dL — ABNORMAL HIGH (ref 70–99)
Potassium: 4 mmol/L (ref 3.5–5.1)
Sodium: 134 mmol/L — ABNORMAL LOW (ref 135–145)
Total Bilirubin: 0.4 mg/dL (ref 0.3–1.2)
Total Protein: 6.6 g/dL (ref 6.5–8.1)

## 2021-09-07 LAB — CBC WITH DIFFERENTIAL/PLATELET
Abs Immature Granulocytes: 0.23 10*3/uL — ABNORMAL HIGH (ref 0.00–0.07)
Basophils Absolute: 0.1 10*3/uL (ref 0.0–0.1)
Basophils Relative: 1 %
Eosinophils Absolute: 0.2 10*3/uL (ref 0.0–0.5)
Eosinophils Relative: 3 %
HCT: 32.8 % — ABNORMAL LOW (ref 39.0–52.0)
Hemoglobin: 11.2 g/dL — ABNORMAL LOW (ref 13.0–17.0)
Immature Granulocytes: 4 %
Lymphocytes Relative: 8 %
Lymphs Abs: 0.5 10*3/uL — ABNORMAL LOW (ref 0.7–4.0)
MCH: 33.8 pg (ref 26.0–34.0)
MCHC: 34.1 g/dL (ref 30.0–36.0)
MCV: 99.1 fL (ref 80.0–100.0)
Monocytes Absolute: 0.5 10*3/uL (ref 0.1–1.0)
Monocytes Relative: 8 %
Neutro Abs: 4.8 10*3/uL (ref 1.7–7.7)
Neutrophils Relative %: 76 %
Platelets: 173 10*3/uL (ref 150–400)
RBC: 3.31 MIL/uL — ABNORMAL LOW (ref 4.22–5.81)
RDW: 15.9 % — ABNORMAL HIGH (ref 11.5–15.5)
WBC: 6.3 10*3/uL (ref 4.0–10.5)
nRBC: 0.5 % — ABNORMAL HIGH (ref 0.0–0.2)

## 2021-09-07 LAB — MAGNESIUM: Magnesium: 1.9 mg/dL (ref 1.7–2.4)

## 2021-09-07 MED ORDER — DEXTROSE 5 % IV SOLN: Freq: Once | INTRAVENOUS | Status: AC

## 2021-09-07 MED ORDER — SODIUM CHLORIDE 0.9% FLUSH
10.0000 mL | INTRAVENOUS | Status: DC | PRN
  Administered 2021-09-07: 09:00:00 10 mL

## 2021-09-07 MED ORDER — PALONOSETRON HCL INJECTION 0.25 MG/5ML
0.2500 mg | Freq: Once | INTRAVENOUS | Status: AC
  Administered 2021-09-07: 11:00:00 0.25 mg via INTRAVENOUS
  Filled 2021-09-07: qty 5

## 2021-09-07 MED ORDER — SODIUM CHLORIDE 0.9 % IV SOLN
2400.0000 mg/m2 | INTRAVENOUS | Status: DC
  Administered 2021-09-07: 16:00:00 6150 mg via INTRAVENOUS
  Filled 2021-09-07: qty 123

## 2021-09-07 MED ORDER — SODIUM CHLORIDE 0.9 % IV SOLN
10.0000 mg | Freq: Once | INTRAVENOUS | Status: AC
  Administered 2021-09-07: 11:00:00 10 mg via INTRAVENOUS
  Filled 2021-09-07: qty 1

## 2021-09-07 MED ORDER — LEUCOVORIN CALCIUM INJECTION 500 MG
389.0000 mg/m2 | Freq: Once | INTRAVENOUS | Status: AC
  Administered 2021-09-07: 13:00:00 1000 mg via INTRAVENOUS
  Filled 2021-09-07: qty 50

## 2021-09-07 MED ORDER — FLUOROURACIL CHEMO INJECTION 2.5 GM/50ML
400.0000 mg/m2 | Freq: Once | INTRAVENOUS | Status: AC
  Administered 2021-09-07: 1050 mg via INTRAVENOUS
  Filled 2021-09-07: qty 21

## 2021-09-07 MED ORDER — OXALIPLATIN CHEMO INJECTION 100 MG/20ML
85.0000 mg/m2 | Freq: Once | INTRAVENOUS | Status: AC
  Administered 2021-09-07: 13:00:00 220 mg via INTRAVENOUS
  Filled 2021-09-07: qty 34

## 2021-09-07 NOTE — Progress Notes (Signed)
Patient has been examined by Dr. Delton Coombes, and vital signs and labs have been reviewed. ANC, Creatinine, LFTs, hemoglobin, and platelets are within treatment parameters per M.D. - pt may proceed with treatment.

## 2021-09-07 NOTE — Progress Notes (Signed)
Patient presents today for FOLFOX, okay to proceed with treatment per Dr. Delton Coombes. Patient tolerated chemotherapy with no complaints voiced. Side effects with management reviewed understanding verbalized. Port site clean and dry with no bruising or swelling noted at site. Good blood return noted before and after administration of chemotherapy. Chemo pump connected with no alarms noted. Patient left in satisfactory condition with VSS and no s/s of distress noted.

## 2021-09-07 NOTE — Patient Instructions (Signed)
[place] at [facility] Discharge Instructions   You were seen and examined today by Dr. Delton Coombes. We will proceed with treatment today - all of your lab work is good. Return as scheduled.    Thank you for choosing [place] at [facility] to provide your oncology and hematology care.  To afford each patient quality time with our provider, please arrive at least 15 minutes before your scheduled appointment time.   If you have a lab appointment with the [place] please come in thru the Main Entrance and check in at the main information desk.  You need to re-schedule your appointment should you arrive 10 or more minutes late.  We strive to give you quality time with our providers, and arriving late affects you and other patients whose appointments are after yours.  Also, if you no show three or more times for appointments you may be dismissed from the clinic at the providers discretion.     Again, thank you for choosing [facility].  Our hope is that these requests will decrease the amount of time that you wait before being seen by our physicians.       _____________________________________________________________  Should you have questions after your visit to [facility], please contact our office at [phone] and follow the prompts.  Our office hours are 8:00 a.m. and 4:30 p.m. Monday - Friday.  Please note that voicemails left after 4:00 p.m. may not be returned until the following business day.  We are closed weekends and major holidays.  You do have access to a nurse 24-7, just call the main number to the clinic [phone] and do not press any options, hold on the line and a nurse will answer the phone.    For prescription refill requests, have your pharmacy contact our office and allow 72 hours.    Due to Covid, you will need to wear a mask upon entering the hospital. If you do not have a mask, a mask  will be given to you at the Main Entrance upon arrival. For doctor visits, patients may have 1 support person age 16 or older with them. For treatment visits, patients can not have anyone with them due to social distancing guidelines and our immunocompromised population.

## 2021-09-07 NOTE — Patient Instructions (Signed)
[place]  Discharge Instructions: Thank you for choosing [place] to provide your oncology and hematology care.  If you have a lab appointment with the [place], please come in thru the Main Entrance and check in at the main information desk.  Wear comfortable clothing and clothing appropriate for easy access to any Portacath or PICC line.   We strive to give you quality time with your provider. You may need to reschedule your appointment if you arrive late (15 or more minutes).  Arriving late affects you and other patients whose appointments are after yours.  Also, if you miss three or more appointments without notifying the office, you may be dismissed from the clinic at the providers discretion.      For prescription refill requests, have your pharmacy contact our office and allow 72 hours for refills to be completed.    Today you received the following chemotherapy and/or immunotherapy agents Folfox, return as scheduled.   To help prevent nausea and vomiting after your treatment, we encourage you to take your nausea medication as directed.  BELOW ARE SYMPTOMS THAT SHOULD BE REPORTED IMMEDIATELY: *FEVER GREATER THAN 100.4 F (38 C) OR HIGHER *CHILLS OR SWEATING *NAUSEA AND VOMITING THAT IS NOT CONTROLLED WITH YOUR NAUSEA MEDICATION *UNUSUAL SHORTNESS OF BREATH *UNUSUAL BRUISING OR BLEEDING *URINARY PROBLEMS (pain or burning when urinating, or frequent urination) *BOWEL PROBLEMS (unusual diarrhea, constipation, pain near the anus) TENDERNESS IN MOUTH AND THROAT WITH OR WITHOUT PRESENCE OF ULCERS (sore throat, sores in mouth, or a toothache) UNUSUAL RASH, SWELLING OR PAIN  UNUSUAL VAGINAL DISCHARGE OR ITCHING   Items with * indicate a potential emergency and should be followed up as soon as possible or go to the Emergency Department if any problems should occur.  Please show the CHEMOTHERAPY ALERT CARD or IMMUNOTHERAPY ALERT CARD at check-in to the  Emergency Department and triage nurse.  Should you have questions after your visit or need to cancel or reschedule your appointment, please contact [facility] [phone]  and follow the prompts.  Office hours are 8:00 a.m. to 4:30 p.m. Monday - Friday. Please note that voicemails left after 4:00 p.m. may not be returned until the following business day.  We are closed weekends and major holidays. You have access to a nurse at all times for urgent questions. Please call the main number to the clinic [phone] and follow the prompts.  For any non-urgent questions, you may also contact your provider using MyChart. We now offer e-Visits for anyone 67 and older to request care online for non-urgent symptoms. For details visit [email or web address].   Also download the MyChart app! Go to the app store, search "MyChart", open the app, select [place], and log in with your MyChart username and password.  Due to Covid, a mask is required upon entering the hospital/clinic. If you do not have a mask, one will be given to you upon arrival. For doctor visits, patients may have 1 support person aged 38 or older with them. For treatment visits, patients cannot have anyone with them due to current Covid guidelines and our immunocompromised population.

## 2021-09-08 LAB — CEA: CEA: 3.4 ng/mL (ref 0.0–4.7)

## 2021-09-09 ENCOUNTER — Other Ambulatory Visit: Payer: Self-pay

## 2021-09-09 ENCOUNTER — Inpatient Hospital Stay (HOSPITAL_COMMUNITY): Payer: Medicare Other

## 2021-09-09 VITALS — BP 118/68 | HR 71 | Temp 96.6°F | Resp 18

## 2021-09-09 DIAGNOSIS — C20 Malignant neoplasm of rectum: Secondary | ICD-10-CM

## 2021-09-09 DIAGNOSIS — Z95828 Presence of other vascular implants and grafts: Secondary | ICD-10-CM

## 2021-09-09 DIAGNOSIS — Z5111 Encounter for antineoplastic chemotherapy: Secondary | ICD-10-CM | POA: Diagnosis not present

## 2021-09-09 MED ORDER — HEPARIN SOD (PORK) LOCK FLUSH 100 UNIT/ML IV SOLN
500.0000 [IU] | Freq: Once | INTRAVENOUS | Status: AC | PRN
  Administered 2021-09-09: 14:00:00 500 [IU]

## 2021-09-09 MED ORDER — SODIUM CHLORIDE 0.9% FLUSH
10.0000 mL | INTRAVENOUS | Status: DC | PRN
  Administered 2021-09-09: 14:00:00 10 mL

## 2021-09-19 ENCOUNTER — Other Ambulatory Visit (HOSPITAL_COMMUNITY): Payer: Self-pay

## 2021-09-19 MED ORDER — AMOXICILLIN 500 MG PO CAPS: 500.0000 mg | ORAL_CAPSULE | Freq: Three times a day (TID) | ORAL | 0 refills | Status: AC

## 2021-09-19 NOTE — Telephone Encounter (Signed)
Patient called reporting sinus congestion. No fever, no known Covid or flu exposure. Patient reports thick yellow-green mucus and persistent cough. Dr. Delton Coombes made aware. Next treatment delayed and patient prescribed Amoxicillin 56m TID for 7 days. Patient verbalized understanding.

## 2021-09-21 ENCOUNTER — Other Ambulatory Visit (HOSPITAL_COMMUNITY): Payer: Medicare Other

## 2021-09-21 ENCOUNTER — Ambulatory Visit (HOSPITAL_COMMUNITY): Payer: Medicare Other | Admitting: Hematology

## 2021-09-21 ENCOUNTER — Ambulatory Visit (HOSPITAL_COMMUNITY): Payer: Medicare Other

## 2021-09-23 ENCOUNTER — Encounter (HOSPITAL_COMMUNITY): Payer: Medicare Other

## 2021-09-27 ENCOUNTER — Other Ambulatory Visit (HOSPITAL_COMMUNITY): Payer: Self-pay

## 2021-09-27 ENCOUNTER — Other Ambulatory Visit: Payer: Self-pay

## 2021-09-27 ENCOUNTER — Ambulatory Visit (HOSPITAL_COMMUNITY)
Admission: RE | Admit: 2021-09-27 | Discharge: 2021-09-27 | Disposition: A | Discharge status: Home / Self Care | Payer: Medicare Other | Source: Ambulatory Visit | Attending: Hematology | Admitting: Hematology

## 2021-09-27 DIAGNOSIS — R062 Wheezing: Secondary | ICD-10-CM

## 2021-09-27 DIAGNOSIS — R053 Chronic cough: Secondary | ICD-10-CM | POA: Diagnosis not present

## 2021-09-27 MED ORDER — LEVOFLOXACIN 500 MG PO TABS: 500.0000 mg | ORAL_TABLET | Freq: Every day | ORAL | 0 refills | Status: DC

## 2021-09-27 NOTE — Telephone Encounter (Signed)
Order received for 52m daily x7days of Levaquin from Dr. KDelton Coombes Patient made aware and agreeable.

## 2021-09-27 NOTE — Progress Notes (Signed)
Patient called reporting that he has completed his course of antibiotics but still has a persistent cough and wheezing. Order received from Dr. Delton Coombes for CXR. Patient aware and will come present to radiology today for CXR.

## 2021-09-28 ENCOUNTER — Ambulatory Visit (HOSPITAL_COMMUNITY): Payer: Medicare Other | Admitting: Hematology

## 2021-09-28 ENCOUNTER — Ambulatory Visit (HOSPITAL_COMMUNITY): Payer: Medicare Other

## 2021-09-28 ENCOUNTER — Inpatient Hospital Stay (HOSPITAL_COMMUNITY): Payer: Medicare Other

## 2021-09-30 ENCOUNTER — Encounter (HOSPITAL_COMMUNITY): Payer: Medicare Other

## 2021-10-05 ENCOUNTER — Inpatient Hospital Stay (HOSPITAL_COMMUNITY): Payer: Medicare Other

## 2021-10-05 ENCOUNTER — Inpatient Hospital Stay (HOSPITAL_COMMUNITY): Payer: Medicare Other | Attending: Hematology

## 2021-10-05 ENCOUNTER — Other Ambulatory Visit (HOSPITAL_COMMUNITY): Payer: Self-pay

## 2021-10-05 ENCOUNTER — Other Ambulatory Visit: Payer: Self-pay

## 2021-10-05 ENCOUNTER — Ambulatory Visit (HOSPITAL_COMMUNITY): Payer: Medicare Other

## 2021-10-05 ENCOUNTER — Ambulatory Visit (HOSPITAL_COMMUNITY): Payer: Medicare Other | Admitting: Hematology

## 2021-10-05 ENCOUNTER — Inpatient Hospital Stay (HOSPITAL_BASED_OUTPATIENT_CLINIC_OR_DEPARTMENT_OTHER): Payer: Medicare Other | Admitting: Hematology

## 2021-10-05 ENCOUNTER — Other Ambulatory Visit (HOSPITAL_COMMUNITY): Payer: Medicare Other

## 2021-10-05 VITALS — BP 117/86 | HR 80 | Temp 97.5°F | Resp 18

## 2021-10-05 DIAGNOSIS — C20 Malignant neoplasm of rectum: Secondary | ICD-10-CM

## 2021-10-05 DIAGNOSIS — Z95828 Presence of other vascular implants and grafts: Secondary | ICD-10-CM

## 2021-10-05 DIAGNOSIS — E876 Hypokalemia: Secondary | ICD-10-CM | POA: Insufficient documentation

## 2021-10-05 DIAGNOSIS — Z5111 Encounter for antineoplastic chemotherapy: Secondary | ICD-10-CM | POA: Insufficient documentation

## 2021-10-05 LAB — CBC WITH DIFFERENTIAL/PLATELET
Abs Immature Granulocytes: 0.26 10*3/uL — ABNORMAL HIGH (ref 0.00–0.07)
Basophils Absolute: 0.1 10*3/uL (ref 0.0–0.1)
Basophils Relative: 1 %
Eosinophils Absolute: 0.1 10*3/uL (ref 0.0–0.5)
Eosinophils Relative: 1 %
HCT: 30.8 % — ABNORMAL LOW (ref 39.0–52.0)
Hemoglobin: 10.4 g/dL — ABNORMAL LOW (ref 13.0–17.0)
Immature Granulocytes: 4 %
Lymphocytes Relative: 9 %
Lymphs Abs: 0.6 10*3/uL — ABNORMAL LOW (ref 0.7–4.0)
MCH: 33.5 pg (ref 26.0–34.0)
MCHC: 33.8 g/dL (ref 30.0–36.0)
MCV: 99.4 fL (ref 80.0–100.0)
Monocytes Absolute: 0.7 10*3/uL (ref 0.1–1.0)
Monocytes Relative: 12 %
Neutro Abs: 4.3 10*3/uL (ref 1.7–7.7)
Neutrophils Relative %: 73 %
Platelets: 233 10*3/uL (ref 150–400)
RBC: 3.1 MIL/uL — ABNORMAL LOW (ref 4.22–5.81)
RDW: 15.1 % (ref 11.5–15.5)
WBC: 6 10*3/uL (ref 4.0–10.5)
nRBC: 0 % (ref 0.0–0.2)

## 2021-10-05 LAB — COMPREHENSIVE METABOLIC PANEL
ALT: 17 U/L (ref 0–44)
AST: 19 U/L (ref 15–41)
Albumin: 3.4 g/dL — ABNORMAL LOW (ref 3.5–5.0)
Alkaline Phosphatase: 57 U/L (ref 38–126)
Anion gap: 10 (ref 5–15)
BUN: 32 mg/dL — ABNORMAL HIGH (ref 8–23)
CO2: 30 mmol/L (ref 22–32)
Calcium: 9.9 mg/dL (ref 8.9–10.3)
Chloride: 93 mmol/L — ABNORMAL LOW (ref 98–111)
Creatinine, Ser: 1.63 mg/dL — ABNORMAL HIGH (ref 0.61–1.24)
GFR, Estimated: 46 mL/min — ABNORMAL LOW (ref >60)
Glucose, Bld: 186 mg/dL — ABNORMAL HIGH (ref 70–99)
Potassium: 3.1 mmol/L — ABNORMAL LOW (ref 3.5–5.1)
Sodium: 133 mmol/L — ABNORMAL LOW (ref 135–145)
Total Bilirubin: 0.4 mg/dL (ref 0.3–1.2)
Total Protein: 6.4 g/dL — ABNORMAL LOW (ref 6.5–8.1)

## 2021-10-05 LAB — MAGNESIUM: Magnesium: 1.3 mg/dL — ABNORMAL LOW (ref 1.7–2.4)

## 2021-10-05 MED ORDER — SODIUM CHLORIDE 0.9 % IV SOLN
1920.0000 mg/m2 | INTRAVENOUS | Status: AC
  Administered 2021-10-05: 15:00:00 4950 mg via INTRAVENOUS
  Filled 2021-10-05: qty 99

## 2021-10-05 MED ORDER — SODIUM CHLORIDE 0.9 % IV SOLN: INTRAVENOUS | Status: AC

## 2021-10-05 MED ORDER — MAGNESIUM SULFATE 4 GM/100ML IV SOLN
4.0000 g | Freq: Once | INTRAVENOUS | Status: AC
  Administered 2021-10-05: 13:00:00 4 g via INTRAVENOUS
  Filled 2021-10-05: qty 100

## 2021-10-05 MED ORDER — FLUOROURACIL CHEMO INJECTION 2.5 GM/50ML
320.0000 mg/m2 | Freq: Once | INTRAVENOUS | Status: AC
  Administered 2021-10-05: 15:00:00 800 mg via INTRAVENOUS
  Filled 2021-10-05: qty 16

## 2021-10-05 MED ORDER — HEPARIN SOD (PORK) LOCK FLUSH 100 UNIT/ML IV SOLN: 500.0000 [IU] | Freq: Once | INTRAVENOUS | Status: DC | PRN

## 2021-10-05 MED ORDER — DEXTROSE 5 % IV SOLN: Freq: Once | INTRAVENOUS | Status: AC

## 2021-10-05 MED ORDER — LEUCOVORIN CALCIUM INJECTION 350 MG
320.0000 mg/m2 | Freq: Once | INTRAVENOUS | Status: AC
  Administered 2021-10-05: 13:00:00 822 mg via INTRAVENOUS
  Filled 2021-10-05: qty 41.1

## 2021-10-05 MED ORDER — SODIUM CHLORIDE 0.9 % IV SOLN
10.0000 mg | Freq: Once | INTRAVENOUS | Status: AC
  Administered 2021-10-05: 11:00:00 10 mg via INTRAVENOUS
  Filled 2021-10-05: qty 1

## 2021-10-05 MED ORDER — SODIUM CHLORIDE 0.9% FLUSH: 10.0000 mL | INTRAVENOUS | Status: DC | PRN

## 2021-10-05 MED ORDER — DIPHENOXYLATE-ATROPINE 2.5-0.025 MG PO TABS: 1.0000 | ORAL_TABLET | Freq: Four times a day (QID) | ORAL | 2 refills | Status: DC | PRN

## 2021-10-05 MED ORDER — PALONOSETRON HCL INJECTION 0.25 MG/5ML
0.2500 mg | Freq: Once | INTRAVENOUS | Status: AC
  Administered 2021-10-05: 11:00:00 0.25 mg via INTRAVENOUS
  Filled 2021-10-05: qty 5

## 2021-10-05 MED ORDER — POTASSIUM CHLORIDE CRYS ER 20 MEQ PO TBCR
40.0000 meq | EXTENDED_RELEASE_TABLET | Freq: Once | ORAL | Status: AC
  Administered 2021-10-05: 11:00:00 40 meq via ORAL
  Filled 2021-10-05: qty 2

## 2021-10-05 MED ORDER — OXALIPLATIN CHEMO INJECTION 100 MG/20ML
68.0000 mg/m2 | Freq: Once | INTRAVENOUS | Status: AC
  Administered 2021-10-05: 175 mg via INTRAVENOUS
  Filled 2021-10-05: qty 35

## 2021-10-05 NOTE — Patient Instructions (Addendum)
[place] at [facility] Discharge Instructions  You were seen and examined today by Dr. Delton Coombes. Proceed with treatment today. Dr. Delton Coombes will give you additional Potassium, Magnesium and fluids today in the clinic.  Stop Chlorthalidone due to your dizziness.  Take Lomotil two tablets at the first episode of diarrhea. Take one additional Lomotil after each episode of diarrhea. You can take up to 8 a day.  Return to clinic in 2 weeks for your next treatment, your pump will be disconnected on Friday.   Thank you for choosing [place] at [facility] to provide your oncology and hematology care.  To afford each patient quality time with our provider, please arrive at least 15 minutes before your scheduled appointment time.   If you have a lab appointment with the [place] please come in thru the Main Entrance and check in at the main information desk.  You need to re-schedule your appointment should you arrive 10 or more minutes late.  We strive to give you quality time with our providers, and arriving late affects you and other patients whose appointments are after yours.  Also, if you no show three or more times for appointments you may be dismissed from the clinic at the providers discretion.     Again, thank you for choosing [facility].  Our hope is that these requests will decrease the amount of time that you wait before being seen by our physicians.       _____________________________________________________________  Should you have questions after your visit to [facility], please contact our office at [phone] and follow the prompts.  Our office hours are 8:00 a.m. and 4:30 p.m. Monday - Friday.  Please note that voicemails left after 4:00 p.m. may not be returned until the following business day.  We are closed weekends and major holidays.  You do have access to a nurse 24-7, just call the main  number to the clinic [phone] and do not press any options, hold on the line and a nurse will answer the phone.    For prescription refill requests, have your pharmacy contact our office and allow 72 hours.    Due to Covid, you will need to wear a mask upon entering the hospital. If you do not have a mask, a mask will be given to you at the Main Entrance upon arrival. For doctor visits, patients may have 1 support person age 53 or older with them. For treatment visits, patients can not have anyone with them due to social distancing guidelines and our immunocompromised population.

## 2021-10-05 NOTE — Addendum Note (Signed)
Addended by: Geralynn Ochs on: 10/05/2021 10:08 AM   Modules accepted: Orders

## 2021-10-05 NOTE — Addendum Note (Signed)
Addended by: Geralynn Ochs on: 10/05/2021 10:05 AM   Modules accepted: Orders

## 2021-10-05 NOTE — Progress Notes (Signed)
Patient presents today for FOLFOX. Potassium 3.1, Magnesium 1.3, Ser. Creatinine 1.63, patient okay for treatment per Dr. Delton Coombes. Received orders for 4g of IV Magnesium, 40 mEq of PO potassium and 1 L of normal saline in addition to treatment. See MAR for details. Patient tolerated chemotherapy with no complaints voiced. Side effects with management reviewed understanding verbalized. Port site clean and dry with no bruising or swelling noted at site. Good blood return noted before and after administration of chemotherapy. Chemo pump connected with no alarms noted. Patient left in satisfactory condition with VSS and no s/s of distress noted.

## 2021-10-05 NOTE — Progress Notes (Signed)
Louis House, Louis House   CLINIC:  Medical Oncology/Hematology  PCP:  Neale Burly, MD [place] / [place] [id number] [phone]   REASON FOR VISIT:  Follow-up for rectal cancer  PRIOR THERAPY: Capecitabine + XRT  NGS Results: not done  CURRENT THERAPY: FOLFOX q14d x 4 months (started 08/10/21)  BRIEF ONCOLOGIC HISTORY:  Oncology History  Rectal cancer ([place])  05/01/2021 Initial Diagnosis   Rectal adenocarcinoma ([place])   05/30/2021 - 05/30/2021 Chemotherapy   Patient is on Treatment Plan : COLORECTAL Capecitabine + XRT     08/10/2021 -  Chemotherapy   Patient is on Treatment Plan : COLORECTAL FOLFOX q14d x 4 months       CANCER STAGING:  Cancer Staging  Rectal cancer ([place]) Staging form: Colon and Rectum, AJCC 8th Edition - Clinical stage from 05/18/2021: Stage IIIB (cT2, cN2a, cM0) - Unsigned   INTERVAL HISTORY:  [name], a 66 y.o. male, returns for routine follow-up and consideration for next cycle of chemotherapy. Louis House was last seen on 09/07/2021.  Due for cycle #4 of FOLFOX today.   Overall, he tells me he has been feeling pretty well. He reports fatigue. He reports tingling/numbness in his his hands which was worse in the left hand and is now present without contact with cold objects for about 1 week following his last treatment; this has since resolved. He reports frequent dizziness. He reports a history of vertigo. He reports watery diarrhea over the past week. He is taking potassium daily. He denies abdominal pain and ankle swellings.   Overall, he feels ready for next cycle of chemo today.   REVIEW OF SYSTEMS:  Review of Systems  Constitutional:  Positive for fatigue. Negative for appetite change.  Respiratory:  Positive for cough.   Cardiovascular:  Negative for leg swelling.  Gastrointestinal:  Positive for diarrhea. Negative for abdominal pain.  Neurological:  Positive for dizziness.   All other systems reviewed and are negative.  PAST MEDICAL/SURGICAL HISTORY:  Past Medical History:  Diagnosis Date   Anxiety    Diabetes ([place]) 01/12/2017   Diarrhea 02/21/2017   Essential hypertension, benign 01/12/2017   Gout    High cholesterol 01/12/2017   Neuropathy    Port-A-Cath in place 07/28/2021   Sleep apnea    Vertigo    Past Surgical History:  Procedure Laterality Date   BIOPSY  04/06/2017   Procedure: BIOPSY;  Surgeon: Rogene Houston, MD;  Location: [facility];  Service: Endoscopy;;  colon   BIOPSY  04/12/2021   Procedure: BIOPSY;  Surgeon: Montez Morita, Quillian Quince, MD;  Location: [facility];  Service: Gastroenterology;;  nodular area in ascending colon biopsy random colon bx   bone spurs     CATARACT EXTRACTION W/PHACO Left 04/22/2018   Procedure: CATARACT EXTRACTION PHACO AND INTRAOCULAR LENS PLACEMENT LEFT EYE;  Surgeon: Tonny Branch, MD;  Location: [facility];  Service: Ophthalmology;  Laterality: Left;  CDE: 7.58   COLONOSCOPY WITH PROPOFOL N/A 04/06/2017   Procedure: COLONOSCOPY WITH PROPOFOL;  Surgeon: Rogene Houston, MD;  Location: [facility];  Service: Endoscopy;  Laterality: N/A;  1:45   COLONOSCOPY WITH PROPOFOL N/A 04/12/2021   Procedure: COLONOSCOPY WITH PROPOFOL;  Surgeon: Harvel Quale, MD;  [facility]: [facility];  Service: Gastroenterology;  Laterality: N/A;  9:05   FLEXIBLE SIGMOIDOSCOPY N/A 05/10/2021   Procedure: FLEXIBLE SIGMOIDOSCOPY;  Surgeon: Harvel Quale, MD;  Location:  [facility];  Service: Gastroenterology;  Laterality: N/A;  9:50 ASA 1 Per Dr C no Anesthesia, no PAT - ok per Cecilio Asper arthroscopy     both knees   Left shoulder surgery from injury     PORTACATH PLACEMENT Left 08/08/2021   Procedure: INSERTION PORT-A-CATH;  Surgeon: Aviva Signs, MD;  Location: [facility];  Service: General;  Laterality: Left;   Umblical hernia      SOCIAL HISTORY:  Social History   Socioeconomic History   Marital status:  Divorced    Spouse name: Not on file   Number of children: Not on file   Years of education: Not on file   Highest education level: Not on file  Occupational History   Not on file  Tobacco Use   Smoking status: Never   Smokeless tobacco: Never  Vaping Use   Vaping Use: Never used  Substance and Sexual Activity   Alcohol use: No   Drug use: No   Sexual activity: Yes    Birth control/protection: None  Other Topics Concern   Not on file  Social History Narrative   Not on file   Social Determinants of Health   Financial Resource Strain: Low Risk    Difficulty of Paying Living Expenses: Not very hard  Food Insecurity: No Food Insecurity   Worried About Running Out of Food in the Last Year: Never true   Ran Out of Food in the Last Year: Never true  Transportation Needs: No Transportation Needs   Lack of Transportation (Medical): No   Lack of Transportation (Non-Medical): No  Physical Activity: Insufficiently Active   Days of Exercise per Week: 2 days   Minutes of Exercise per Session: 20 min  Stress: No Stress Concern Present   Feeling of Stress : Not at all  Social Connections: Socially Isolated   Frequency of Communication with Friends and Family: More than three times a week   Frequency of Social Gatherings with Friends and Family: More than three times a week   Attends Religious Services: Never   Marine scientist or Organizations: No   Attends Music therapist: Never   Marital Status: Divorced  Human resources officer Violence: Not At Risk   Fear of Current or Ex-Partner: No   Emotionally Abused: No   Physically Abused: No   Sexually Abused: No    FAMILY HISTORY:  No family history on file.  CURRENT MEDICATIONS:  Current Outpatient Medications  Medication Sig Dispense Refill   alfuzosin (UROXATRAL) 10 MG 24 hr tablet Take 10 mg by mouth at bedtime. 2200     allopurinol (ZYLOPRIM) 300 MG tablet Take 300 mg by mouth daily.     atorvastatin (LIPITOR)  40 MG tablet Take 40 mg by mouth daily.     benazepril (LOTENSIN) 40 MG tablet Take 40 mg by mouth daily.  0   busPIRone (BUSPAR) 15 MG tablet Take 15 mg by mouth 2 (two) times daily.     chlorthalidone (HYGROTON) 50 MG tablet Take 50 mg by mouth daily.     colestipol (COLESTID) 1 g tablet Take 4 tablets (4 g total) by mouth daily. Take 4 hours apart form your other medications 360 tablet 3   Cyanocobalamin (VITAMIN B-12) 5000 MCG SUBL Place 15,000 mcg under the tongue daily.     diazepam (VALIUM) 10 MG tablet Take 10 mg by mouth at bedtime.      diclofenac (VOLTAREN) 75 MG EC tablet Take 75 mg  by mouth 2 (two) times daily.     diphenoxylate-atropine (LOMOTIL) 2.5-0.025 MG tablet Take 1 tablet by mouth 4 (four) times daily as needed for diarrhea or loose stools. 30 tablet 2   DULoxetine (CYMBALTA) 20 MG capsule Take 20 mg by mouth daily.     DULoxetine (CYMBALTA) 30 MG capsule Take 30 mg by mouth daily as needed.     fluorouracil CALGB 03009 2,400 mg/m2 in sodium chloride 0.9 % 150 mL Inject 2,400 mg/m2 into the vein over 48 hr.     FLUOROURACIL IV Inject 400 mg/m2 into the vein every 14 (fourteen) days.     fluticasone (FLONASE) 50 MCG/ACT nasal spray Place 3-4 sprays into both nostrils daily.     folic acid (FOLVITE) 1 MG tablet folic acid 1 mg tablet  TAKE 1 TABLET BY MOUTH DAILY     gabapentin (NEURONTIN) 400 MG capsule Take 800 mg by mouth 2 (two) times daily. 400-800 mg as needed for pain     HYDROcodone-acetaminophen (NORCO) 10-325 MG tablet Take 1 tablet by mouth 3 (three) times daily.     hydrOXYzine (ATARAX) 25 MG tablet      LEUCOVORIN CALCIUM IV Inject 400 mg/m2 into the vein every 14 (fourteen) days.     levofloxacin (LEVAQUIN) 500 MG tablet Take 1 tablet (500 mg total) by mouth daily. 7 tablet 0   lidocaine-prilocaine (EMLA) cream Apply a small amount to port a cath site (do not rub in) and cover with plastic wrap 1 hour prior to chemotherapy appointments 30 g 3   loperamide  (IMODIUM) 2 MG capsule Take 2 mg by mouth as needed. Use on schedule per Dr. Laural Golden.     loratadine (CLARITIN) 10 MG tablet Take 10 mg by mouth daily as needed for allergies (Seasonal).     meclizine (ANTIVERT) 25 MG tablet Take 25 mg by mouth 3 (three) times daily as needed for dizziness (for flare up of inner ear issues.).     Melatonin 10 MG CAPS Take 30 mg by mouth at bedtime.     metoprolol succinate (TOPROL-XL) 100 MG 24 hr tablet Take 100 mg by mouth daily.     naloxone (NARCAN) nasal spray 4 mg/0.1 mL Narcan 4 mg/actuation nasal spray  Take 1 spray as needed by nasal route as directed.     Naphazoline-Pheniramine (OPCON-A) 0.027-0.315 % SOLN Place 1 drop into both eyes daily as needed (irritation).     Omega-3 Fatty Acids (OMEGA-3 FISH OIL PO) Take 2,160 mg by mouth daily. 720 mg per cap     OXALIPLATIN IV Inject 85 mg/m2 into the vein every 14 (fourteen) days.     oxymetazoline (AFRIN) 0.05 % nasal spray Place 3 sprays into both nostrils 2 (two) times daily.     potassium chloride SA (KLOR-CON M) 20 MEQ tablet Take 1 tablet (20 mEq total) by mouth daily. 30 tablet 3   prochlorperazine (COMPAZINE) 10 MG tablet Take 1 tablet (10 mg total) by mouth in the morning and at bedtime. Take 17mn before chemotherapy 60 tablet 3   sulfaSALAzine (AZULFIDINE) 500 MG tablet sulfasalazine 500 mg tablet  TAKE 2 TABLETS BY MOUTH TWICE DAILY     tizanidine (ZANAFLEX) 2 MG capsule Take 2 mg by mouth 3 (three) times daily as needed for muscle spasms.     traZODone (DESYREL) 50 MG tablet Take 50 mg by mouth at bedtime.     Current Facility-Administered Medications  Medication Dose Route Frequency Provider Last Rate Last Admin  dicyclomine (BENTYL) tablet 20 mg  20 mg Oral TID AC & HS Setzer, Terri L, NP       Facility-Administered Medications Ordered in Other Visits  Medication Dose Route Frequency Provider Last Rate Last Admin   sodium chloride flush (NS) 0.9 % injection 10 mL  10 mL Intracatheter PRN  Derek Jack, MD   10 mL at 09/09/21 1348    ALLERGIES:  No Known Allergies  PHYSICAL EXAM:  Performance status (ECOG): 0 - Asymptomatic  There were no vitals filed for this visit. Wt Readings from Last 3 Encounters:  10/05/21 276 lb (125.2 kg)  09/07/21 279 lb 14.4 oz (127 kg)  08/24/21 284 lb 9.6 oz (129.1 kg)   Physical Exam Vitals reviewed.  Constitutional:      Appearance: Normal appearance. He is obese.  Cardiovascular:     Rate and Rhythm: Normal rate and regular rhythm.     Pulses: Normal pulses.     Heart sounds: Normal heart sounds.  Pulmonary:     Effort: Pulmonary effort is normal.     Breath sounds: Normal breath sounds.  Neurological:     General: No focal deficit present.     Mental Status: He is alert and oriented to person, place, and time.  Psychiatric:        Mood and Affect: Mood normal.        Behavior: Behavior normal.    LABORATORY DATA:  I have reviewed the labs as listed.  CBC Latest Ref Rng & Units 10/05/2021 09/07/2021 08/24/2021  WBC 4.0 - 10.5 K/uL 6.0 6.3 5.9  Hemoglobin 13.0 - 17.0 g/dL 10.4(L) 11.2(L) 10.0(L)  Hematocrit 39.0 - 52.0 % 30.8(L) 32.8(L) 30.0(L)  Platelets 150 - 400 K/uL 233 173 192   CMP Latest Ref Rng & Units 10/05/2021 09/07/2021 08/24/2021  Glucose 70 - 99 mg/dL 186(H) 193(H) 156(H)  BUN 8 - 23 mg/dL 32(H) 24(H) 34(H)  Creatinine 0.61 - 1.24 mg/dL 1.63(H) 1.21 1.27(H)  Sodium 135 - 145 mmol/L 133(L) 134(L) 135  Potassium 3.5 - 5.1 mmol/L 3.1(L) 4.0 3.8  Chloride 98 - 111 mmol/L 93(L) 98 99  CO2 22 - 32 mmol/L 30 29 28   Calcium 8.9 - 10.3 mg/dL 9.9 9.1 9.0  Total Protein 6.5 - 8.1 g/dL 6.4(L) 6.6 6.5  Total Bilirubin 0.3 - 1.2 mg/dL 0.4 0.4 0.4  Alkaline Phos 38 - 126 U/L 57 65 57  AST 15 - 41 U/L 19 34 25  ALT 0 - 44 U/L 17 50(H) 26    DIAGNOSTIC IMAGING:  I have independently reviewed the scans and discussed with the patient. DG Chest 2 View  Result Date: 09/27/2021 CLINICAL DATA:  Productive cough,  congestion and sob x 2 weeks. persistent cough, wheezing EXAM: CHEST - 2 VIEW COMPARISON:  08/08/2021 FINDINGS: Port in the anterior chest wall with tip in distal SVC. Central linear streaky opacities greater on LEFT. No focal consolidation. No pneumothorax. No acute osseous abnormality. IMPRESSION: Streaky central linear opacities suggest bronchitis or atelectasis Electronically Signed   By: Suzy Bouchard M.D.   On: 09/27/2021 15:46     ASSESSMENT:  Stage III (T2N2) rectal cancer, MSI stable: - He reported diarrhea which started in May.  Prior colonoscopy was in 2018. - Colonoscopy on 04/12/2021 showed fungating polypoid nonobstructing mass with central depression found at 8 cm from the anal verge.  Mass was noncircumferential, measured 2 cm in length.  Nodular mucosa in the proximal ascending colon which was biopsied. - Pathology of  the rectal mass consistent with adenocarcinoma.  Nodular area in the ascending colon was consistent with collagenous colitis.  MMR preserved.  MSI stable. - CT CAP on 05/13/2021 with no evidence of metastatic disease in the chest, abdomen or pelvis.  Mild hepatic steatosis. - Pelvic MRI on 05/12/2021-T2N2 by MRI staging.  4 small lymph nodes 5 mm or greater. - Total neoadjuvant therapy was recommended on discussion with Dr. Marcello Moores. - Xeloda and XRT from 06/08/2021 through 07/15/2021. - FOLFOX started on 08/10/2021.  2.  Social/family history: - He lives at home by himself.  Daughter lives in [place]. - He worked at [employer] in [place] prior to retirement.  Non-smoker. - Sister had lung cancer and was a smoker.  No family history of colon cancer.   PLAN:  Stage III (T2N2) rectal adenocarcinoma, MSI stable: - Cycle 3 of chemotherapy was on 09/07/2022. - After that he developed upper respiratory infection and bronchitis and received 2 different antibiotics. - He felt very weak after last chemotherapy. - He had constant left hand numbness which lasted about a  week but nothing in the feet. - Reviewed his labs today which showed normal LFTs.  Creatinine is elevated at 1.63.  He will receive 1 L normal saline today.  I will discontinue chlorthalidone as he complains of some dizziness.  Blood pressure is systolic 111.  CBC shows normal white count and platelet count. - I will dose reduce by 20% today.  RTC 2 weeks for follow-up.   2.  Normocytic anemia: - Combination anemia from CKD and relative iron deficiency and chemotherapy. - Hemoglobin today is 10.4.  Last ferritin was 58 and percent saturation 17.  We will consider parenteral iron therapy if there is any worsening.  3.  Diarrhea: - Continue colestipol 4 g daily. - I have sent a prescription for Lomotil to be taken as needed.  4.  Chronic back pain: - Continue hydrocodone 10/325 daily as needed.  5.  Hypokalemia: - Continue potassium 20 mEq daily.  Potassium today 3.1.  He will receive extra potassium today. - We will also give him 4 g of magnesium today as his magnesium was low at 1.3.   Orders placed this encounter:  No orders of the defined types were placed in this encounter.    Derek Jack, MD [place] [phone]   I, Thana Ates, am acting as a scribe for Dr. Derek Jack.  I, Derek Jack MD, have reviewed the above documentation for accuracy and completeness, and I agree with the above.

## 2021-10-05 NOTE — Patient Instructions (Signed)
[place]  Discharge Instructions: Thank you for choosing [facility] to provide your oncology and hematology care.  If you have a lab appointment with the [facility], please come in thru the Main Entrance and check in at the main information desk.  Wear comfortable clothing and clothing appropriate for easy access to any Portacath or PICC line.   We strive to give you quality time with your provider. You may need to reschedule your appointment if you arrive late (15 or more minutes).  Arriving late affects you and other patients whose appointments are after yours.  Also, if you miss three or more appointments without notifying the office, you may be dismissed from the clinic at the providers discretion.      For prescription refill requests, have your pharmacy contact our office and allow 72 hours for refills to be completed.    Today you received the following chemotherapy and/or immunotherapy agents FOLFOX. 4g of magnesium 1L of normal saline and 40 mEq of PO potassium. Return as scheduled.  To help prevent nausea and vomiting after your treatment, we encourage you to take your nausea medication as directed.  BELOW ARE SYMPTOMS THAT SHOULD BE REPORTED IMMEDIATELY: *FEVER GREATER THAN 100.4 F (38 C) OR HIGHER *CHILLS OR SWEATING *NAUSEA AND VOMITING THAT IS NOT CONTROLLED WITH YOUR NAUSEA MEDICATION *UNUSUAL SHORTNESS OF BREATH *UNUSUAL BRUISING OR BLEEDING *URINARY PROBLEMS (pain or burning when urinating, or frequent urination) *BOWEL PROBLEMS (unusual diarrhea, constipation, pain near the anus) TENDERNESS IN MOUTH AND THROAT WITH OR WITHOUT PRESENCE OF ULCERS (sore throat, sores in mouth, or a toothache) UNUSUAL RASH, SWELLING OR PAIN  UNUSUAL VAGINAL DISCHARGE OR ITCHING   Items with * indicate a potential emergency and should be followed up as soon as possible or go to the Emergency Department if any problems should occur.  Please show the  CHEMOTHERAPY ALERT CARD or IMMUNOTHERAPY ALERT CARD at check-in to the Emergency Department and triage nurse.  Should you have questions after your visit or need to cancel or reschedule your appointment, please contact [facility] [phone]  and follow the prompts.  Office hours are 8:00 a.m. to 4:30 p.m. Monday - Friday. Please note that voicemails left after 4:00 p.m. may not be returned until the following business day.  We are closed weekends and major holidays. You have access to a nurse at all times for urgent questions. Please call the main number to the clinic [phone] and follow the prompts.  For any non-urgent questions, you may also contact your provider using MyChart. We now offer e-Visits for anyone 63 and older to request care online for non-urgent symptoms. For details visit [email or web address].   Also download the MyChart app! Go to the app store, search "MyChart", open the app, select [place], and log in with your MyChart username and password.  Due to Covid, a mask is required upon entering the hospital/clinic. If you do not have a mask, one will be given to you upon arrival. For doctor visits, patients may have 1 support person aged 65 or older with them. For treatment visits, patients cannot have anyone with them due to current Covid guidelines and our immunocompromised population.

## 2021-10-05 NOTE — Progress Notes (Signed)
Patient seen and examined, labs reviewed by Dr. Delton Coombes. Okay to proceed with treatment at 80% dose today. Patient to also receive 4g Mag IV, 1L NS, and 30mq potassium po today in the clinic. Primary RN and Pharmacy made aware.

## 2021-10-07 ENCOUNTER — Inpatient Hospital Stay (HOSPITAL_COMMUNITY): Payer: Medicare Other

## 2021-10-07 ENCOUNTER — Encounter (HOSPITAL_COMMUNITY): Payer: Medicare Other

## 2021-10-07 VITALS — BP 103/73 | HR 90 | Temp 97.0°F | Resp 19

## 2021-10-07 DIAGNOSIS — Z95828 Presence of other vascular implants and grafts: Secondary | ICD-10-CM

## 2021-10-07 DIAGNOSIS — E876 Hypokalemia: Secondary | ICD-10-CM | POA: Diagnosis not present

## 2021-10-07 DIAGNOSIS — C20 Malignant neoplasm of rectum: Secondary | ICD-10-CM

## 2021-10-07 DIAGNOSIS — Z5111 Encounter for antineoplastic chemotherapy: Secondary | ICD-10-CM | POA: Diagnosis not present

## 2021-10-07 MED ORDER — SODIUM CHLORIDE 0.9% FLUSH
10.0000 mL | INTRAVENOUS | Status: DC | PRN
  Administered 2021-10-07: 10 mL

## 2021-10-07 MED ORDER — HEPARIN SOD (PORK) LOCK FLUSH 100 UNIT/ML IV SOLN
500.0000 [IU] | Freq: Once | INTRAVENOUS | Status: AC | PRN
  Administered 2021-10-07: 500 [IU]

## 2021-10-07 NOTE — Progress Notes (Signed)
Patients port flushed without difficulty.  Good blood return noted with no bruising or swelling noted at site.  Home infusion 5FU pump disconnected.  Band aid applied.  VSS with discharge and left in satisfactory condition with no s/s of distress noted.

## 2021-10-07 NOTE — Patient Instructions (Signed)
[facility]  Discharge Instructions: Thank you for choosing [facility] to provide your oncology and hematology care.  If you have a lab appointment with the Cancer Center, please come in thru the Main Entrance and check in at the main information desk.  Wear comfortable clothing and clothing appropriate for easy access to any Portacath or PICC line.   We strive to give you quality time with your provider. You may need to reschedule your appointment if you arrive late (15 or more minutes).  Arriving late affects you and other patients whose appointments are after yours.  Also, if you miss three or more appointments without notifying the office, you may be dismissed from the clinic at the provider's discretion.      For prescription refill requests, have your pharmacy contact our office and allow 72 hours for refills to be completed.        To help prevent nausea and vomiting after your treatment, we encourage you to take your nausea medication as directed.  BELOW ARE SYMPTOMS THAT SHOULD BE REPORTED IMMEDIATELY: *FEVER GREATER THAN 100.4 F (38 C) OR HIGHER *CHILLS OR SWEATING *NAUSEA AND VOMITING THAT IS NOT CONTROLLED WITH YOUR NAUSEA MEDICATION *UNUSUAL SHORTNESS OF BREATH *UNUSUAL BRUISING OR BLEEDING *URINARY PROBLEMS (pain or burning when urinating, or frequent urination) *BOWEL PROBLEMS (unusual diarrhea, constipation, pain near the anus) TENDERNESS IN MOUTH AND THROAT WITH OR WITHOUT PRESENCE OF ULCERS (sore throat, sores in mouth, or a toothache) UNUSUAL RASH, SWELLING OR PAIN  UNUSUAL VAGINAL DISCHARGE OR ITCHING   Items with * indicate a potential emergency and should be followed up as soon as possible or go to the Emergency Department if any problems should occur.  Please show the CHEMOTHERAPY ALERT CARD or IMMUNOTHERAPY ALERT CARD at check-in to the Emergency Department and triage nurse.  Should you have questions after your visit or need to cancel  or reschedule your appointment, please contact [facility] [phone]  and follow the prompts.  Office hours are 8:00 a.m. to 4:30 p.m. Monday - Friday. Please note that voicemails left after 4:00 p.m. may not be returned until the following business day.  We are closed weekends and major holidays. You have access to a nurse at all times for urgent questions. Please call the main number to the clinic [phone] and follow the prompts.  For any non-urgent questions, you may also contact your provider using MyChart. We now offer e-Visits for anyone 18 and older to request care online for non-urgent symptoms. For details visit [email or web address].   Also download the MyChart app! Go to the app store, search "MyChart", open the app, select [facility], and log in with your MyChart username and password.  Due to Covid, a mask is required upon entering the hospital/clinic. If you do not have a mask, one will be given to you upon arrival. For doctor visits, patients may have 1 support person aged 18 or older with them. For treatment visits, patients cannot have anyone with them due to current Covid guidelines and our immunocompromised population.  

## 2021-10-10 ENCOUNTER — Telehealth (HOSPITAL_COMMUNITY): Payer: Self-pay | Admitting: *Deleted

## 2021-10-10 NOTE — Telephone Encounter (Signed)
Patient called to advise that he had 4-6 watery stools each day over the weekend.  It wasn't truly clear as to exactly how he was taking his lomotil and Imodium.  States he has only had one loose stool today.  Reviewed instructions on how to take medication.  Suggested that he keep ahead of things and take in the morning for the next couple of days then see how his stools are after that.  He will call back if diarrhea continues.  Verbalized understanding.

## 2021-10-12 ENCOUNTER — Ambulatory Visit (HOSPITAL_COMMUNITY): Payer: Medicare Other | Admitting: Hematology

## 2021-10-12 ENCOUNTER — Ambulatory Visit (HOSPITAL_COMMUNITY): Payer: Medicare Other

## 2021-10-12 ENCOUNTER — Other Ambulatory Visit (HOSPITAL_COMMUNITY): Payer: Medicare Other

## 2021-10-14 ENCOUNTER — Encounter (HOSPITAL_COMMUNITY): Payer: Medicare Other

## 2021-10-17 ENCOUNTER — Ambulatory Visit (INDEPENDENT_AMBULATORY_CARE_PROVIDER_SITE_OTHER): Payer: Medicare Other | Admitting: Gastroenterology

## 2021-10-18 ENCOUNTER — Ambulatory Visit (HOSPITAL_COMMUNITY): Payer: Medicare Other

## 2021-10-18 ENCOUNTER — Other Ambulatory Visit (HOSPITAL_COMMUNITY): Payer: Medicare Other

## 2021-10-18 ENCOUNTER — Ambulatory Visit (HOSPITAL_COMMUNITY): Payer: Medicare Other | Admitting: Hematology

## 2021-10-19 ENCOUNTER — Inpatient Hospital Stay (HOSPITAL_COMMUNITY): Payer: Medicare Other | Attending: Hematology

## 2021-10-19 ENCOUNTER — Ambulatory Visit (HOSPITAL_COMMUNITY): Payer: Medicare Other | Admitting: Hematology

## 2021-10-19 ENCOUNTER — Inpatient Hospital Stay (HOSPITAL_COMMUNITY): Payer: Medicare Other

## 2021-10-19 ENCOUNTER — Other Ambulatory Visit: Payer: Self-pay

## 2021-10-19 ENCOUNTER — Other Ambulatory Visit (HOSPITAL_COMMUNITY): Payer: Medicare Other

## 2021-10-19 ENCOUNTER — Ambulatory Visit (HOSPITAL_COMMUNITY): Payer: Medicare Other

## 2021-10-19 ENCOUNTER — Inpatient Hospital Stay (HOSPITAL_BASED_OUTPATIENT_CLINIC_OR_DEPARTMENT_OTHER): Payer: Medicare Other | Admitting: Hematology

## 2021-10-19 VITALS — BP 148/88 | HR 72 | Temp 97.0°F | Resp 20

## 2021-10-19 DIAGNOSIS — D649 Anemia, unspecified: Secondary | ICD-10-CM | POA: Diagnosis not present

## 2021-10-19 DIAGNOSIS — C20 Malignant neoplasm of rectum: Secondary | ICD-10-CM | POA: Insufficient documentation

## 2021-10-19 DIAGNOSIS — E876 Hypokalemia: Secondary | ICD-10-CM | POA: Insufficient documentation

## 2021-10-19 DIAGNOSIS — Z5111 Encounter for antineoplastic chemotherapy: Secondary | ICD-10-CM | POA: Insufficient documentation

## 2021-10-19 DIAGNOSIS — Z95828 Presence of other vascular implants and grafts: Secondary | ICD-10-CM

## 2021-10-19 DIAGNOSIS — E7800 Pure hypercholesterolemia, unspecified: Secondary | ICD-10-CM | POA: Insufficient documentation

## 2021-10-19 DIAGNOSIS — G629 Polyneuropathy, unspecified: Secondary | ICD-10-CM | POA: Insufficient documentation

## 2021-10-19 DIAGNOSIS — R197 Diarrhea, unspecified: Secondary | ICD-10-CM | POA: Insufficient documentation

## 2021-10-19 DIAGNOSIS — M549 Dorsalgia, unspecified: Secondary | ICD-10-CM | POA: Diagnosis not present

## 2021-10-19 DIAGNOSIS — Z79899 Other long term (current) drug therapy: Secondary | ICD-10-CM | POA: Insufficient documentation

## 2021-10-19 DIAGNOSIS — G4730 Sleep apnea, unspecified: Secondary | ICD-10-CM | POA: Diagnosis not present

## 2021-10-19 LAB — CBC WITH DIFFERENTIAL/PLATELET
Abs Immature Granulocytes: 0.03 10*3/uL (ref 0.00–0.07)
Basophils Absolute: 0 10*3/uL (ref 0.0–0.1)
Basophils Relative: 1 %
Eosinophils Absolute: 0.2 10*3/uL (ref 0.0–0.5)
Eosinophils Relative: 4 %
HCT: 29.6 % — ABNORMAL LOW (ref 39.0–52.0)
Hemoglobin: 10 g/dL — ABNORMAL LOW (ref 13.0–17.0)
Immature Granulocytes: 1 %
Lymphocytes Relative: 11 %
Lymphs Abs: 0.5 10*3/uL — ABNORMAL LOW (ref 0.7–4.0)
MCH: 33.6 pg (ref 26.0–34.0)
MCHC: 33.8 g/dL (ref 30.0–36.0)
MCV: 99.3 fL (ref 80.0–100.0)
Monocytes Absolute: 0.5 10*3/uL (ref 0.1–1.0)
Monocytes Relative: 12 %
Neutro Abs: 2.9 10*3/uL (ref 1.7–7.7)
Neutrophils Relative %: 71 %
Platelets: 146 10*3/uL — ABNORMAL LOW (ref 150–400)
RBC: 2.98 MIL/uL — ABNORMAL LOW (ref 4.22–5.81)
RDW: 16 % — ABNORMAL HIGH (ref 11.5–15.5)
WBC: 4.1 10*3/uL (ref 4.0–10.5)
nRBC: 0 % (ref 0.0–0.2)

## 2021-10-19 LAB — COMPREHENSIVE METABOLIC PANEL
ALT: 20 U/L (ref 0–44)
AST: 21 U/L (ref 15–41)
Albumin: 3.5 g/dL (ref 3.5–5.0)
Alkaline Phosphatase: 63 U/L (ref 38–126)
Anion gap: 7 (ref 5–15)
BUN: 25 mg/dL — ABNORMAL HIGH (ref 8–23)
CO2: 30 mmol/L (ref 22–32)
Calcium: 9.2 mg/dL (ref 8.9–10.3)
Chloride: 99 mmol/L (ref 98–111)
Creatinine, Ser: 1.32 mg/dL — ABNORMAL HIGH (ref 0.61–1.24)
GFR, Estimated: 60 mL/min — ABNORMAL LOW (ref >60)
Glucose, Bld: 125 mg/dL — ABNORMAL HIGH (ref 70–99)
Potassium: 3.9 mmol/L (ref 3.5–5.1)
Sodium: 136 mmol/L (ref 135–145)
Total Bilirubin: 0.9 mg/dL (ref 0.3–1.2)
Total Protein: 6 g/dL — ABNORMAL LOW (ref 6.5–8.1)

## 2021-10-19 LAB — MAGNESIUM: Magnesium: 1.9 mg/dL (ref 1.7–2.4)

## 2021-10-19 MED ORDER — DEXTROSE 5 % IV SOLN: Freq: Once | INTRAVENOUS | Status: AC

## 2021-10-19 MED ORDER — DEXTROSE 5 % IV SOLN: Freq: Once | INTRAVENOUS | Status: DC

## 2021-10-19 MED ORDER — FLUOROURACIL CHEMO INJECTION 2.5 GM/50ML
320.0000 mg/m2 | Freq: Once | INTRAVENOUS | Status: AC
  Administered 2021-10-19: 800 mg via INTRAVENOUS
  Filled 2021-10-19: qty 16

## 2021-10-19 MED ORDER — SODIUM CHLORIDE 0.9% FLUSH: 10.0000 mL | INTRAVENOUS | Status: DC | PRN

## 2021-10-19 MED ORDER — SODIUM CHLORIDE 0.9 % IV SOLN
1940.0000 mg/m2 | INTRAVENOUS | Status: DC
  Administered 2021-10-19: 5000 mg via INTRAVENOUS
  Filled 2021-10-19: qty 100

## 2021-10-19 MED ORDER — HEPARIN SOD (PORK) LOCK FLUSH 100 UNIT/ML IV SOLN: 500.0000 [IU] | Freq: Once | INTRAVENOUS | Status: DC | PRN

## 2021-10-19 MED ORDER — SODIUM CHLORIDE 0.9 % IV SOLN: Freq: Once | INTRAVENOUS | Status: AC

## 2021-10-19 MED ORDER — LEUCOVORIN CALCIUM INJECTION 350 MG
320.0000 mg/m2 | Freq: Once | INTRAVENOUS | Status: AC
  Administered 2021-10-19: 822 mg via INTRAVENOUS
  Filled 2021-10-19: qty 41.1

## 2021-10-19 MED ORDER — OXALIPLATIN CHEMO INJECTION 100 MG/20ML
68.0000 mg/m2 | Freq: Once | INTRAVENOUS | Status: AC
  Administered 2021-10-19: 175 mg via INTRAVENOUS
  Filled 2021-10-19: qty 35

## 2021-10-19 MED ORDER — PALONOSETRON HCL INJECTION 0.25 MG/5ML
0.2500 mg | Freq: Once | INTRAVENOUS | Status: AC
  Administered 2021-10-19: 0.25 mg via INTRAVENOUS
  Filled 2021-10-19: qty 5

## 2021-10-19 MED ORDER — SODIUM CHLORIDE 0.9 % IV SOLN
10.0000 mg | Freq: Once | INTRAVENOUS | Status: AC
  Administered 2021-10-19: 10 mg via INTRAVENOUS
  Filled 2021-10-19: qty 10

## 2021-10-19 NOTE — Patient Instructions (Signed)
[facility]  Discharge Instructions: Thank you for choosing [facility] to provide your oncology and hematology care.  If you have a lab appointment with the [facility], please come in thru the Main Entrance and check in at the main information desk.  Wear comfortable clothing and clothing appropriate for easy access to any Portacath or PICC line.   We strive to give you quality time with your provider. You may need to reschedule your appointment if you arrive late (15 or more minutes).  Arriving late affects you and other patients whose appointments are after yours.  Also, if you miss three or more appointments without notifying the office, you may be dismissed from the clinic at the providers discretion.      For prescription refill requests, have your pharmacy contact our office and allow 72 hours for refills to be completed.    Today you received the following chemotherapy and/or immunotherapy agents Folfox and pump start      To help prevent nausea and vomiting after your treatment, we encourage you to take your nausea medication as directed.  BELOW ARE SYMPTOMS THAT SHOULD BE REPORTED IMMEDIATELY: *FEVER GREATER THAN 100.4 F (38 C) OR HIGHER *CHILLS OR SWEATING *NAUSEA AND VOMITING THAT IS NOT CONTROLLED WITH YOUR NAUSEA MEDICATION *UNUSUAL SHORTNESS OF BREATH *UNUSUAL BRUISING OR BLEEDING *URINARY PROBLEMS (pain or burning when urinating, or frequent urination) *BOWEL PROBLEMS (unusual diarrhea, constipation, pain near the anus) TENDERNESS IN MOUTH AND THROAT WITH OR WITHOUT PRESENCE OF ULCERS (sore throat, sores in mouth, or a toothache) UNUSUAL RASH, SWELLING OR PAIN  UNUSUAL VAGINAL DISCHARGE OR ITCHING   Items with * indicate a potential emergency and should be followed up as soon as possible or go to the Emergency Department if any problems should occur.  Please show the CHEMOTHERAPY ALERT CARD or IMMUNOTHERAPY ALERT CARD at check-in to the  Emergency Department and triage nurse.  Should you have questions after your visit or need to cancel or reschedule your appointment, please contact [facility] [phone]  and follow the prompts.  Office hours are 8:00 a.m. to 4:30 p.m. Monday - Friday. Please note that voicemails left after 4:00 p.m. may not be returned until the following business day.  We are closed weekends and major holidays. You have access to a nurse at all times for urgent questions. Please call the main number to the clinic [phone] and follow the prompts.  For any non-urgent questions, you may also contact your provider using MyChart. We now offer e-Visits for anyone 65 and older to request care online for non-urgent symptoms. For details visit [email or web address].   Also download the MyChart app! Go to the app store, search "MyChart", open the app, select [facility], and log in with your MyChart username and password.  Due to Covid, a mask is required upon entering the hospital/clinic. If you do not have a mask, one will be given to you upon arrival. For doctor visits, patients may have 1 support person aged 44 or older with them. For treatment visits, patients cannot have anyone with them due to current Covid guidelines and our immunocompromised population.

## 2021-10-19 NOTE — Progress Notes (Signed)
Louis House, [place] [id number]   CLINIC:  Medical Oncology/Hematology  PCP:  Neale Burly, MD [place] / [place] [address] [id number] [phone]   REASON FOR VISIT:  Follow-up for rectal cancer  PRIOR THERAPY: Capecitabine + XRT  NGS Results: not done  CURRENT THERAPY: FOLFOX q14d x 4 months (started 08/10/21)  BRIEF ONCOLOGIC HISTORY:  Oncology History  Rectal cancer ([place])  05/01/2021 Initial Diagnosis   Rectal adenocarcinoma ([place])   05/30/2021 - 05/30/2021 Chemotherapy   Patient is on Treatment Plan : COLORECTAL Capecitabine + XRT     08/10/2021 -  Chemotherapy   Patient is on Treatment Plan : COLORECTAL FOLFOX q14d x 4 months       CANCER STAGING:  Cancer Staging  Rectal cancer ([place]) Staging form: Colon and Rectum, AJCC 8th Edition - Clinical stage from 05/18/2021: Stage IIIB (cT2, cN2a, cM0) - Unsigned   INTERVAL HISTORY:  Louis House, a 66 y.o. male, returns for routine follow-up and consideration for next cycle of chemotherapy. [name] was last seen on 10/05/2021.  Due for cycle #5 of FOLFOX today.   Overall, he tells me he has been feeling pretty well. He reports that last week he had 3-4 watery BM daily for 4 days for which he took lomotil and Imodium. His energy has improved. He gained 2 lbs since 09/07/2021. He denies nausea and vomiting. He had cold sensitivity lasting for a week following his last treatment which has since resolved. He denies ankle swellings.   Overall, he feels ready for next cycle of chemo today.   REVIEW OF SYSTEMS:  Review of Systems  Constitutional:  Negative for appetite change, fatigue and unexpected weight change.  Cardiovascular:  Negative for leg swelling.  Gastrointestinal:  Positive for diarrhea. Negative for nausea and vomiting.  Neurological:  Positive for headaches and numbness.  All other systems reviewed and are negative.  PAST MEDICAL/SURGICAL HISTORY:  Past Medical  History:  Diagnosis Date   Anxiety    Diabetes ([place]) 01/12/2017   Diarrhea 02/21/2017   Essential hypertension, benign 01/12/2017   Gout    High cholesterol 01/12/2017   Neuropathy    Port-A-Cath in place 07/28/2021   Sleep apnea    Vertigo    Past Surgical History:  Procedure Laterality Date   BIOPSY  04/06/2017   Procedure: BIOPSY;  Surgeon: Rogene Houston, MD;  [facility];  Service: Endoscopy;;  colon   BIOPSY  04/12/2021   Procedure: BIOPSY;  Surgeon: [facility] Morita, Quillian Quince, MD;  Location: [facility];  Service: Gastroenterology;;  nodular area in ascending colon biopsy random colon bx   bone spurs     CATARACT EXTRACTION W/PHACO Left 04/22/2018   Procedure: CATARACT EXTRACTION PHACO AND INTRAOCULAR LENS PLACEMENT LEFT EYE;  Surgeon: Tonny Branch, MD;  Location: [facility];  Service: Ophthalmology;  Laterality: Left;  CDE: 7.58   COLONOSCOPY WITH PROPOFOL N/A 04/06/2017   Procedure: COLONOSCOPY WITH PROPOFOL;  Surgeon: Rogene Houston, MD;  Location: [facility];  Service: Endoscopy;  Laterality: N/A;  1:45   COLONOSCOPY WITH PROPOFOL N/A 04/12/2021   Procedure: COLONOSCOPY WITH PROPOFOL;  Surgeon: Harvel Quale, MD;  [facility];  Service: Gastroenterology;  Laterality: N/A;  9:05   FLEXIBLE SIGMOIDOSCOPY N/A 05/10/2021   Procedure: FLEXIBLE SIGMOIDOSCOPY;  Surgeon: Harvel Quale, MD;  Location: [facility];  Service: Gastroenterology;  Laterality: N/A;  9:50 ASA 1 Per  Dr C no Anesthesia, no PAT - ok per Cecilio Asper arthroscopy     both knees   Left shoulder surgery from injury     PORTACATH PLACEMENT Left 08/08/2021   Procedure: INSERTION PORT-A-CATH;  Surgeon: Aviva Signs, MD;  Location: [facility];  Service: General;  Laterality: Left;   Umblical hernia      SOCIAL HISTORY:  Social History   Socioeconomic History   Marital status: Divorced    Spouse name: Not on file   Number of children: Not on file   Years of education:  Not on file   Highest education level: Not on file  Occupational History   Not on file  Tobacco Use   Smoking status: Never   Smokeless tobacco: Never  Vaping Use   Vaping Use: Never used  Substance and Sexual Activity   Alcohol use: No   Drug use: No   Sexual activity: Yes    Birth control/protection: None  Other Topics Concern   Not on file  Social History Narrative   Not on file   Social Determinants of Health   Financial Resource Strain: Low Risk    Difficulty of Paying Living Expenses: Not very hard  Food Insecurity: No Food Insecurity   Worried About Running Out of Food in the Last Year: Never true   Ran Out of Food in the Last Year: Never true  Transportation Needs: No Transportation Needs   Lack of Transportation (Medical): No   Lack of Transportation (Non-Medical): No  Physical Activity: Insufficiently Active   Days of Exercise per Week: 2 days   Minutes of Exercise per Session: 20 min  Stress: No Stress Concern Present   Feeling of Stress : Not at all  Social Connections: Socially Isolated   Frequency of Communication with Friends and Family: More than three times a week   Frequency of Social Gatherings with Friends and Family: More than three times a week   Attends Religious Services: Never   Marine scientist or Organizations: No   Attends Music therapist: Never   Marital Status: Divorced  Human resources officer Violence: Not At Risk   Fear of Current or Ex-Partner: No   Emotionally Abused: No   Physically Abused: No   Sexually Abused: No    FAMILY HISTORY:  No family history on file.  CURRENT MEDICATIONS:  Current Outpatient Medications  Medication Sig Dispense Refill   alfuzosin (UROXATRAL) 10 MG 24 hr tablet Take 10 mg by mouth at bedtime. 2200     allopurinol (ZYLOPRIM) 300 MG tablet Take 300 mg by mouth daily.     atorvastatin (LIPITOR) 40 MG tablet Take 40 mg by mouth daily.     benazepril (LOTENSIN) 40 MG tablet Take 40 mg by  mouth daily.  0   busPIRone (BUSPAR) 15 MG tablet Take 15 mg by mouth 2 (two) times daily.     chlorthalidone (HYGROTON) 50 MG tablet Take 50 mg by mouth daily.     colestipol (COLESTID) 1 g tablet Take 4 tablets (4 g total) by mouth daily. Take 4 hours apart form your other medications 360 tablet 3   Cyanocobalamin (VITAMIN B-12) 5000 MCG SUBL Place 15,000 mcg under the tongue daily.     diazepam (VALIUM) 10 MG tablet Take 10 mg by mouth at bedtime.      diclofenac (VOLTAREN) 75 MG EC tablet Take 75 mg by mouth 2 (two) times daily.     diphenoxylate-atropine (LOMOTIL) 2.5-0.025 MG  tablet Take 1 tablet by mouth 4 (four) times daily as needed for diarrhea or loose stools. 60 tablet 2   DULoxetine (CYMBALTA) 20 MG capsule Take 20 mg by mouth daily.     DULoxetine (CYMBALTA) 30 MG capsule Take 30 mg by mouth daily as needed.     fluorouracil [place]GB [address] 2,400 mg/m2 in sodium chloride 0.9 % 150 mL Inject 2,400 mg/m2 into the vein over 48 hr.     FLUOROURACIL IV Inject 400 mg/m2 into the vein every 14 (fourteen) days.     fluticasone (FLONASE) 50 MCG/ACT nasal spray Place 3-4 sprays into both nostrils daily.     folic acid (FOLVITE) 1 MG tablet folic acid 1 mg tablet  TAKE 1 TABLET BY MOUTH DAILY     gabapentin (NEURONTIN) 400 MG capsule Take 800 mg by mouth 2 (two) times daily. 400-800 mg as needed for pain     HYDROcodone-acetaminophen (NORCO) 10-325 MG tablet Take 1 tablet by mouth 3 (three) times daily.     hydrOXYzine (ATARAX) 25 MG tablet      LEUCOVORIN CALCIUM IV Inject 400 mg/m2 into the vein every 14 (fourteen) days.     levofloxacin (LEVAQUIN) 500 MG tablet Take 1 tablet (500 mg total) by mouth daily. 7 tablet 0   lidocaine-prilocaine (EMLA) cream Apply a small amount to port a cath site (do not rub in) and cover with plastic wrap 1 hour prior to chemotherapy appointments 30 g 3   loperamide (IMODIUM) 2 MG capsule Take 2 mg by mouth as needed. Use on schedule per Dr. Laural Golden.      loratadine (CLARITIN) 10 MG tablet Take 10 mg by mouth daily as needed for allergies (Seasonal).     meclizine (ANTIVERT) 25 MG tablet Take 25 mg by mouth 3 (three) times daily as needed for dizziness (for flare up of inner ear issues.).     Melatonin 10 MG CAPS Take 30 mg by mouth at bedtime.     metoprolol succinate (TOPROL-XL) 100 MG 24 hr tablet Take 100 mg by mouth daily.     naloxone (NARCAN) nasal spray 4 mg/0.1 mL Narcan 4 mg/actuation nasal spray  Take 1 spray as needed by nasal route as directed.     Naphazoline-Pheniramine (OPCON-A) 0.027-0.315 % SOLN Place 1 drop into both eyes daily as needed (irritation).     Omega-3 Fatty Acids (OMEGA-3 FISH OIL PO) Take 2,160 mg by mouth daily. 720 mg per cap     OXALIPLATIN IV Inject 85 mg/m2 into the vein every 14 (fourteen) days.     oxymetazoline (AFRIN) 0.05 % nasal spray Place 3 sprays into both nostrils 2 (two) times daily.     potassium chloride SA (KLOR-CON M) 20 MEQ tablet Take 1 tablet (20 mEq total) by mouth daily. 30 tablet 3   prochlorperazine (COMPAZINE) 10 MG tablet Take 1 tablet (10 mg total) by mouth in the morning and at bedtime. Take 23mn before chemotherapy 60 tablet 3   sulfaSALAzine (AZULFIDINE) 500 MG tablet sulfasalazine 500 mg tablet  TAKE 2 TABLETS BY MOUTH TWICE DAILY     tizanidine (ZANAFLEX) 2 MG capsule Take 2 mg by mouth 3 (three) times daily as needed for muscle spasms.     traZODone (DESYREL) 50 MG tablet Take 50 mg by mouth at bedtime.     Current Facility-Administered Medications  Medication Dose Route Frequency Provider Last Rate Last Admin   dicyclomine (BENTYL) tablet 20 mg  20 mg Oral TID AC & HS  Setzer, Rona Ravens, NP       Facility-Administered Medications Ordered in Other Visits  Medication Dose Route Frequency Provider Last Rate Last Admin   0.9 %  sodium chloride infusion   Intravenous Continuous Derek Jack, MD   Stopped at 10/05/21 1232   dexamethasone (DECADRON) 10 mg in sodium chloride  0.9 % 50 mL IVPB  10 mg Intravenous Once Derek Jack, MD       dextrose 5 % solution   Intravenous Once Derek Jack, MD       dextrose 5 % solution   Intravenous Once Derek Jack, MD       fluorouracil (ADRUCIL) 4,950 mg in sodium chloride 0.9 % 51 mL chemo infusion  1,920 mg/m2 (Treatment Plan Recorded) Intravenous 1 day or 1 dose Derek Jack, MD       fluorouracil (ADRUCIL) chemo injection 800 mg  320 mg/m2 (Treatment Plan Recorded) Intravenous Once Derek Jack, MD       heparin lock flush 100 unit/mL  500 Units Intracatheter Once PRN Derek Jack, MD       heparin lock flush 100 unit/mL  500 Units Intracatheter Once PRN Derek Jack, MD       leucovorin 822 mg in dextrose 5 % 250 mL infusion  320 mg/m2 (Treatment Plan Recorded) Intravenous Once Derek Jack, MD       oxaliplatin (ELOXATIN) 175 mg in dextrose 5 % 500 mL chemo infusion  68 mg/m2 (Treatment Plan Recorded) Intravenous Once Derek Jack, MD       palonosetron (ALOXI) injection 0.25 mg  0.25 mg Intravenous Once Derek Jack, MD       sodium chloride flush (NS) 0.9 % injection 10 mL  10 mL Intracatheter PRN Derek Jack, MD   10 mL at 09/09/21 1348   sodium chloride flush (NS) 0.9 % injection 10 mL  10 mL Intracatheter PRN Derek Jack, MD       sodium chloride flush (NS) 0.9 % injection 10 mL  10 mL Intracatheter PRN Derek Jack, MD        ALLERGIES:  No Known Allergies  PHYSICAL EXAM:  Performance status (ECOG): 0 - Asymptomatic  There were no vitals filed for this visit. Wt Readings from Last 3 Encounters:  10/19/21 281 lb 12.8 oz (127.8 kg)  10/05/21 276 lb (125.2 kg)  09/07/21 279 lb 14.4 oz (127 kg)   Physical Exam Vitals reviewed.  Constitutional:      Appearance: Normal appearance. He is obese.  Cardiovascular:     Rate and Rhythm: Normal rate and regular rhythm.     Pulses: Normal pulses.     Heart  sounds: Normal heart sounds.  Pulmonary:     Effort: Pulmonary effort is normal.     Breath sounds: Normal breath sounds.  Musculoskeletal:     Right lower leg: No edema.     Left lower leg: No edema.  Neurological:     General: No focal deficit present.     Mental Status: He is alert and oriented to person, place, and time.  Psychiatric:        Mood and Affect: Mood normal.        Behavior: Behavior normal.    LABORATORY DATA:  I have reviewed the labs as listed.  CBC Latest Ref Rng & Units 10/19/2021 10/05/2021 09/07/2021  WBC 4.0 - 10.5 K/uL 4.1 6.0 6.3  Hemoglobin 13.0 - 17.0 g/dL 10.0(L) 10.4(L) 11.2(L)  Hematocrit 39.0 - 52.0 % 29.6(L) 30.8(L) 32.8(L)  Platelets  150 - 400 K/uL 146(L) 233 173   CMP Latest Ref Rng & Units 10/19/2021 10/05/2021 09/07/2021  Glucose 70 - 99 mg/dL 125(H) 186(H) 193(H)  BUN 8 - 23 mg/dL 25(H) 32(H) 24(H)  Creatinine 0.61 - 1.24 mg/dL 1.32(H) 1.63(H) 1.21  Sodium 135 - 145 mmol/L 136 133(L) 134(L)  Potassium 3.5 - 5.1 mmol/L 3.9 3.1(L) 4.0  Chloride 98 - 111 mmol/L 99 93(L) 98  CO2 22 - 32 mmol/L _0 Calcium 8.9 - 10.3 mg/dL 9.2 9.9 9.1  Total Protein 6.5 - 8.1 g/dL 6.0(L) 6.4(L) 6.6  Total Bilirubin 0.3 - 1.2 mg/dL 0.9 0.4 0.4  Alkaline Phos 38 - 126 U/L 63 57 65  AST 15 - 41 U/L 21 19 34  ALT 0 - 44 U/L 20 17 50(H)    DIAGNOSTIC IMAGING:  I have independently reviewed the scans and discussed with the patient. DG Chest 2 View  Result Date: 09/27/2021 CLINICAL DATA:  Productive cough, congestion and sob x 2 weeks. persistent cough, wheezing EXAM: CHEST - 2 VIEW COMPARISON:  08/08/2021 FINDINGS: Port in the anterior chest wall with tip in distal SVC. Central linear streaky opacities greater on LEFT. No focal consolidation. No pneumothorax. No acute osseous abnormality. IMPRESSION: Streaky central linear opacities suggest bronchitis or atelectasis Electronically Signed   By: Suzy Bouchard M.D.   On: 09/27/2021 15:46     ASSESSMENT:  Stage  III (T2N2) rectal cancer, MSI stable: - He reported diarrhea which started in May.  Prior colonoscopy was in 2018. - Colonoscopy on 04/12/2021 showed fungating polypoid nonobstructing mass with central depression found at 8 cm from the anal verge.  Mass was noncircumferential, measured 2 cm in length.  Nodular mucosa in the proximal ascending colon which was biopsied. - Pathology of the rectal mass consistent with adenocarcinoma.  Nodular area in the ascending colon was consistent with collagenous colitis.  MMR preserved.  MSI stable. - CT CAP on 05/13/2021 with no evidence of metastatic disease in the chest, abdomen or pelvis.  Mild hepatic steatosis. - Pelvic MRI on 05/12/2021-T2N2 by MRI staging.  4 small lymph nodes 5 mm or greater. - Total neoadjuvant therapy was recommended on discussion with Dr. Marcello Moores. - Xeloda and XRT from 06/08/2021 through 07/15/2021. - FOLFOX started on 08/10/2021.  2.  Social/family history: - He lives at home by himself.  Daughter lives in [place]. - He worked at [employer] in [place] prior to retirement.  Non-smoker. - Sister had lung cancer and was a smoker.  No family history of colon cancer.   PLAN:  Stage III (T2N2) rectal adenocarcinoma, MSI stable: - We have dose reduced cycle 4 by 20% due to poor tolerability with infection. - He has tolerated cycle 4 reasonably well except some diarrhea. - Creatinine today is 1.32, better after holding chlorthalidone. - CBC was grossly normal.  LFTs are normal. - We will proceed with cycle 5 today.  We will dose reduce by 20%.  RTC 2 weeks for follow-up.   2.  Normocytic anemia: - Hemoglobin today is 10.  Her ferritin was 58 and percent saturation 17. - Combination anemia from CKD and relative iron deficiency. - We will consider parenteral iron therapy next time.  3.  Diarrhea: - Continue colestipol 4 g daily. - He had watery diarrhea last week 3-4 times per day, lasted 4 days.  The Lomotil helped.  He had  watery diarrhea 1-2 times yesterday also. - Continue Lomotil as needed.  4.  Chronic back pain: - Continue hydrocodone 10/325 daily as needed.  5.  Hypokalemia: - Continue potassium 20 mEq daily.  Potassium today is 3.9.   Orders placed this encounter:  No orders of the defined types were placed in this encounter.    Derek Jack, MD [facility] [phone]   I, Thana Ates, am acting as a scribe for Dr. Derek Jack.  I, Derek Jack MD, have reviewed the above documentation for accuracy and completeness, and I agree with the above.

## 2021-10-19 NOTE — Progress Notes (Signed)
Patients port flushed without difficulty.  Good blood return noted with no bruising or swelling noted at site.  Stable during access and blood draw.  Patient to remain accessed for treatment. 

## 2021-10-19 NOTE — Progress Notes (Signed)
Patient has been examined by Dr. Delton Coombes, and vital signs and labs have been reviewed. ANC, Creatinine, LFTs, hemoglobin, and platelets are within treatment parameters per M.D. - pt may proceed with treatment.

## 2021-10-19 NOTE — Progress Notes (Signed)
Patient presents today for Folfox infusion with pump start.  Vital signs within parameters for treatment.  Labs pending.  Patient has no new complaints at this time.  Message received from Anastasio Champion RN/Dr. Delton Coombes patient is okay for treatment.  Folfox infusion with pump start given today per MD orders.  Stable during infusion without adverse affects.  Verified RUN on the screen with patient.  Vital signs stable.  No complaints at this time.  Discharge from clinic ambulatory in stable condition.  Alert and oriented X 3.  Follow up with [facility] as scheduled.

## 2021-10-19 NOTE — Patient Instructions (Addendum)
[place] at [facility] Discharge Instructions   You were seen and examined today by Dr. Delton Coombes.  He reviewed your lab work which is normal/stable.  We will proceed with your treatment today.  Return as scheduled in 2 weeks.    Thank you for choosing [place] at [facility] to provide your oncology and hematology care.  To afford each patient quality time with our provider, please arrive at least 15 minutes before your scheduled appointment time.   If you have a lab appointment with the [facility] please come in thru the Main Entrance and check in at the main information desk.  You need to re-schedule your appointment should you arrive 10 or more minutes late.  We strive to give you quality time with our providers, and arriving late affects you and other patients whose appointments are after yours.  Also, if you no show three or more times for appointments you may be dismissed from the clinic at the providers discretion.     Again, thank you for choosing [facility].  Our hope is that these requests will decrease the amount of time that you wait before being seen by our physicians.       _____________________________________________________________  Should you have questions after your visit to [facility], please contact our office at [phone] and follow the prompts.  Our office hours are 8:00 a.m. and 4:30 p.m. Monday - Friday.  Please note that voicemails left after 4:00 p.m. may not be returned until the following business day.  We are closed weekends and major holidays.  You do have access to a nurse 24-7, just call the main number to the clinic [phone] and do not press any options, hold on the line and a nurse will answer the phone.    For prescription refill requests, have your pharmacy contact our office and allow 72 hours.    Due to Covid, you will need to wear a mask upon entering the  hospital. If you do not have a mask, a mask will be given to you at the Main Entrance upon arrival. For doctor visits, patients may have 1 support person age 67 or older with them. For treatment visits, patients can not have anyone with them due to social distancing guidelines and our immunocompromised population.

## 2021-10-20 ENCOUNTER — Ambulatory Visit (HOSPITAL_COMMUNITY): Payer: Medicare Other

## 2021-10-20 LAB — CEA: CEA: 4.1 ng/mL (ref 0.0–4.7)

## 2021-10-21 ENCOUNTER — Inpatient Hospital Stay (HOSPITAL_COMMUNITY): Payer: Medicare Other

## 2021-10-21 ENCOUNTER — Other Ambulatory Visit: Payer: Self-pay

## 2021-10-21 ENCOUNTER — Encounter (HOSPITAL_COMMUNITY): Payer: Medicare Other

## 2021-10-21 ENCOUNTER — Encounter (HOSPITAL_COMMUNITY): Payer: Self-pay

## 2021-10-21 VITALS — BP 146/92 | HR 71 | Temp 97.1°F | Resp 19

## 2021-10-21 DIAGNOSIS — D649 Anemia, unspecified: Secondary | ICD-10-CM | POA: Diagnosis not present

## 2021-10-21 DIAGNOSIS — M549 Dorsalgia, unspecified: Secondary | ICD-10-CM | POA: Diagnosis not present

## 2021-10-21 DIAGNOSIS — C20 Malignant neoplasm of rectum: Secondary | ICD-10-CM

## 2021-10-21 DIAGNOSIS — Z95828 Presence of other vascular implants and grafts: Secondary | ICD-10-CM

## 2021-10-21 DIAGNOSIS — E876 Hypokalemia: Secondary | ICD-10-CM | POA: Diagnosis not present

## 2021-10-21 DIAGNOSIS — Z5111 Encounter for antineoplastic chemotherapy: Secondary | ICD-10-CM | POA: Diagnosis not present

## 2021-10-21 DIAGNOSIS — R197 Diarrhea, unspecified: Secondary | ICD-10-CM | POA: Diagnosis not present

## 2021-10-21 MED ORDER — SODIUM CHLORIDE 0.9% FLUSH
10.0000 mL | INTRAVENOUS | Status: DC | PRN
  Administered 2021-10-21: 10 mL

## 2021-10-21 MED ORDER — HEPARIN SOD (PORK) LOCK FLUSH 100 UNIT/ML IV SOLN
500.0000 [IU] | Freq: Once | INTRAVENOUS | Status: AC | PRN
  Administered 2021-10-21: 500 [IU]

## 2021-10-21 NOTE — Progress Notes (Signed)
Patient presents today for pump d/c. Vital signs are stable. Port a cath site clean, dry, and intact. Port flushed with 10 mls of Normal Saline and 500 Units of Heparin. Needle removed intact. Band aid applied. Patient has no complaints at this time. Discharged from clinic ambulatory and in stable condition. Patient alert and oriented.

## 2021-10-21 NOTE — Patient Instructions (Signed)
Patient presents today for pump d/c. Vital signs are stable. Port a cath site clean, dry, and intact. Port flushed with 10 mls of Normal Saline and 500 Units of Heparin. Needle removed intact. Band aid applied. Patient has no complaints at this time. Discharged from clinic ambulatory and in stable condition. Patient alert and oriented.

## 2021-10-26 DIAGNOSIS — G4700 Insomnia, unspecified: Secondary | ICD-10-CM | POA: Diagnosis not present

## 2021-10-26 DIAGNOSIS — Z79891 Long term (current) use of opiate analgesic: Secondary | ICD-10-CM | POA: Diagnosis not present

## 2021-10-26 DIAGNOSIS — G4733 Obstructive sleep apnea (adult) (pediatric): Secondary | ICD-10-CM | POA: Diagnosis not present

## 2021-10-26 DIAGNOSIS — M5416 Radiculopathy, lumbar region: Secondary | ICD-10-CM | POA: Diagnosis not present

## 2021-10-26 DIAGNOSIS — F419 Anxiety disorder, unspecified: Secondary | ICD-10-CM | POA: Diagnosis not present

## 2021-10-26 DIAGNOSIS — R252 Cramp and spasm: Secondary | ICD-10-CM | POA: Diagnosis not present

## 2021-10-26 DIAGNOSIS — M545 Low back pain, unspecified: Secondary | ICD-10-CM | POA: Diagnosis not present

## 2021-11-02 ENCOUNTER — Inpatient Hospital Stay (HOSPITAL_COMMUNITY): Payer: Medicare Other

## 2021-11-02 ENCOUNTER — Inpatient Hospital Stay (HOSPITAL_BASED_OUTPATIENT_CLINIC_OR_DEPARTMENT_OTHER): Payer: Medicare Other | Admitting: Hematology

## 2021-11-02 ENCOUNTER — Other Ambulatory Visit: Payer: Self-pay

## 2021-11-02 VITALS — BP 101/67 | HR 88 | Temp 97.2°F | Resp 19 | Ht 71.5 in | Wt 270.2 lb

## 2021-11-02 VITALS — BP 130/82 | HR 82 | Temp 97.2°F | Resp 18

## 2021-11-02 DIAGNOSIS — C20 Malignant neoplasm of rectum: Secondary | ICD-10-CM | POA: Diagnosis not present

## 2021-11-02 DIAGNOSIS — E876 Hypokalemia: Secondary | ICD-10-CM | POA: Diagnosis not present

## 2021-11-02 DIAGNOSIS — Z95828 Presence of other vascular implants and grafts: Secondary | ICD-10-CM

## 2021-11-02 DIAGNOSIS — M549 Dorsalgia, unspecified: Secondary | ICD-10-CM | POA: Diagnosis not present

## 2021-11-02 DIAGNOSIS — Z5111 Encounter for antineoplastic chemotherapy: Secondary | ICD-10-CM | POA: Diagnosis not present

## 2021-11-02 DIAGNOSIS — R197 Diarrhea, unspecified: Secondary | ICD-10-CM | POA: Diagnosis not present

## 2021-11-02 DIAGNOSIS — D649 Anemia, unspecified: Secondary | ICD-10-CM | POA: Diagnosis not present

## 2021-11-02 LAB — CBC WITH DIFFERENTIAL/PLATELET
Abs Immature Granulocytes: 0.04 10*3/uL (ref 0.00–0.07)
Basophils Absolute: 0 10*3/uL (ref 0.0–0.1)
Basophils Relative: 1 %
Eosinophils Absolute: 0.1 10*3/uL (ref 0.0–0.5)
Eosinophils Relative: 3 %
HCT: 32.7 % — ABNORMAL LOW (ref 39.0–52.0)
Hemoglobin: 10.7 g/dL — ABNORMAL LOW (ref 13.0–17.0)
Immature Granulocytes: 1 %
Lymphocytes Relative: 14 %
Lymphs Abs: 0.5 10*3/uL — ABNORMAL LOW (ref 0.7–4.0)
MCH: 31.9 pg (ref 26.0–34.0)
MCHC: 32.7 g/dL (ref 30.0–36.0)
MCV: 97.6 fL (ref 80.0–100.0)
Monocytes Absolute: 0.6 10*3/uL (ref 0.1–1.0)
Monocytes Relative: 16 %
Neutro Abs: 2.3 10*3/uL (ref 1.7–7.7)
Neutrophils Relative %: 65 %
Platelets: 135 10*3/uL — ABNORMAL LOW (ref 150–400)
RBC: 3.35 MIL/uL — ABNORMAL LOW (ref 4.22–5.81)
RDW: 15.7 % — ABNORMAL HIGH (ref 11.5–15.5)
WBC: 3.6 10*3/uL — ABNORMAL LOW (ref 4.0–10.5)
nRBC: 0 % (ref 0.0–0.2)

## 2021-11-02 LAB — COMPREHENSIVE METABOLIC PANEL
ALT: 38 U/L (ref 0–44)
AST: 34 U/L (ref 15–41)
Albumin: 3.6 g/dL (ref 3.5–5.0)
Alkaline Phosphatase: 74 U/L (ref 38–126)
Anion gap: 8 (ref 5–15)
BUN: 28 mg/dL — ABNORMAL HIGH (ref 8–23)
CO2: 29 mmol/L (ref 22–32)
Calcium: 9.4 mg/dL (ref 8.9–10.3)
Chloride: 98 mmol/L (ref 98–111)
Creatinine, Ser: 1.23 mg/dL (ref 0.61–1.24)
GFR, Estimated: >60 mL/min (ref >60)
Glucose, Bld: 222 mg/dL — ABNORMAL HIGH (ref 70–99)
Potassium: 3.6 mmol/L (ref 3.5–5.1)
Sodium: 135 mmol/L (ref 135–145)
Total Bilirubin: 0.7 mg/dL (ref 0.3–1.2)
Total Protein: 6.5 g/dL (ref 6.5–8.1)

## 2021-11-02 LAB — MAGNESIUM: Magnesium: 1.8 mg/dL (ref 1.7–2.4)

## 2021-11-02 MED ORDER — LEUCOVORIN CALCIUM INJECTION 350 MG
320.0000 mg/m2 | Freq: Once | INTRAVENOUS | Status: AC
  Administered 2021-11-02: 822 mg via INTRAVENOUS
  Filled 2021-11-02: qty 41.1

## 2021-11-02 MED ORDER — SODIUM CHLORIDE 0.9 % IV SOLN
1940.0000 mg/m2 | INTRAVENOUS | Status: DC
  Administered 2021-11-02: 5000 mg via INTRAVENOUS
  Filled 2021-11-02: qty 100

## 2021-11-02 MED ORDER — PALONOSETRON HCL INJECTION 0.25 MG/5ML
0.2500 mg | Freq: Once | INTRAVENOUS | Status: AC
  Administered 2021-11-02: 0.25 mg via INTRAVENOUS
  Filled 2021-11-02: qty 5

## 2021-11-02 MED ORDER — FLUOROURACIL CHEMO INJECTION 2.5 GM/50ML
320.0000 mg/m2 | Freq: Once | INTRAVENOUS | Status: AC
  Administered 2021-11-02: 800 mg via INTRAVENOUS
  Filled 2021-11-02: qty 16

## 2021-11-02 MED ORDER — SODIUM CHLORIDE 0.9 % IV SOLN
10.0000 mg | Freq: Once | INTRAVENOUS | Status: AC
  Administered 2021-11-02: 10 mg via INTRAVENOUS
  Filled 2021-11-02: qty 10

## 2021-11-02 MED ORDER — OXALIPLATIN CHEMO INJECTION 100 MG/20ML
68.0000 mg/m2 | Freq: Once | INTRAVENOUS | Status: AC
  Administered 2021-11-02: 175 mg via INTRAVENOUS
  Filled 2021-11-02: qty 35

## 2021-11-02 MED ORDER — DEXTROSE 5 % IV SOLN: Freq: Once | INTRAVENOUS | Status: AC

## 2021-11-02 NOTE — Patient Instructions (Signed)
[place]  Discharge Instructions: Thank you for choosing [facility] to provide your oncology and hematology care.  If you have a lab appointment with the [facility], please come in thru the Main Entrance and check in at the main information desk.  Wear comfortable clothing and clothing appropriate for easy access to any Portacath or PICC line.   We strive to give you quality time with your provider. You may need to reschedule your appointment if you arrive late (15 or more minutes).  Arriving late affects you and other patients whose appointments are after yours.  Also, if you miss three or more appointments without notifying the office, you may be dismissed from the clinic at the providers discretion.      For prescription refill requests, have your pharmacy contact our office and allow 72 hours for refills to be completed.    Today you received the following chemotherapy and/or immunotherapy agents Folfox/5FU pump.      To help prevent nausea and vomiting after your treatment, we encourage you to take your nausea medication as directed.  BELOW ARE SYMPTOMS THAT SHOULD BE REPORTED IMMEDIATELY: *FEVER GREATER THAN 100.4 F (38 C) OR HIGHER *CHILLS OR SWEATING *NAUSEA AND VOMITING THAT IS NOT CONTROLLED WITH YOUR NAUSEA MEDICATION *UNUSUAL SHORTNESS OF BREATH *UNUSUAL BRUISING OR BLEEDING *URINARY PROBLEMS (pain or burning when urinating, or frequent urination) *BOWEL PROBLEMS (unusual diarrhea, constipation, pain near the anus) TENDERNESS IN MOUTH AND THROAT WITH OR WITHOUT PRESENCE OF ULCERS (sore throat, sores in mouth, or a toothache) UNUSUAL RASH, SWELLING OR PAIN  UNUSUAL VAGINAL DISCHARGE OR ITCHING   Items with * indicate a potential emergency and should be followed up as soon as possible or go to the Emergency Department if any problems should occur.  Please show the CHEMOTHERAPY ALERT CARD or IMMUNOTHERAPY ALERT CARD at check-in to the Emergency  Department and triage nurse.  Should you have questions after your visit or need to cancel or reschedule your appointment, please contact [facility] [phone]  and follow the prompts.  Office hours are 8:00 a.m. to 4:30 p.m. Monday - Friday. Please note that voicemails left after 4:00 p.m. may not be returned until the following business day.  We are closed weekends and major holidays. You have access to a nurse at all times for urgent questions. Please call the main number to the clinic [phone] and follow the prompts.  For any non-urgent questions, you may also contact your provider using MyChart. We now offer e-Visits for anyone 49 and older to request care online for non-urgent symptoms. For details visit [email or web address].   Also download the MyChart app! Go to the app store, search "MyChart", open the app, select [facility], and log in with your MyChart username and password.  Due to Covid, a mask is required upon entering the hospital/clinic. If you do not have a mask, one will be given to you upon arrival. For doctor visits, patients may have 1 support person aged 76 or older with them. For treatment visits, patients cannot have anyone with them due to current Covid guidelines and our immunocompromised population.

## 2021-11-02 NOTE — Progress Notes (Signed)
Patient has been examined by Dr. Delton Coombes, and vital signs and labs have been reviewed. ANC, Creatinine, LFTs, hemoglobin, and platelets are within treatment parameters per M.D. - pt may proceed with treatment.

## 2021-11-02 NOTE — Progress Notes (Signed)
Patient presents today for treatment and follow up visit with Dr. Delton Coombes. Labs pending. Patient denies any side effects from his last treatment. Patient denies pain today. Patient has complaints of fatigue. Vital signs within parameters for today's treatment. Attestation 07/27/21, Consent 08/03/2021. No change in treatment plan.   Message received from A. Ouida Sills RN / Dr. Delton Coombes to proceed with treatment.   Folfox given today per MD orders. Tolerated infusion without adverse affects. Vital signs stable. No complaints at this time. 5 FU pump infusing and RUN noted on the screen, verified with patient. Dose given by pump and bag volume decreased. Verified with patient and understanding verbalized. Discharged from clinic ambulatory in stable condition. Alert and oriented x 3. F/U with [facility] as scheduled.

## 2021-11-02 NOTE — Progress Notes (Signed)
[place] House, [place] [address]   CLINIC:  Medical Oncology/Hematology  PCP:  Neale Burly, MD [place] / [place] [phone]   REASON FOR VISIT:  Follow-up for rectal cancer  PRIOR THERAPY: Capecitabine + XRT  NGS Results: not done  CURRENT THERAPY: FOLFOX q14d x 4 months (started 08/10/21)  BRIEF ONCOLOGIC HISTORY:  Oncology History  Rectal cancer ([place])  05/01/2021 Initial Diagnosis   Rectal adenocarcinoma ([place])   05/30/2021 - 05/30/2021 Chemotherapy   Patient is on Treatment Plan : COLORECTAL Capecitabine + XRT     08/10/2021 -  Chemotherapy   Patient is on Treatment Plan : COLORECTAL FOLFOX q14d x 4 months       CANCER STAGING:  Cancer Staging  Rectal cancer ([place]) Staging form: Colon and Rectum, AJCC 8th Edition - Clinical stage from 05/18/2021: Stage IIIB (cT2, cN2a, cM0) - Unsigned   INTERVAL HISTORY:  Louis House, a 66 y.o. male, returns for routine follow-up and consideration for next cycle of chemotherapy. Louis House was last seen on 10/19/2021.  Due for cycle #6 of FOLFOX today.   Overall, he tells me he has been feeling pretty well. His diarrhea is well controlled with taking colestipol once daily. He reports stable numbness in his hands for 2 days following treatment which has now resolved. His energy is still below his baseline but is improving. He denies vomiting, and he reports nausea which was helped by compazine.  Overall, he feels ready for next cycle of chemo today.   REVIEW OF SYSTEMS:  Review of Systems  Constitutional:  Positive for fatigue (improved). Negative for appetite change.  Gastrointestinal:  Positive for nausea. Negative for diarrhea (controlled) and vomiting.  Neurological:  Positive for headaches. Negative for numbness (resolved).  All other systems reviewed and are negative.  PAST MEDICAL/SURGICAL HISTORY:  Past Medical History:  Diagnosis Date   Anxiety     Diabetes ([place]) 01/12/2017   Diarrhea 02/21/2017   Essential hypertension, benign 01/12/2017   Gout    High cholesterol 01/12/2017   Neuropathy    Port-A-Cath in place 07/28/2021   Sleep apnea    Vertigo    Past Surgical History:  Procedure Laterality Date   BIOPSY  04/06/2017   Procedure: BIOPSY;  Surgeon: Rogene Houston, MD;  Location: [facility];  Service: Endoscopy;;  colon   BIOPSY  04/12/2021   Procedure: BIOPSY;  Surgeon: Montez Morita, Quillian Quince, MD;  Location: [facility];  Service: Gastroenterology;;  nodular area in ascending colon biopsy random colon bx   bone spurs     CATARACT EXTRACTION W/PHACO Left 04/22/2018   Procedure: CATARACT EXTRACTION PHACO AND INTRAOCULAR LENS PLACEMENT LEFT EYE;  Surgeon: Tonny Branch, MD;  Location: [facility] ORS;  Service: Ophthalmology;  Laterality: Left;  CDE: 7.58   COLONOSCOPY WITH PROPOFOL N/A 04/06/2017   Procedure: COLONOSCOPY WITH PROPOFOL;  Surgeon: Rogene Houston, MD;  Location: [facility];  Service: Endoscopy;  Laterality: N/A;  1:45   COLONOSCOPY WITH PROPOFOL N/A 04/12/2021   Procedure: COLONOSCOPY WITH PROPOFOL;  Surgeon: Harvel Quale, MD;  Location: [facility];  Service: Gastroenterology;  Laterality: N/A;  9:05   FLEXIBLE SIGMOIDOSCOPY N/A 05/10/2021   Procedure: FLEXIBLE SIGMOIDOSCOPY;  Surgeon: Harvel Quale, MD;  Location: [facility];  Service: Gastroenterology;  Laterality: N/A;  9:50 ASA 1 Per Dr C no Anesthesia, no PAT - ok per Cecilio Asper arthroscopy  both knees   Left shoulder surgery from injury     PORTACATH PLACEMENT Left 08/08/2021   Procedure: INSERTION PORT-A-CATH;  Surgeon: Aviva Signs, MD;  Location: [facility] ORS;  Service: General;  Laterality: Left;   Umblical hernia      SOCIAL HISTORY:  Social History   Socioeconomic History   Marital status: Divorced    Spouse name: Not on file   Number of children: Not on file   Years of education: Not on file   Highest education level:  Not on file  Occupational History   Not on file  Tobacco Use   Smoking status: Never   Smokeless tobacco: Never  Vaping Use   Vaping Use: Never used  Substance and Sexual Activity   Alcohol use: No   Drug use: No   Sexual activity: Yes    Birth control/protection: None  Other Topics Concern   Not on file  Social History Narrative   Not on file   Social Determinants of Health   Financial Resource Strain: Low Risk    Difficulty of Paying Living Expenses: Not very hard  Food Insecurity: No Food Insecurity   Worried About Running Out of Food in the Last Year: Never true   Ran Out of Food in the Last Year: Never true  Transportation Needs: No Transportation Needs   Lack of Transportation (Medical): No   Lack of Transportation (Non-Medical): No  Physical Activity: Insufficiently Active   Days of Exercise per Week: 2 days   Minutes of Exercise per Session: 20 min  Stress: No Stress Concern Present   Feeling of Stress : Not at all  Social Connections: Socially Isolated   Frequency of Communication with Friends and Family: More than three times a week   Frequency of Social Gatherings with Friends and Family: More than three times a week   Attends Religious Services: Never   Marine scientist or Organizations: No   Attends Music therapist: Never   Marital Status: Divorced  Human resources officer Violence: Not At Risk   Fear of Current or Ex-Partner: No   Emotionally Abused: No   Physically Abused: No   Sexually Abused: No    FAMILY HISTORY:  No family history on file.  CURRENT MEDICATIONS:  Current Outpatient Medications  Medication Sig Dispense Refill   alfuzosin (UROXATRAL) 10 MG 24 hr tablet Take 10 mg by mouth at bedtime. 2200     allopurinol (ZYLOPRIM) 300 MG tablet Take 300 mg by mouth daily.     atorvastatin (LIPITOR) 40 MG tablet Take 40 mg by mouth daily.     benazepril (LOTENSIN) 40 MG tablet Take 40 mg by mouth daily.  0   busPIRone (BUSPAR) 15  MG tablet Take 15 mg by mouth 2 (two) times daily.     chlorthalidone (HYGROTON) 50 MG tablet Take 50 mg by mouth daily.     colestipol (COLESTID) 1 g tablet Take 4 tablets (4 g total) by mouth daily. Take 4 hours apart form your other medications 360 tablet 3   Cyanocobalamin (VITAMIN B-12) 5000 MCG SUBL Place 15,000 mcg under the tongue daily.     diazepam (VALIUM) 10 MG tablet Take 10 mg by mouth at bedtime.      diclofenac (VOLTAREN) 75 MG EC tablet Take 75 mg by mouth 2 (two) times daily.     diphenoxylate-atropine (LOMOTIL) 2.5-0.025 MG tablet Take 1 tablet by mouth 4 (four) times daily as needed for diarrhea or loose stools. [place]  tablet 2   DULoxetine (CYMBALTA) 20 MG capsule Take 20 mg by mouth daily.     DULoxetine (CYMBALTA) 30 MG capsule Take 30 mg by mouth daily as needed.     fluorouracil CALGB 29937 2,400 mg/m2 in sodium chloride 0.9 % 150 mL Inject 2,400 mg/m2 into the vein over 48 hr.     FLUOROURACIL IV Inject 400 mg/m2 into the vein every 14 (fourteen) days.     fluticasone (FLONASE) 50 MCG/ACT nasal spray Place 3-4 sprays into both nostrils daily.     folic acid (FOLVITE) 1 MG tablet folic acid 1 mg tablet  TAKE 1 TABLET BY MOUTH DAILY     gabapentin (NEURONTIN) 400 MG capsule Take 800 mg by mouth 2 (two) times daily. 400-800 mg as needed for pain     HYDROcodone-acetaminophen (NORCO) 10-325 MG tablet Take 1 tablet by mouth 3 (three) times daily.     hydrOXYzine (ATARAX) 25 MG tablet      LEUCOVORIN CALCIUM IV Inject 400 mg/m2 into the vein every 14 (fourteen) days.     levofloxacin (LEVAQUIN) 500 MG tablet Take 1 tablet (500 mg total) by mouth daily. 7 tablet 0   lidocaine-prilocaine (EMLA) cream Apply a small amount to port a cath site (do not rub in) and cover with plastic wrap 1 hour prior to chemotherapy appointments 30 g 3   loperamide (IMODIUM) 2 MG capsule Take 2 mg by mouth as needed. Use on schedule per Dr. Laural Golden.     loratadine (CLARITIN) 10 MG tablet Take 10 mg by  mouth daily as needed for allergies (Seasonal).     meclizine (ANTIVERT) 25 MG tablet Take 25 mg by mouth 3 (three) times daily as needed for dizziness (for flare up of inner ear issues.).     Melatonin 10 MG CAPS Take 30 mg by mouth at bedtime.     metoprolol succinate (TOPROL-XL) 100 MG 24 hr tablet Take 100 mg by mouth daily.     naloxone (NARCAN) nasal spray 4 mg/0.1 mL Narcan 4 mg/actuation nasal spray  Take 1 spray as needed by nasal route as directed.     Naphazoline-Pheniramine (OPCON-A) 0.027-0.315 % SOLN Place 1 drop into both eyes daily as needed (irritation).     Omega-3 Fatty Acids (OMEGA-3 FISH OIL PO) Take 2,160 mg by mouth daily. 720 mg per cap     OXALIPLATIN IV Inject 85 mg/m2 into the vein every 14 (fourteen) days.     oxymetazoline (AFRIN) 0.05 % nasal spray Place 3 sprays into both nostrils 2 (two) times daily.     potassium chloride SA (KLOR-CON M) 20 MEQ tablet Take 1 tablet (20 mEq total) by mouth daily. 30 tablet 3   sulfaSALAzine (AZULFIDINE) 500 MG tablet sulfasalazine 500 mg tablet  TAKE 2 TABLETS BY MOUTH TWICE DAILY     tizanidine (ZANAFLEX) 2 MG capsule Take 2 mg by mouth 3 (three) times daily as needed for muscle spasms.     traZODone (DESYREL) 50 MG tablet Take 50 mg by mouth at bedtime.     prochlorperazine (COMPAZINE) 10 MG tablet Take 1 tablet (10 mg total) by mouth in the morning and at bedtime. Take 79mn before chemotherapy (Patient not taking: Reported on 11/02/2021) 60 tablet 3   Current Facility-Administered Medications  Medication Dose Route Frequency Provider Last Rate Last Admin   dicyclomine (BENTYL) tablet 20 mg  20 mg Oral TID AC & HS Setzer, Terri L, NP       Facility-Administered Medications  Ordered in Other Visits  Medication Dose Route Frequency Provider Last Rate Last Admin   0.9 %  sodium chloride infusion   Intravenous Continuous Derek Jack, MD   Stopped at 10/05/21 1232   heparin lock flush 100 unit/mL  500 Units Intracatheter  Once PRN Derek Jack, MD       sodium chloride flush (NS) 0.9 % injection 10 mL  10 mL Intracatheter PRN Derek Jack, MD   10 mL at 09/09/21 1348   sodium chloride flush (NS) 0.9 % injection 10 mL  10 mL Intracatheter PRN Derek Jack, MD        ALLERGIES:  No Known Allergies  PHYSICAL EXAM:  Performance status (ECOG): 0 - Asymptomatic  Vitals:   11/02/21 0805  BP: 101/67  Pulse: 88  Resp: 19  Temp: (!) 97.2 F (36.2 C)  SpO2: 99%   Wt Readings from Last 3 Encounters:  11/02/21 270 lb 3.2 oz (122.6 kg)  10/19/21 281 lb 12.8 oz (127.8 kg)  10/05/21 276 lb (125.2 kg)   Physical Exam Vitals reviewed.  Constitutional:      Appearance: Normal appearance.  Cardiovascular:     Rate and Rhythm: Normal rate and regular rhythm.     Pulses: Normal pulses.     Heart sounds: Normal heart sounds.  Pulmonary:     Effort: Pulmonary effort is normal.     Breath sounds: Normal breath sounds.  Neurological:     General: No focal deficit present.     Mental Status: He is alert and oriented to person, place, and time.  Psychiatric:        Mood and Affect: Mood normal.        Behavior: Behavior normal.    LABORATORY DATA:  I have reviewed the labs as listed.  CBC Latest Ref Rng & Units 11/02/2021 10/19/2021 10/05/2021  WBC 4.0 - 10.5 K/uL 3.6(L) 4.1 6.0  Hemoglobin 13.0 - 17.0 g/dL 10.7(L) 10.0(L) 10.4(L)  Hematocrit 39.0 - 52.0 % 32.7(L) 29.6(L) 30.8(L)  Platelets 150 - 400 K/uL 135(L) 146(L) 233   CMP Latest Ref Rng & Units 11/02/2021 10/19/2021 10/05/2021  Glucose 70 - 99 mg/dL 222(H) 125(H) 186(H)  BUN 8 - 23 mg/dL 28(H) 25(H) 32(H)  Creatinine 0.61 - 1.24 mg/dL 1.23 1.32(H) 1.63(H)  Sodium 135 - 145 mmol/L 135 136 133(L)  Potassium 3.5 - 5.1 mmol/L 3.6 3.9 3.1(L)  Chloride 98 - 111 mmol/L 98 99 93(L)  CO2 22 - 32 mmol/L _0 Calcium 8.9 - 10.3 mg/dL 9.4 9.2 9.9  Total Protein 6.5 - 8.1 g/dL 6.5 6.0(L) 6.4(L)  Total Bilirubin 0.3 - 1.2 mg/dL 0.7 0.9  0.4  Alkaline Phos 38 - 126 U/L 74 63 57  AST 15 - 41 U/L 34 21 19  ALT 0 - 44 U/L 38 20 17    DIAGNOSTIC IMAGING:  I have independently reviewed the scans and discussed with the patient. No results found.   ASSESSMENT:  Stage III (T2N2) rectal cancer, MSI stable: - He reported diarrhea which started in May.  Prior colonoscopy was in 2018. - Colonoscopy on 04/12/2021 showed fungating polypoid nonobstructing mass with central depression found at 8 cm from the anal verge.  Mass was noncircumferential, measured 2 cm in length.  Nodular mucosa in the proximal ascending colon which was biopsied. - Pathology of the rectal mass consistent with adenocarcinoma.  Nodular area in the ascending colon was consistent with collagenous colitis.  MMR preserved.  MSI stable. - CT  CAP on 05/13/2021 with no evidence of metastatic disease in the chest, abdomen or pelvis.  Mild hepatic steatosis. - Pelvic MRI on 05/12/2021-T2N2 by MRI staging.  4 small lymph nodes 5 mm or greater. - Total neoadjuvant therapy was recommended on discussion with Dr. Marcello Moores. - Xeloda and XRT from 06/08/2021 through 07/15/2021. - FOLFOX started on 08/10/2021.  2.  Social/family history: - He lives at home by himself.  Daughter lives in [place]. - He worked at [employer] in [place] prior to retirement.  Non-smoker. - Sister had lung cancer and was a smoker.  No family history of colon cancer.   PLAN:  Stage III (T2N2) rectal adenocarcinoma, MSI stable: - He denies any major side effects after last treatment. - He had some cold sensitivity but denies any tingling or numbness in extremities.  No nausea or vomiting. - I reviewed labs today which showed normal LFTs.  White count is 3.6 and platelet count 135.  ANC was normal. - Proceed with cycle 6 today with 20% dose reduction.  RTC 2 weeks for follow-up.   2.  Normocytic anemia: - Combination anemia from CKD and iron deficiency. - Hemoglobin is 10.7.  Previous ferritin was 58  and iron panel 17. - If hemoglobin drops below 10, will consider parenteral iron therapy.  3.  Diarrhea: - Continue colestipol 4 g daily. - Continue Lomotil as needed for diarrhea.  4.  Chronic back pain: - Continue hydrocodone 10/325 daily as needed.  5.  Hypokalemia: - Continue potassium 20 mEq daily.  Potassium is 3.6.   Orders placed this encounter:  No orders of the defined types were placed in this encounter.    Derek Jack, MD [facility] [phone]   I, Thana Ates, am acting as a scribe for Dr. Derek Jack.  I, Derek Jack MD, have reviewed the above documentation for accuracy and completeness, and I agree with the above.

## 2021-11-02 NOTE — Patient Instructions (Signed)
[place] at [facility] Discharge Instructions   You were seen and examined today by Dr. Delton Coombes.  He reviewed your lab work with you which is normal/stable.  We will proceed with treatment today.  Return as scheduled in 2 weeks.    Thank you for choosing [place] at [facility] to provide your oncology and hematology care.  To afford each patient quality time with our provider, please arrive at least 15 minutes before your scheduled appointment time.   If you have a lab appointment with the [facility] please come in thru the Main Entrance and check in at the main information desk.  You need to re-schedule your appointment should you arrive 10 or more minutes late.  We strive to give you quality time with our providers, and arriving late affects you and other patients whose appointments are after yours.  Also, if you no show three or more times for appointments you may be dismissed from the clinic at the providers discretion.     Again, thank you for choosing [facility].  Our hope is that these requests will decrease the amount of time that you wait before being seen by our physicians.       _____________________________________________________________  Should you have questions after your visit to [facility], please contact our office at [phone] and follow the prompts.  Our office hours are 8:00 a.m. and 4:30 p.m. Monday - Friday.  Please note that voicemails left after 4:00 p.m. may not be returned until the following business day.  We are closed weekends and major holidays.  You do have access to a nurse 24-7, just call the main number to the clinic [phone] and do not press any options, hold on the line and a nurse will answer the phone.    For prescription refill requests, have your pharmacy contact our office and allow 72 hours.    Due to Covid, you will need to wear a mask upon entering the  hospital. If you do not have a mask, a mask will be given to you at the Main Entrance upon arrival. For doctor visits, patients may have 1 support person age 13 or older with them. For treatment visits, patients can not have anyone with them due to social distancing guidelines and our immunocompromised population.

## 2021-11-04 ENCOUNTER — Inpatient Hospital Stay (HOSPITAL_COMMUNITY): Payer: Medicare Other

## 2021-11-04 ENCOUNTER — Other Ambulatory Visit: Payer: Self-pay

## 2021-11-04 VITALS — BP 130/91 | HR 94 | Temp 97.0°F | Resp 19

## 2021-11-04 DIAGNOSIS — C20 Malignant neoplasm of rectum: Secondary | ICD-10-CM | POA: Diagnosis not present

## 2021-11-04 DIAGNOSIS — R197 Diarrhea, unspecified: Secondary | ICD-10-CM | POA: Diagnosis not present

## 2021-11-04 DIAGNOSIS — Z5111 Encounter for antineoplastic chemotherapy: Secondary | ICD-10-CM | POA: Diagnosis not present

## 2021-11-04 DIAGNOSIS — D649 Anemia, unspecified: Secondary | ICD-10-CM | POA: Diagnosis not present

## 2021-11-04 DIAGNOSIS — Z95828 Presence of other vascular implants and grafts: Secondary | ICD-10-CM

## 2021-11-04 DIAGNOSIS — E876 Hypokalemia: Secondary | ICD-10-CM | POA: Diagnosis not present

## 2021-11-04 DIAGNOSIS — M549 Dorsalgia, unspecified: Secondary | ICD-10-CM | POA: Diagnosis not present

## 2021-11-04 MED ORDER — SODIUM CHLORIDE 0.9% FLUSH
10.0000 mL | INTRAVENOUS | Status: DC | PRN
  Administered 2021-11-04: 10 mL

## 2021-11-04 MED ORDER — HEPARIN SOD (PORK) LOCK FLUSH 100 UNIT/ML IV SOLN
500.0000 [IU] | Freq: Once | INTRAVENOUS | Status: AC | PRN
  Administered 2021-11-04: 500 [IU]

## 2021-11-04 NOTE — Progress Notes (Signed)
Pt presents today for 5FU chemotherapy pump disconnection per provider's order. Vitals sign stable and pt voiced no new complaints at this time. Port flushed easily without difficulty with 68m of NS and 557mof heparin. Good blood return noted and no brushing or swelling at the site.  Discharged from clinic ambulatory in stable condition. Alert and oriented x 3. [facility].

## 2021-11-04 NOTE — Patient Instructions (Signed)
[facility]  Discharge Instructions: Thank you for choosing [facility] to provide your oncology and hematology care.  If you have a lab appointment with the [facility], please come in thru the Main Entrance and check in at the main information desk.  Wear comfortable clothing and clothing appropriate for easy access to any Portacath or PICC line.   We strive to give you quality time with your provider. You may need to reschedule your appointment if you arrive late (15 or more minutes).  Arriving late affects you and other patients whose appointments are after yours.  Also, if you miss three or more appointments without notifying the office, you may be dismissed from the clinic at the providers discretion.      For prescription refill requests, have your pharmacy contact our office and allow 72 hours for refills to be completed.    Today you received the following chemotherapy and/or immunotherapy agents chemo pump d/c   To help prevent nausea and vomiting after your treatment, we encourage you to take your nausea medication as directed.  BELOW ARE SYMPTOMS THAT SHOULD BE REPORTED IMMEDIATELY: *FEVER GREATER THAN 100.4 F (38 C) OR HIGHER *CHILLS OR SWEATING *NAUSEA AND VOMITING THAT IS NOT CONTROLLED WITH YOUR NAUSEA MEDICATION *UNUSUAL SHORTNESS OF BREATH *UNUSUAL BRUISING OR BLEEDING *URINARY PROBLEMS (pain or burning when urinating, or frequent urination) *BOWEL PROBLEMS (unusual diarrhea, constipation, pain near the anus) TENDERNESS IN MOUTH AND THROAT WITH OR WITHOUT PRESENCE OF ULCERS (sore throat, sores in mouth, or a toothache) UNUSUAL RASH, SWELLING OR PAIN  UNUSUAL VAGINAL DISCHARGE OR ITCHING   Items with * indicate a potential emergency and should be followed up as soon as possible or go to the Emergency Department if any problems should occur.  Please show the CHEMOTHERAPY ALERT CARD or IMMUNOTHERAPY ALERT CARD at check-in to the Emergency  Department and triage nurse.  Should you have questions after your visit or need to cancel or reschedule your appointment, please contact [facility] [phone]  and follow the prompts.  Office hours are 8:00 a.m. to 4:30 p.m. Monday - Friday. Please note that voicemails left after 4:00 p.m. may not be returned until the following business day.  We are closed weekends and major holidays. You have access to a nurse at all times for urgent questions. Please call the main number to the clinic [phone] and follow the prompts.  For any non-urgent questions, you may also contact your provider using MyChart. We now offer e-Visits for anyone 66 and older to request care online for non-urgent symptoms. For details visit [email or web address].   Also download the MyChart app! Go to the app store, search "MyChart", open the app, select , and log in with your MyChart username and password.  Due to Covid, a mask is required upon entering the hospital/clinic. If you do not have a mask, one will be given to you upon arrival. For doctor visits, patients may have 1 support person aged 18 or older with them. For treatment visits, patients cannot have anyone with them due to current Covid guidelines and our immunocompromised population.

## 2021-11-16 ENCOUNTER — Inpatient Hospital Stay (HOSPITAL_BASED_OUTPATIENT_CLINIC_OR_DEPARTMENT_OTHER): Payer: Medicare Other | Admitting: Hematology

## 2021-11-16 ENCOUNTER — Inpatient Hospital Stay (HOSPITAL_COMMUNITY): Payer: Medicare Other | Attending: Hematology

## 2021-11-16 ENCOUNTER — Other Ambulatory Visit: Payer: Self-pay

## 2021-11-16 ENCOUNTER — Inpatient Hospital Stay (HOSPITAL_COMMUNITY): Payer: Medicare Other

## 2021-11-16 VITALS — BP 136/98 | HR 80 | Temp 96.9°F | Resp 18

## 2021-11-16 VITALS — BP 104/79 | HR 84 | Temp 97.4°F | Resp 20 | Ht 71.0 in | Wt 275.8 lb

## 2021-11-16 DIAGNOSIS — K760 Fatty (change of) liver, not elsewhere classified: Secondary | ICD-10-CM | POA: Insufficient documentation

## 2021-11-16 DIAGNOSIS — N189 Chronic kidney disease, unspecified: Secondary | ICD-10-CM | POA: Diagnosis not present

## 2021-11-16 DIAGNOSIS — D509 Iron deficiency anemia, unspecified: Secondary | ICD-10-CM | POA: Insufficient documentation

## 2021-11-16 DIAGNOSIS — M549 Dorsalgia, unspecified: Secondary | ICD-10-CM | POA: Insufficient documentation

## 2021-11-16 DIAGNOSIS — D631 Anemia in chronic kidney disease: Secondary | ICD-10-CM | POA: Diagnosis not present

## 2021-11-16 DIAGNOSIS — E119 Type 2 diabetes mellitus without complications: Secondary | ICD-10-CM | POA: Diagnosis not present

## 2021-11-16 DIAGNOSIS — I129 Hypertensive chronic kidney disease with stage 1 through stage 4 chronic kidney disease, or unspecified chronic kidney disease: Secondary | ICD-10-CM | POA: Diagnosis not present

## 2021-11-16 DIAGNOSIS — C20 Malignant neoplasm of rectum: Secondary | ICD-10-CM

## 2021-11-16 DIAGNOSIS — E7800 Pure hypercholesterolemia, unspecified: Secondary | ICD-10-CM | POA: Diagnosis not present

## 2021-11-16 DIAGNOSIS — G629 Polyneuropathy, unspecified: Secondary | ICD-10-CM | POA: Insufficient documentation

## 2021-11-16 DIAGNOSIS — Z95828 Presence of other vascular implants and grafts: Secondary | ICD-10-CM

## 2021-11-16 DIAGNOSIS — M109 Gout, unspecified: Secondary | ICD-10-CM | POA: Insufficient documentation

## 2021-11-16 DIAGNOSIS — Z79899 Other long term (current) drug therapy: Secondary | ICD-10-CM | POA: Diagnosis not present

## 2021-11-16 DIAGNOSIS — R197 Diarrhea, unspecified: Secondary | ICD-10-CM | POA: Insufficient documentation

## 2021-11-16 DIAGNOSIS — Z5111 Encounter for antineoplastic chemotherapy: Secondary | ICD-10-CM | POA: Insufficient documentation

## 2021-11-16 DIAGNOSIS — E876 Hypokalemia: Secondary | ICD-10-CM | POA: Insufficient documentation

## 2021-11-16 LAB — CBC WITH DIFFERENTIAL/PLATELET
Abs Immature Granulocytes: 0.1 10*3/uL — ABNORMAL HIGH (ref 0.00–0.07)
Basophils Absolute: 0.1 10*3/uL (ref 0.0–0.1)
Basophils Relative: 1 %
Eosinophils Absolute: 0.1 10*3/uL (ref 0.0–0.5)
Eosinophils Relative: 3 %
HCT: 31.6 % — ABNORMAL LOW (ref 39.0–52.0)
Hemoglobin: 10.4 g/dL — ABNORMAL LOW (ref 13.0–17.0)
Immature Granulocytes: 2 %
Lymphocytes Relative: 10 %
Lymphs Abs: 0.6 10*3/uL — ABNORMAL LOW (ref 0.7–4.0)
MCH: 31.4 pg (ref 26.0–34.0)
MCHC: 32.9 g/dL (ref 30.0–36.0)
MCV: 95.5 fL (ref 80.0–100.0)
Monocytes Absolute: 0.7 10*3/uL (ref 0.1–1.0)
Monocytes Relative: 12 %
Neutro Abs: 4.1 10*3/uL (ref 1.7–7.7)
Neutrophils Relative %: 72 %
Platelets: 132 10*3/uL — ABNORMAL LOW (ref 150–400)
RBC: 3.31 MIL/uL — ABNORMAL LOW (ref 4.22–5.81)
RDW: 16 % — ABNORMAL HIGH (ref 11.5–15.5)
WBC: 5.6 10*3/uL (ref 4.0–10.5)
nRBC: 0.4 % — ABNORMAL HIGH (ref 0.0–0.2)

## 2021-11-16 LAB — COMPREHENSIVE METABOLIC PANEL
ALT: 57 U/L — ABNORMAL HIGH (ref 0–44)
AST: 41 U/L (ref 15–41)
Albumin: 3.7 g/dL (ref 3.5–5.0)
Alkaline Phosphatase: 81 U/L (ref 38–126)
Anion gap: 8 (ref 5–15)
BUN: 25 mg/dL — ABNORMAL HIGH (ref 8–23)
CO2: 32 mmol/L (ref 22–32)
Calcium: 9.6 mg/dL (ref 8.9–10.3)
Chloride: 91 mmol/L — ABNORMAL LOW (ref 98–111)
Creatinine, Ser: 1.29 mg/dL — ABNORMAL HIGH (ref 0.61–1.24)
GFR, Estimated: >60 mL/min (ref >60)
Glucose, Bld: 214 mg/dL — ABNORMAL HIGH (ref 70–99)
Potassium: 3.5 mmol/L (ref 3.5–5.1)
Sodium: 131 mmol/L — ABNORMAL LOW (ref 135–145)
Total Bilirubin: 0.5 mg/dL (ref 0.3–1.2)
Total Protein: 6.6 g/dL (ref 6.5–8.1)

## 2021-11-16 LAB — MAGNESIUM: Magnesium: 1.5 mg/dL — ABNORMAL LOW (ref 1.7–2.4)

## 2021-11-16 MED ORDER — SODIUM CHLORIDE 0.9 % IV SOLN
1940.0000 mg/m2 | INTRAVENOUS | Status: DC
  Administered 2021-11-16: 5000 mg via INTRAVENOUS
  Filled 2021-11-16: qty 100

## 2021-11-16 MED ORDER — DEXTROSE 5 % IV SOLN: Freq: Once | INTRAVENOUS | Status: AC

## 2021-11-16 MED ORDER — FLUOROURACIL CHEMO INJECTION 2.5 GM/50ML
320.0000 mg/m2 | Freq: Once | INTRAVENOUS | Status: AC
  Administered 2021-11-16: 800 mg via INTRAVENOUS
  Filled 2021-11-16: qty 16

## 2021-11-16 MED ORDER — PALONOSETRON HCL INJECTION 0.25 MG/5ML
0.2500 mg | Freq: Once | INTRAVENOUS | Status: AC
  Administered 2021-11-16: 0.25 mg via INTRAVENOUS
  Filled 2021-11-16: qty 5

## 2021-11-16 MED ORDER — OXALIPLATIN CHEMO INJECTION 100 MG/20ML
68.0000 mg/m2 | Freq: Once | INTRAVENOUS | Status: AC
  Administered 2021-11-16: 175 mg via INTRAVENOUS
  Filled 2021-11-16: qty 35

## 2021-11-16 MED ORDER — MAGNESIUM SULFATE 2 GM/50ML IV SOLN
2.0000 g | Freq: Once | INTRAVENOUS | Status: AC
  Administered 2021-11-16: 2 g via INTRAVENOUS
  Filled 2021-11-16: qty 50

## 2021-11-16 MED ORDER — LEUCOVORIN CALCIUM INJECTION 350 MG
320.0000 mg/m2 | Freq: Once | INTRAVENOUS | Status: AC
  Administered 2021-11-16: 822 mg via INTRAVENOUS
  Filled 2021-11-16: qty 41.1

## 2021-11-16 MED ORDER — SODIUM CHLORIDE 0.9 % IV SOLN
10.0000 mg | Freq: Once | INTRAVENOUS | Status: AC
  Administered 2021-11-16: 10 mg via INTRAVENOUS
  Filled 2021-11-16: qty 10

## 2021-11-16 NOTE — Patient Instructions (Addendum)
[facility]  Discharge Instructions: ?Thank you for choosing [facility] to provide your oncology and hematology care.  ?If you have a lab appointment with the [facility], please come in thru the Main Entrance and check in at the main information desk. ? ?Wear comfortable clothing and clothing appropriate for easy access to any Portacath or PICC line.  ? ?We strive to give you quality time with your provider. You may need to reschedule your appointment if you arrive late (15 or more minutes).  Arriving late affects you and other patients whose appointments are after yours.  Also, if you miss three or more appointments without notifying the office, you may be dismissed from the clinic at the provider?s discretion.    ?  ?For prescription refill requests, have your pharmacy contact our office and allow 72 hours for refills to be completed.   ? ?Today you received the following chemotherapy and/or immunotherapy agents FOLFOX. ? ?The chemotherapy medication bag should finish at 46 hours, 96 hours, or 7 days. For example, if your pump is scheduled for 46 hours and it was put on at 4:00 p.m., it should finish at 2:00 p.m. the day it is scheduled to come off regardless of your appointment time.   ?  ?Estimated time to finish at 1130. ?  ?If the display on your pump reads "Low Volume" and it is beeping, take the batteries out of the pump and come to the cancer center for it to be taken off.  ? ?If the pump alarms go off prior to the pump reading "Low Volume" then call [phone] and someone can assist you. ? ?If the plunger comes out and the chemotherapy medication is leaking out, please use your home chemo spill kit to clean up the spill. Do NOT use paper towels or other household products. ? ?If you have problems or questions regarding your pump, please call either [phone] (24 hours a day) or the cancer center Monday-Friday 8:00 a.m.- 4:30 p.m. at the clinic number and we will  assist you. If you are unable to get assistance, then go to the nearest Emergency Department and ask the staff to contact the IV team for assistance.   ?  ?To help prevent nausea and vomiting after your treatment, we encourage you to take your nausea medication as directed. ? ?BELOW ARE SYMPTOMS THAT SHOULD BE REPORTED IMMEDIATELY: ?*FEVER GREATER THAN 100.4 F (38 ?C) OR HIGHER ?*CHILLS OR SWEATING ?*NAUSEA AND VOMITING THAT IS NOT CONTROLLED WITH YOUR NAUSEA MEDICATION ?*UNUSUAL SHORTNESS OF BREATH ?*UNUSUAL BRUISING OR BLEEDING ?*URINARY PROBLEMS (pain or burning when urinating, or frequent urination) ?*BOWEL PROBLEMS (unusual diarrhea, constipation, pain near the anus) ?TENDERNESS IN MOUTH AND THROAT WITH OR WITHOUT PRESENCE OF ULCERS (sore throat, sores in mouth, or a toothache) ?UNUSUAL RASH, SWELLING OR PAIN  ?UNUSUAL VAGINAL DISCHARGE OR ITCHING  ? ?Items with * indicate a potential emergency and should be followed up as soon as possible or go to the Emergency Department if any problems should occur. ? ?Please show the CHEMOTHERAPY ALERT CARD or IMMUNOTHERAPY ALERT CARD at check-in to the Emergency Department and triage nurse. ? ?Should you have questions after your visit or need to cancel or reschedule your appointment, please contact [facility] [phone]  and follow the prompts.  Office hours are 8:00 a.m. to 4:30 p.m. Monday - Friday. Please note that voicemails left after 4:00 p.m. may not be returned until the following business day.  We  are closed weekends and major holidays. You have access to a nurse at all times for urgent questions. Please call the main number to the clinic [phone] and follow the prompts. ? ?For any non-urgent questions, you may also contact your provider using MyChart. We now offer e-Visits for anyone 85 and older to request care online for non-urgent symptoms. For details visit [email or web address]. ?  ?Also download the MyChart app! Go to the app store,  search "MyChart", open the app, select [facility], and log in with your MyChart username and password. ? ?Due to Covid, a mask is required upon entering the hospital/clinic. If you do not have a mask, one will be given to you upon arrival. For doctor visits, patients may have 1 support person aged 34 or older with them. For treatment visits, patients cannot have anyone with them due to current Covid guidelines and our immunocompromised population. [facility]  Discharge Instructions: ?Thank you for choosing [facility] to provide your oncology and hematology care.  ?If you have a lab appointment with the [facility], please come in thru the Main Entrance and check in at the main information desk. ? ? ?Should you have questions after your visit or need to cancel or reschedule your appointment, please contact [facility] [phone]  and follow the prompts.  Office hours are 8:00 a.m. to 4:30 p.m. Monday - Friday. Please note that voicemails left after 4:00 p.m. may not be returned until the following business day.  We are closed weekends and major holidays. You have access to a nurse at all times for urgent questions. Please call the main number to the clinic [phone] and follow the prompts. ? ?For any non-urgent questions, you may also contact your provider using MyChart. We now offer e-Visits for anyone 49 and older to request care online for non-urgent symptoms. For details visit [email or web address]. ?  ?Also download the MyChart app! Go to the app store, search "MyChart", open the app, select [facility], and log in with your MyChart username and password. ? ?Due to Covid, a mask is required upon entering the hospital/clinic. If you do not have a mask, one will be given to you upon arrival. For doctor visits, patients may have 1 support person aged 34 or older with them. For treatment visits, patients cannot have anyone with them due to current Covid guidelines  and our immunocompromised population.  ?

## 2021-11-16 NOTE — Progress Notes (Signed)
Patient has been examined by Dr. Delton Coombes, and vital signs and labs have been reviewed. ANC, Creatinine, LFTs, hemoglobin, and platelets are within treatment parameters per M.D. - pt may proceed with treatment.    ?

## 2021-11-16 NOTE — Progress Notes (Signed)
Pt presents today for Folfox per provider's order. Vital signs and labs WNL for treatment today. Mg is 1.5 today MD stated to give 2g IV of magnesium sulfate.Okay to proceed with treatment today per Dr.K. ? ?Folfox and 2g IV of magnesium sulfate given today per MD orders. Tolerated infusion without adverse affects. Vital signs stable. No complaints at this time. Discharged from clinic ambulatory in stable condition. Alert and oriented x 3. F/U with [facility] as scheduled.  5FU ambulatory pump infusing. ?

## 2021-11-16 NOTE — Progress Notes (Signed)
Louis House, [place] [address]   CLINIC:  Medical Oncology/Hematology  PCP:  Neale Burly, MD [place] / [place] [id number] [phone]   REASON FOR VISIT:  Follow-up for rectal cancer  PRIOR THERAPY: Capecitabine + XRT  NGS Results: not done  CURRENT THERAPY: FOLFOX q14d x 4 months (started 08/10/21  BRIEF ONCOLOGIC HISTORY:  Oncology History  Rectal cancer ([place])  05/01/2021 Initial Diagnosis   Rectal adenocarcinoma ([place])   05/30/2021 - 05/30/2021 Chemotherapy   Patient is on Treatment Plan : COLORECTAL Capecitabine + XRT     08/10/2021 -  Chemotherapy   Patient is on Treatment Plan : COLORECTAL FOLFOX q14d x 4 months       CANCER STAGING:  Cancer Staging  Rectal cancer ([place]) Staging form: Colon and Rectum, AJCC 8th Edition - Clinical stage from 05/18/2021: Stage IIIB (cT2, cN2a, cM0) - Unsigned   INTERVAL HISTORY:  Louis House, a 66 y.o. male, returns for routine follow-up and consideration for next cycle of chemotherapy. Louis House was last seen on 11/02/2021.  Due for cycle #7 of FOLFOX today.   Overall, he tells me he has been feeling pretty well. He reports occasional vertigo, and he reports a history of vertigo for 40 years. He reports cold sensitivity for 1 week following treatment. He denies n/v/d. He reports a mouth sore on his right corner of his mouth which has since resolved. He denies skin breakdown on his palms and soles.    Overall, he feels ready for next cycle of chemo today.    REVIEW OF SYSTEMS:  Review of Systems  Constitutional:  Negative for appetite change and fatigue.  HENT:   Negative for mouth sores (resolved).   Gastrointestinal:  Negative for diarrhea, nausea and vomiting.  Musculoskeletal:  Positive for back pain (5/10).  Neurological:  Positive for dizziness.  All other systems reviewed and are negative.  PAST MEDICAL/SURGICAL HISTORY:  Past Medical History:  Diagnosis Date    Anxiety    Diabetes ([place]) 01/12/2017   Diarrhea 02/21/2017   Essential hypertension, benign 01/12/2017   Gout    High cholesterol 01/12/2017   Neuropathy    Port-A-Cath in place 07/28/2021   Sleep apnea    Vertigo    Past Surgical History:  Procedure Laterality Date   BIOPSY  04/06/2017   Procedure: BIOPSY;  Surgeon: Rogene Houston, MD;  [facility];  Service: Endoscopy;;  colon   BIOPSY  04/12/2021   Procedure: BIOPSY;  Surgeon: Montez Morita, Quillian Quince, MD;  Location: [facility];  Service: Gastroenterology;;  nodular area in ascending colon biopsy random colon bx   bone spurs     CATARACT EXTRACTION W/PHACO Left 04/22/2018   Procedure: CATARACT EXTRACTION PHACO AND INTRAOCULAR LENS PLACEMENT LEFT EYE;  Surgeon: Tonny Branch, MD;  Location: [facility];  Service: Ophthalmology;  Laterality: Left;  CDE: 7.58   COLONOSCOPY WITH PROPOFOL N/A 04/06/2017   Procedure: COLONOSCOPY WITH PROPOFOL;  Surgeon: Rogene Houston, MD;  [facility]: [facility];  Service: Endoscopy;  Laterality: N/A;  1:45   COLONOSCOPY WITH PROPOFOL N/A 04/12/2021   Procedure: COLONOSCOPY WITH PROPOFOL;  Surgeon: Harvel Quale, MD;  [facility];  Service: Gastroenterology;  Laterality: N/A;  9:05   FLEXIBLE SIGMOIDOSCOPY N/A 05/10/2021   Procedure: FLEXIBLE SIGMOIDOSCOPY;  Surgeon: Harvel Quale, MD;  [facility];  Service: Gastroenterology;  Laterality: N/A;  9:50 ASA 1  Per Dr C no Anesthesia, no PAT - ok per Cecilio Asper arthroscopy     both knees   Left shoulder surgery from injury     PORTACATH PLACEMENT Left 08/08/2021   Procedure: INSERTION PORT-A-CATH;  Surgeon: Aviva Signs, MD;  Location: [facility];  Service: General;  Laterality: Left;   Umblical hernia      SOCIAL HISTORY:  Social History   Socioeconomic History   Marital status: Divorced    Spouse name: Not on file   Number of children: Not on file   Years of education: Not on file   Highest  education level: Not on file  Occupational History   Not on file  Tobacco Use   Smoking status: Never   Smokeless tobacco: Never  Vaping Use   Vaping Use: Never used  Substance and Sexual Activity   Alcohol use: No   Drug use: No   Sexual activity: Yes    Birth control/protection: None  Other Topics Concern   Not on file  Social History Narrative   Not on file   Social Determinants of Health   Financial Resource Strain: Low Risk    Difficulty of Paying Living Expenses: Not very hard  Food Insecurity: No Food Insecurity   Worried About Running Out of Food in the Last Year: Never true   Ran Out of Food in the Last Year: Never true  Transportation Needs: No Transportation Needs   Lack of Transportation (Medical): No   Lack of Transportation (Non-Medical): No  Physical Activity: Insufficiently Active   Days of Exercise per Week: 2 days   Minutes of Exercise per Session: 20 min  Stress: No Stress Concern Present   Feeling of Stress : Not at all  Social Connections: Socially Isolated   Frequency of Communication with Friends and Family: More than three times a week   Frequency of Social Gatherings with Friends and Family: More than three times a week   Attends Religious Services: Never   Marine scientist or Organizations: No   Attends Music therapist: Never   Marital Status: Divorced  Human resources officer Violence: Not At Risk   Fear of Current or Ex-Partner: No   Emotionally Abused: No   Physically Abused: No   Sexually Abused: No    FAMILY HISTORY:  No family history on file.  CURRENT MEDICATIONS:  Current Outpatient Medications  Medication Sig Dispense Refill   alfuzosin (UROXATRAL) 10 MG 24 hr tablet Take 10 mg by mouth at bedtime. 2200     allopurinol (ZYLOPRIM) 300 MG tablet Take 300 mg by mouth daily.     atorvastatin (LIPITOR) 40 MG tablet Take 40 mg by mouth daily.     benazepril (LOTENSIN) 40 MG tablet Take 40 mg by mouth daily.  0    busPIRone (BUSPAR) 15 MG tablet Take 15 mg by mouth 2 (two) times daily.     chlorthalidone (HYGROTON) 50 MG tablet Take 50 mg by mouth daily.     colestipol (COLESTID) 1 g tablet Take 4 tablets (4 g total) by mouth daily. Take 4 hours apart form your other medications 360 tablet 3   Cyanocobalamin (VITAMIN B-12) 5000 MCG SUBL Place 15,000 mcg under the tongue daily.     diazepam (VALIUM) 10 MG tablet Take 10 mg by mouth at bedtime.      diclofenac (VOLTAREN) 75 MG EC tablet Take 75 mg by mouth 2 (two) times daily.     diphenoxylate-atropine (LOMOTIL) 2.5-0.025  MG tablet Take 1 tablet by mouth 4 (four) times daily as needed for diarrhea or loose stools. 60 tablet 2   DULoxetine (CYMBALTA) 20 MG capsule Take 20 mg by mouth daily.     DULoxetine (CYMBALTA) 30 MG capsule Take 30 mg by mouth daily as needed.     fluorouracil CALGB 62563 2,400 mg/m2 in sodium chloride 0.9 % 150 mL Inject 2,400 mg/m2 into the vein over 48 hr.     FLUOROURACIL IV Inject 400 mg/m2 into the vein every 14 (fourteen) days.     fluticasone (FLONASE) 50 MCG/ACT nasal spray Place 3-4 sprays into both nostrils daily.     folic acid (FOLVITE) 1 MG tablet folic acid 1 mg tablet  TAKE 1 TABLET BY MOUTH DAILY     gabapentin (NEURONTIN) 400 MG capsule Take 800 mg by mouth 2 (two) times daily. 400-800 mg as needed for pain     HYDROcodone-acetaminophen (NORCO) 10-325 MG tablet Take 1 tablet by mouth 3 (three) times daily.     hydrOXYzine (ATARAX) 25 MG tablet      LEUCOVORIN CALCIUM IV Inject 400 mg/m2 into the vein every 14 (fourteen) days.     levofloxacin (LEVAQUIN) 500 MG tablet Take 1 tablet (500 mg total) by mouth daily. 7 tablet 0   lidocaine-prilocaine (EMLA) cream Apply a small amount to port a cath site (do not rub in) and cover with plastic wrap 1 hour prior to chemotherapy appointments 30 g 3   loperamide (IMODIUM) 2 MG capsule Take 2 mg by mouth as needed. Use on schedule per Dr. Laural Golden.     loratadine (CLARITIN) 10 MG  tablet Take 10 mg by mouth daily as needed for allergies (Seasonal).     meclizine (ANTIVERT) 25 MG tablet Take 25 mg by mouth 3 (three) times daily as needed for dizziness (for flare up of inner ear issues.).     Melatonin 10 MG CAPS Take 30 mg by mouth at bedtime.     metoprolol succinate (TOPROL-XL) 100 MG 24 hr tablet Take 100 mg by mouth daily.     naloxone (NARCAN) nasal spray 4 mg/0.1 mL Narcan 4 mg/actuation nasal spray  Take 1 spray as needed by nasal route as directed.     Naphazoline-Pheniramine (OPCON-A) 0.027-0.315 % SOLN Place 1 drop into both eyes daily as needed (irritation).     Omega-3 Fatty Acids (OMEGA-3 FISH OIL PO) Take 2,160 mg by mouth daily. 720 mg per cap     OXALIPLATIN IV Inject 85 mg/m2 into the vein every 14 (fourteen) days.     oxymetazoline (AFRIN) 0.05 % nasal spray Place 3 sprays into both nostrils 2 (two) times daily.     potassium chloride SA (KLOR-CON M) 20 MEQ tablet Take 1 tablet (20 mEq total) by mouth daily. 30 tablet 3   prochlorperazine (COMPAZINE) 10 MG tablet Take 1 tablet (10 mg total) by mouth in the morning and at bedtime. Take 58mn before chemotherapy 60 tablet 3   sulfaSALAzine (AZULFIDINE) 500 MG tablet sulfasalazine 500 mg tablet  TAKE 2 TABLETS BY MOUTH TWICE DAILY     tizanidine (ZANAFLEX) 2 MG capsule Take 2 mg by mouth 3 (three) times daily as needed for muscle spasms.     traZODone (DESYREL) 50 MG tablet Take 50 mg by mouth at bedtime.     Current Facility-Administered Medications  Medication Dose Route Frequency Provider Last Rate Last Admin   dicyclomine (BENTYL) tablet 20 mg  20 mg Oral TID AC &  HS Setzer, Rona Ravens, NP       Facility-Administered Medications Ordered in Other Visits  Medication Dose Route Frequency Provider Last Rate Last Admin   0.9 %  sodium chloride infusion   Intravenous Continuous Derek Jack, MD   Stopped at 10/05/21 1232   heparin lock flush 100 unit/mL  500 Units Intracatheter Once PRN Derek Jack, MD       sodium chloride flush (NS) 0.9 % injection 10 mL  10 mL Intracatheter PRN Derek Jack, MD   10 mL at 09/09/21 1348   sodium chloride flush (NS) 0.9 % injection 10 mL  10 mL Intracatheter PRN Derek Jack, MD        ALLERGIES:  No Known Allergies  PHYSICAL EXAM:  Performance status (ECOG): 0 - Asymptomatic  Vitals:   11/16/21 0848  BP: 104/79  Pulse: 84  Resp: 20  Temp: (!) 97.4 F (36.3 C)  SpO2: 100%   Wt Readings from Last 3 Encounters:  11/16/21 275 lb 12.8 oz (125.1 kg)  11/02/21 270 lb 3.2 oz (122.6 kg)  10/19/21 281 lb 12.8 oz (127.8 kg)   Physical Exam Vitals reviewed.  Constitutional:      Appearance: Normal appearance. He is obese.  Cardiovascular:     Rate and Rhythm: Normal rate and regular rhythm.     Pulses: Normal pulses.     Heart sounds: Normal heart sounds.  Pulmonary:     Effort: Pulmonary effort is normal.     Breath sounds: Normal breath sounds.  Neurological:     General: No focal deficit present.     Mental Status: He is alert and oriented to person, place, and time.  Psychiatric:        Mood and Affect: Mood normal.        Behavior: Behavior normal.    LABORATORY DATA:  I have reviewed the labs as listed.  CBC Latest Ref Rng & Units 11/02/2021 10/19/2021 10/05/2021  WBC 4.0 - 10.5 K/uL 3.6(L) 4.1 6.0  Hemoglobin 13.0 - 17.0 g/dL 10.7(L) 10.0(L) 10.4(L)  Hematocrit 39.0 - 52.0 % 32.7(L) 29.6(L) 30.8(L)  Platelets 150 - 400 K/uL 135(L) 146(L) 233   CMP Latest Ref Rng & Units 11/02/2021 10/19/2021 10/05/2021  Glucose 70 - 99 mg/dL 222(H) 125(H) 186(H)  BUN 8 - 23 mg/dL 28(H) 25(H) 32(H)  Creatinine 0.61 - 1.24 mg/dL 1.23 1.32(H) 1.63(H)  Sodium 135 - 145 mmol/L 135 136 133(L)  Potassium 3.5 - 5.1 mmol/L 3.6 3.9 3.1(L)  Chloride 98 - 111 mmol/L 98 99 93(L)  CO2 22 - 32 mmol/L 29 30 30   Calcium 8.9 - 10.3 mg/dL 9.4 9.2 9.9  Total Protein 6.5 - 8.1 g/dL 6.5 6.0(L) 6.4(L)  Total Bilirubin 0.3 - 1.2 mg/dL 0.7 0.9  0.4  Alkaline Phos 38 - 126 U/L 74 63 57  AST 15 - 41 U/L 34 21 19  ALT 0 - 44 U/L 38 20 17    DIAGNOSTIC IMAGING:  I have independently reviewed the scans and discussed with the patient. No results found.   ASSESSMENT:  Stage III (T2N2) rectal cancer, MSI stable: - He reported diarrhea which started in May.  Prior colonoscopy was in 2018. - Colonoscopy on 04/12/2021 showed fungating polypoid nonobstructing mass with central depression found at 8 cm from the anal verge.  Mass was noncircumferential, measured 2 cm in length.  Nodular mucosa in the proximal ascending colon which was biopsied. - Pathology of the rectal mass consistent with adenocarcinoma.  Nodular  area in the ascending colon was consistent with collagenous colitis.  MMR preserved.  MSI stable. - CT CAP on 05/13/2021 with no evidence of metastatic disease in the chest, abdomen or pelvis.  Mild hepatic steatosis. - Pelvic MRI on 05/12/2021-T2N2 by MRI staging.  4 small lymph nodes 5 mm or greater. - Total neoadjuvant therapy was recommended on discussion with Dr. Marcello Moores. - Xeloda and XRT from 06/08/2021 through 07/15/2021. - FOLFOX started on 08/10/2021.  2.  Social/family history: - He lives at home by himself.  Daughter lives in [place]. - He worked at [employer] in [address] prior to retirement.  Non-smoker. - Sister had lung cancer and was a smoker.  No family history of colon cancer.   PLAN:  Stage III (T2N2) rectal adenocarcinoma, MSI stable: - He denied any major side effects after treatment.  He had cold sensitivity lasting 7 to 8 days.  No tingling or numbness extremities reported. - He had episode of vertigo yesterday.  Meclizine helps. - Reviewed labs from today which showed normal LFTs except elevated ALT of 57. - CBC was grossly normal with platelet count 132 and normal white count. - Proceed with cycle 7 today without any dose modifications.  RTC 2 weeks for follow-up for cycle 8 followed by scans.   2.   Normocytic anemia: - Combination anemia from CKD and iron deficiency.  Hemoglobin is 10.4.  Previous ferritin was 58 and percent saturation was 17. Lab if hemoglobin-if hemoglobin drops below 10, will consider parenteral iron therapy.  3.  Diarrhea: - Continue colestipol 4 g daily.  Continue Lomotil as needed for diarrhea.  4.  Chronic back pain: - Continue hydrocodone 10/325 mg daily as needed.  5.  Hypokalemia: - Continue potassium 20 mEq daily.  Potassium normal.   Orders placed this encounter:  No orders of the defined types were placed in this encounter.    Derek Jack, MD [place] [phone]   I, Thana Ates, am acting as a scribe for Dr. Derek Jack.  I, Derek Jack MD, have reviewed the above documentation for accuracy and completeness, and I agree with the above.

## 2021-11-16 NOTE — Patient Instructions (Signed)
[place] at [facility] ?Discharge Instructions ? ? ?You were seen and examined today by Dr. Delton Coombes. ? ?He reviewed your lab work today which is normal/stable. ? ?We will proceed with cycle 7 of treatment today. ? ?Return as scheduled in 2 weeks.  ? ? ?Thank you for choosing [place] at [facility] to provide your oncology and hematology care.  To afford each patient quality time with our provider, please arrive at least 15 minutes before your scheduled appointment time.  ? ?If you have a lab appointment with the [facility] please come in thru the Main Entrance and check in at the main information desk. ? ?You need to re-schedule your appointment should you arrive 10 or more minutes late.  We strive to give you quality time with our providers, and arriving late affects you and other patients whose appointments are after yours.  Also, if you no show three or more times for appointments you may be dismissed from the clinic at the providers discretion.     ?Again, thank you for choosing [facility]  Our hope is that these requests will decrease the amount of time that you wait before being seen by our physicians.       ?_____________________________________________________________ ? ?Should you have questions after your visit to [facility], please contact our office at [phone] and follow the prompts.  Our office hours are 8:00 a.m. and 4:30 p.m. Monday - Friday.  Please note that voicemails left after 4:00 p.m. may not be returned until the following business day.  We are closed weekends and major holidays.  You do have access to a nurse 24-7, just call the main number to the clinic [phone] and do not press any options, hold on the line and a nurse will answer the phone.   ? ?For prescription refill requests, have your pharmacy contact our office and allow 72 hours.   ? ?Due to Covid, you will need to wear a mask upon  entering the hospital. If you do not have a mask, a mask will be given to you at the Main Entrance upon arrival. For doctor visits, patients may have 1 support person age 39 or older with them. For treatment visits, patients can not have anyone with them due to social distancing guidelines and our immunocompromised population.  ? ?   ?

## 2021-11-17 LAB — CEA: CEA: 4.6 ng/mL (ref 0.0–4.7)

## 2021-11-18 ENCOUNTER — Other Ambulatory Visit: Payer: Self-pay

## 2021-11-18 ENCOUNTER — Encounter (HOSPITAL_COMMUNITY): Payer: Self-pay

## 2021-11-18 ENCOUNTER — Inpatient Hospital Stay (HOSPITAL_COMMUNITY): Payer: Medicare Other

## 2021-11-18 VITALS — BP 132/89 | HR 79 | Temp 97.0°F | Resp 18

## 2021-11-18 DIAGNOSIS — I129 Hypertensive chronic kidney disease with stage 1 through stage 4 chronic kidney disease, or unspecified chronic kidney disease: Secondary | ICD-10-CM | POA: Diagnosis not present

## 2021-11-18 DIAGNOSIS — Z95828 Presence of other vascular implants and grafts: Secondary | ICD-10-CM

## 2021-11-18 DIAGNOSIS — C20 Malignant neoplasm of rectum: Secondary | ICD-10-CM | POA: Diagnosis not present

## 2021-11-18 DIAGNOSIS — D509 Iron deficiency anemia, unspecified: Secondary | ICD-10-CM | POA: Diagnosis not present

## 2021-11-18 DIAGNOSIS — D631 Anemia in chronic kidney disease: Secondary | ICD-10-CM | POA: Diagnosis not present

## 2021-11-18 DIAGNOSIS — Z5111 Encounter for antineoplastic chemotherapy: Secondary | ICD-10-CM | POA: Diagnosis not present

## 2021-11-18 DIAGNOSIS — N189 Chronic kidney disease, unspecified: Secondary | ICD-10-CM | POA: Diagnosis not present

## 2021-11-18 MED ORDER — HEPARIN SOD (PORK) LOCK FLUSH 100 UNIT/ML IV SOLN
500.0000 [IU] | Freq: Once | INTRAVENOUS | Status: AC | PRN
  Administered 2021-11-18: 500 [IU]

## 2021-11-18 MED ORDER — SODIUM CHLORIDE 0.9% FLUSH
10.0000 mL | INTRAVENOUS | Status: DC | PRN
  Administered 2021-11-18: 10 mL

## 2021-11-18 NOTE — Progress Notes (Signed)
Patient reports today for pump d/c. Port flushed with good blood return noted. No bruising or swelling at site. Bandaid applied and patient discharged in satisfactory condition. VVS stable with no signs or symptoms of distressed noted.  ?

## 2021-11-18 NOTE — Patient Instructions (Signed)
[place] [place]  Discharge Instructions: ?Thank you for choosing [facility] to provide your oncology and hematology care.  ?If you have a lab appointment with the [facility], please come in thru the Main Entrance and check in at the main information desk. ? ?Wear comfortable clothing and clothing appropriate for easy access to any Portacath or PICC line.  ? ?We strive to give you quality time with your provider. You may need to reschedule your appointment if you arrive late (15 or more minutes).  Arriving late affects you and other patients whose appointments are after yours.  Also, if you miss three or more appointments without notifying the office, you may be dismissed from the clinic at the provider?s discretion.    ?  ?For prescription refill requests, have your pharmacy contact our office and allow 72 hours for refills to be completed.   ? ?Today your pump was disconnected, return as scheduled. ?  ?To help prevent nausea and vomiting after your treatment, we encourage you to take your nausea medication as directed. ? ?BELOW ARE SYMPTOMS THAT SHOULD BE REPORTED IMMEDIATELY: ?*FEVER GREATER THAN 100.4 F (38 ?C) OR HIGHER ?*CHILLS OR SWEATING ?*NAUSEA AND VOMITING THAT IS NOT CONTROLLED WITH YOUR NAUSEA MEDICATION ?*UNUSUAL SHORTNESS OF BREATH ?*UNUSUAL BRUISING OR BLEEDING ?*URINARY PROBLEMS (pain or burning when urinating, or frequent urination) ?*BOWEL PROBLEMS (unusual diarrhea, constipation, pain near the anus) ?TENDERNESS IN MOUTH AND THROAT WITH OR WITHOUT PRESENCE OF ULCERS (sore throat, sores in mouth, or a toothache) ?UNUSUAL RASH, SWELLING OR PAIN  ?UNUSUAL VAGINAL DISCHARGE OR ITCHING  ? ?Items with * indicate a potential emergency and should be followed up as soon as possible or go to the Emergency Department if any problems should occur. ? ?Please show the CHEMOTHERAPY ALERT CARD or IMMUNOTHERAPY ALERT CARD at check-in to the Emergency Department and triage nurse. ? ?Should  you have questions after your visit or need to cancel or reschedule your appointment, please contact [facility] [phone]  and follow the prompts.  Office hours are 8:00 a.m. to 4:30 p.m. Monday - Friday. Please note that voicemails left after 4:00 p.m. may not be returned until the following business day.  We are closed weekends and major holidays. You have access to a nurse at all times for urgent questions. Please call the main number to the clinic [phone] and follow the prompts. ? ?For any non-urgent questions, you may also contact your provider using MyChart. We now offer e-Visits for anyone 11 and older to request care online for non-urgent symptoms. For details visit [email or web address]. ?  ?Also download the MyChart app! Go to the app store, search "MyChart", open the app, select [facility], and log in with your MyChart username and password. ? ?Due to Covid, a mask is required upon entering the hospital/clinic. If you do not have a mask, one will be given to you upon arrival. For doctor visits, patients may have 1 support person aged 54 or older with them. For treatment visits, patients cannot have anyone with them due to current Covid guidelines and our immunocompromised population.  ?

## 2021-11-21 DIAGNOSIS — E11610 Type 2 diabetes mellitus with diabetic neuropathic arthropathy: Secondary | ICD-10-CM | POA: Diagnosis not present

## 2021-11-21 DIAGNOSIS — N182 Chronic kidney disease, stage 2 (mild): Secondary | ICD-10-CM | POA: Diagnosis not present

## 2021-11-21 DIAGNOSIS — I10 Essential (primary) hypertension: Secondary | ICD-10-CM | POA: Diagnosis not present

## 2021-11-21 DIAGNOSIS — M109 Gout, unspecified: Secondary | ICD-10-CM | POA: Diagnosis not present

## 2021-11-21 DIAGNOSIS — H8113 Benign paroxysmal vertigo, bilateral: Secondary | ICD-10-CM | POA: Diagnosis not present

## 2021-11-30 ENCOUNTER — Inpatient Hospital Stay (HOSPITAL_COMMUNITY): Payer: Medicare Other

## 2021-11-30 ENCOUNTER — Inpatient Hospital Stay (HOSPITAL_BASED_OUTPATIENT_CLINIC_OR_DEPARTMENT_OTHER): Payer: Medicare Other | Admitting: Hematology

## 2021-11-30 ENCOUNTER — Other Ambulatory Visit: Payer: Self-pay

## 2021-11-30 VITALS — BP 134/89 | HR 72 | Temp 97.1°F | Resp 18

## 2021-11-30 DIAGNOSIS — D631 Anemia in chronic kidney disease: Secondary | ICD-10-CM | POA: Diagnosis not present

## 2021-11-30 DIAGNOSIS — N189 Chronic kidney disease, unspecified: Secondary | ICD-10-CM | POA: Diagnosis not present

## 2021-11-30 DIAGNOSIS — D509 Iron deficiency anemia, unspecified: Secondary | ICD-10-CM | POA: Diagnosis not present

## 2021-11-30 DIAGNOSIS — E876 Hypokalemia: Secondary | ICD-10-CM

## 2021-11-30 DIAGNOSIS — C20 Malignant neoplasm of rectum: Secondary | ICD-10-CM

## 2021-11-30 DIAGNOSIS — Z95828 Presence of other vascular implants and grafts: Secondary | ICD-10-CM

## 2021-11-30 DIAGNOSIS — R7989 Other specified abnormal findings of blood chemistry: Secondary | ICD-10-CM

## 2021-11-30 DIAGNOSIS — I129 Hypertensive chronic kidney disease with stage 1 through stage 4 chronic kidney disease, or unspecified chronic kidney disease: Secondary | ICD-10-CM | POA: Diagnosis not present

## 2021-11-30 DIAGNOSIS — R110 Nausea: Secondary | ICD-10-CM

## 2021-11-30 DIAGNOSIS — Z5111 Encounter for antineoplastic chemotherapy: Secondary | ICD-10-CM | POA: Diagnosis not present

## 2021-11-30 LAB — COMPREHENSIVE METABOLIC PANEL
ALT: 38 U/L (ref 0–44)
AST: 37 U/L (ref 15–41)
Albumin: 3.9 g/dL (ref 3.5–5.0)
Alkaline Phosphatase: 84 U/L (ref 38–126)
Anion gap: 11 (ref 5–15)
BUN: 29 mg/dL — ABNORMAL HIGH (ref 8–23)
CO2: 29 mmol/L (ref 22–32)
Calcium: 9.6 mg/dL (ref 8.9–10.3)
Chloride: 93 mmol/L — ABNORMAL LOW (ref 98–111)
Creatinine, Ser: 1.44 mg/dL — ABNORMAL HIGH (ref 0.61–1.24)
GFR, Estimated: 54 mL/min — ABNORMAL LOW (ref >60)
Glucose, Bld: 286 mg/dL — ABNORMAL HIGH (ref 70–99)
Potassium: 3.2 mmol/L — ABNORMAL LOW (ref 3.5–5.1)
Sodium: 133 mmol/L — ABNORMAL LOW (ref 135–145)
Total Bilirubin: 0.6 mg/dL (ref 0.3–1.2)
Total Protein: 6.7 g/dL (ref 6.5–8.1)

## 2021-11-30 LAB — CBC WITH DIFFERENTIAL/PLATELET
Abs Immature Granulocytes: 0.03 10*3/uL (ref 0.00–0.07)
Basophils Absolute: 0.1 10*3/uL (ref 0.0–0.1)
Basophils Relative: 1 %
Eosinophils Absolute: 0.2 10*3/uL (ref 0.0–0.5)
Eosinophils Relative: 4 %
HCT: 35.2 % — ABNORMAL LOW (ref 39.0–52.0)
Hemoglobin: 12 g/dL — ABNORMAL LOW (ref 13.0–17.0)
Immature Granulocytes: 1 %
Lymphocytes Relative: 12 %
Lymphs Abs: 0.6 10*3/uL — ABNORMAL LOW (ref 0.7–4.0)
MCH: 32.8 pg (ref 26.0–34.0)
MCHC: 34.1 g/dL (ref 30.0–36.0)
MCV: 96.2 fL (ref 80.0–100.0)
Monocytes Absolute: 0.7 10*3/uL (ref 0.1–1.0)
Monocytes Relative: 12 %
Neutro Abs: 3.9 10*3/uL (ref 1.7–7.7)
Neutrophils Relative %: 70 %
Platelets: 160 10*3/uL (ref 150–400)
RBC: 3.66 MIL/uL — ABNORMAL LOW (ref 4.22–5.81)
RDW: 16.4 % — ABNORMAL HIGH (ref 11.5–15.5)
WBC: 5.5 10*3/uL (ref 4.0–10.5)
nRBC: 0 % (ref 0.0–0.2)

## 2021-11-30 LAB — MAGNESIUM: Magnesium: 1.8 mg/dL (ref 1.7–2.4)

## 2021-11-30 MED ORDER — SODIUM CHLORIDE 0.9 % IV SOLN
10.0000 mg | Freq: Once | INTRAVENOUS | Status: AC
  Administered 2021-11-30: 10 mg via INTRAVENOUS
  Filled 2021-11-30: qty 10

## 2021-11-30 MED ORDER — PROCHLORPERAZINE MALEATE 10 MG PO TABS
10.0000 mg | ORAL_TABLET | Freq: Once | ORAL | Status: AC
  Administered 2021-11-30: 10 mg via ORAL
  Filled 2021-11-30: qty 1

## 2021-11-30 MED ORDER — PALONOSETRON HCL INJECTION 0.25 MG/5ML
0.2500 mg | Freq: Once | INTRAVENOUS | Status: AC
  Administered 2021-11-30: 0.25 mg via INTRAVENOUS
  Filled 2021-11-30: qty 5

## 2021-11-30 MED ORDER — POTASSIUM CHLORIDE CRYS ER 20 MEQ PO TBCR
40.0000 meq | EXTENDED_RELEASE_TABLET | Freq: Once | ORAL | Status: AC
  Administered 2021-11-30: 40 meq via ORAL
  Filled 2021-11-30: qty 2

## 2021-11-30 MED ORDER — DEXTROSE 5 % IV SOLN: Freq: Once | INTRAVENOUS | Status: AC

## 2021-11-30 MED ORDER — SODIUM CHLORIDE 0.9 % IV SOLN: Freq: Once | INTRAVENOUS | Status: AC

## 2021-11-30 MED ORDER — SODIUM CHLORIDE 0.9 % IV SOLN
1940.0000 mg/m2 | INTRAVENOUS | Status: DC
  Administered 2021-11-30: 5000 mg via INTRAVENOUS
  Filled 2021-11-30: qty 100

## 2021-11-30 MED ORDER — OXALIPLATIN CHEMO INJECTION 100 MG/20ML
68.0000 mg/m2 | Freq: Once | INTRAVENOUS | Status: AC
  Administered 2021-11-30: 175 mg via INTRAVENOUS
  Filled 2021-11-30: qty 35

## 2021-11-30 MED ORDER — FLUOROURACIL CHEMO INJECTION 2.5 GM/50ML
320.0000 mg/m2 | Freq: Once | INTRAVENOUS | Status: AC
  Administered 2021-11-30: 800 mg via INTRAVENOUS
  Filled 2021-11-30: qty 16

## 2021-11-30 MED ORDER — LEUCOVORIN CALCIUM INJECTION 350 MG
320.0000 mg/m2 | Freq: Once | INTRAVENOUS | Status: AC
  Administered 2021-11-30: 822 mg via INTRAVENOUS
  Filled 2021-11-30: qty 41.1

## 2021-11-30 NOTE — Progress Notes (Signed)
Patient has been examined by Dr. Delton Coombes, and vital signs and labs have been reviewed. ANC, Creatinine, LFTs, hemoglobin, and platelets are within treatment parameters per M.D. - pt may proceed with treatment.    ?

## 2021-11-30 NOTE — Patient Instructions (Signed)
[facility]  Discharge Instructions: Thank you for choosing [facility] to provide your oncology and hematology care.  If you have a lab appointment with the Cancer Center, please come in thru the Main Entrance and check in at the main information desk.  Wear comfortable clothing and clothing appropriate for easy access to any Portacath or PICC line.   We strive to give you quality time with your provider. You may need to reschedule your appointment if you arrive late (15 or more minutes).  Arriving late affects you and other patients whose appointments are after yours.  Also, if you miss three or more appointments without notifying the office, you may be dismissed from the clinic at the provider's discretion.      For prescription refill requests, have your pharmacy contact our office and allow 72 hours for refills to be completed.        To help prevent nausea and vomiting after your treatment, we encourage you to take your nausea medication as directed.  BELOW ARE SYMPTOMS THAT SHOULD BE REPORTED IMMEDIATELY: *FEVER GREATER THAN 100.4 F (38 C) OR HIGHER *CHILLS OR SWEATING *NAUSEA AND VOMITING THAT IS NOT CONTROLLED WITH YOUR NAUSEA MEDICATION *UNUSUAL SHORTNESS OF BREATH *UNUSUAL BRUISING OR BLEEDING *URINARY PROBLEMS (pain or burning when urinating, or frequent urination) *BOWEL PROBLEMS (unusual diarrhea, constipation, pain near the anus) TENDERNESS IN MOUTH AND THROAT WITH OR WITHOUT PRESENCE OF ULCERS (sore throat, sores in mouth, or a toothache) UNUSUAL RASH, SWELLING OR PAIN  UNUSUAL VAGINAL DISCHARGE OR ITCHING   Items with * indicate a potential emergency and should be followed up as soon as possible or go to the Emergency Department if any problems should occur.  Please show the CHEMOTHERAPY ALERT CARD or IMMUNOTHERAPY ALERT CARD at check-in to the Emergency Department and triage nurse.  Should you have questions after your visit or need to cancel  or reschedule your appointment, please contact [facility] [phone]  and follow the prompts.  Office hours are 8:00 a.m. to 4:30 p.m. Monday - Friday. Please note that voicemails left after 4:00 p.m. may not be returned until the following business day.  We are closed weekends and major holidays. You have access to a nurse at all times for urgent questions. Please call the main number to the clinic [phone] and follow the prompts.  For any non-urgent questions, you may also contact your provider using MyChart. We now offer e-Visits for anyone 18 and older to request care online for non-urgent symptoms. For details visit [email or web address].   Also download the MyChart app! Go to the app store, search "MyChart", open the app, select [facility], and log in with your MyChart username and password.  Due to Covid, a mask is required upon entering the hospital/clinic. If you do not have a mask, one will be given to you upon arrival. For doctor visits, patients may have 1 support person aged 18 or older with them. For treatment visits, patients cannot have anyone with them due to current Covid guidelines and our immunocompromised population.  

## 2021-11-30 NOTE — Patient Instructions (Signed)
[place] at [facility] ?Discharge Instructions ? ? ?You were seen and examined today by Dr. Delton Coombes. ? ?He reviewed your lab results which are mostly normal/stable. Your potassium was low today.  We will give you potassium here to take, and you should resume taking your potassium at home. ? ?We will proceed with your final treatment today.  ? ?Return as scheduled.  ? ? ?Thank you for choosing [place] at [facility] to provide your oncology and hematology care.  To afford each patient quality time with our provider, please arrive at least 15 minutes before your scheduled appointment time.  ? ?If you have a lab appointment with the [place] please come in thru the Main Entrance and check in at the main information desk. ? ?You need to re-schedule your appointment should you arrive 10 or more minutes late.  We strive to give you quality time with our providers, and arriving late affects you and other patients whose appointments are after yours.  Also, if you no show three or more times for appointments you may be dismissed from the clinic at the providers discretion.     ?Again, thank you for choosing [facility].  Our hope is that these requests will decrease the amount of time that you wait before being seen by our physicians.       ?_____________________________________________________________ ? ?Should you have questions after your visit to [facility], please contact our office at [phone] and follow the prompts.  Our office hours are 8:00 a.m. and 4:30 p.m. Monday - Friday.  Please note that voicemails left after 4:00 p.m. may not be returned until the following business day.  We are closed weekends and major holidays.  You do have access to a nurse 24-7, just call the main number to the clinic [phone] and do not press any options, hold on the line and a nurse will answer the phone.   ? ?For prescription refill  requests, have your pharmacy contact our office and allow 72 hours.   ? ?Due to Covid, you will need to wear a mask upon entering the hospital. If you do not have a mask, a mask will be given to you at the Main Entrance upon arrival. For doctor visits, patients may have 1 support person age 29 or older with them. For treatment visits, patients can not have anyone with them due to social distancing guidelines and our immunocompromised population.  ? ?   ?

## 2021-11-30 NOTE — Progress Notes (Signed)
Patients port flushed without difficulty.  Good blood return noted with no bruising or swelling noted at site.  Stable during access and blood draw.  Patient to remain accessed for treatment. 

## 2021-11-30 NOTE — Progress Notes (Signed)
Patient presents today for chemotherapy infusion.  Patient is in satisfactory condition with only complaints of nausea.  Vital signs are stable.  Labs reviewed by Dr. Delton Coombes during his office visit.  Potassium today is 3.2.  Creatinine is 1.44.  Patient will receive 500 mL of NS over one hour, Klor Con 40 mEq PO x one dose, and Compazine 10 mg PO x one dose today per Dr. Delton Coombes.  We will proceed with treatment per MD orders.  ? ?Patient tolerated treatment well with no complaints voiced.  Patient connected to home infusion 5FU pump with no issues.  Patient left  via wheelchair in stable condition.  Vital signs stable at discharge.  Follow up as scheduled.    ?

## 2021-11-30 NOTE — Progress Notes (Signed)
? ?[place] ?[address] ?[place], [place] [address] ? ? ?CLINIC:  ?Medical Oncology/Hematology ? ?PCP:  ?Neale Burly, MD ?[place] / [place] [address] ?[phone] ? ? ?REASON FOR VISIT:  ?Follow-up for rectal cancer ? ?PRIOR THERAPY: Capecitabine + XRT ? ?NGS Results: not done ? ?CURRENT THERAPY: FOLFOX q14d x 4 months (started 08/10/21 ? ?BRIEF ONCOLOGIC HISTORY:  ?Oncology History  ?Rectal cancer ([place])  ?05/01/2021 Initial Diagnosis  ? Rectal adenocarcinoma ([place]) ?  ?05/30/2021 - 05/30/2021 Chemotherapy  ? Patient is on Treatment Plan : COLORECTAL Capecitabine + XRT  ?   ?08/10/2021 -  Chemotherapy  ? Patient is on Treatment Plan : COLORECTAL FOLFOX q14d x 4 months  ?   ? ? ?CANCER STAGING: ? Cancer Staging  ?Rectal cancer ([place]) ?Staging form: Colon and Rectum, AJCC 8th Edition ?- Clinical stage from 05/18/2021: Stage IIIB (cT2, cN2a, cM0) - Unsigned ? ? ?INTERVAL HISTORY:  ?[name], a 66 y.o. male, returns for routine follow-up and consideration for next cycle of chemotherapy. Louis House was last seen on 11/16/2021. ? ?Due for cycle #8 of FOLFOX today.  ? ?Overall, he tells me he has been feeling pretty well. He reports nausea and fatigue this morning, and he denies n/v/d since his last treatment. He reports 3 days of constipation the week following his last treatment. He admits he forgot to take potassium for the past 1 week. He reports tingling in his finger with cold weather, and he denies tingling/numbness when not exposed to cold. His appetite is good, and he is eating well. ? ?Overall, he feels ready for next cycle of chemo today.  ? ? ?REVIEW OF SYSTEMS:  ?Review of Systems  ?Constitutional:  Positive for fatigue. Negative for appetite change.  ?Gastrointestinal:  Positive for constipation and nausea (this morning). Negative for diarrhea and vomiting.  ?Neurological:  Positive for dizziness and numbness.  ?All other systems reviewed and are negative. ? ?PAST  MEDICAL/SURGICAL HISTORY:  ?Past Medical History:  ?Diagnosis Date  ? Anxiety   ? Diabetes ([place]) 01/12/2017  ? Diarrhea 02/21/2017  ? Essential hypertension, benign 01/12/2017  ? Gout   ? High cholesterol 01/12/2017  ? Neuropathy   ? Port-A-Cath in place 07/28/2021  ? Sleep apnea   ? Vertigo   ? ?Past Surgical History:  ?Procedure Laterality Date  ? BIOPSY  04/06/2017  ? Procedure: BIOPSY;  Surgeon: Rogene Houston, MD;  [facility];  Service: Endoscopy;;  colon  ? BIOPSY  04/12/2021  ? Procedure: BIOPSY;  Surgeon: Montez Morita, Quillian Quince, MD;  [facility]: [facility];  Service: Gastroenterology;;  nodular area in ascending colon biopsy ?random colon bx  ? bone spurs    ? CATARACT EXTRACTION W/PHACO Left 04/22/2018  ? Procedure: CATARACT EXTRACTION PHACO AND INTRAOCULAR LENS PLACEMENT LEFT EYE;  Surgeon: Tonny Branch, MD;  Location: [facility] ORS;  Service: Ophthalmology;  Laterality: Left;  CDE: 7.58  ? COLONOSCOPY WITH PROPOFOL N/A 04/06/2017  ? Procedure: COLONOSCOPY WITH PROPOFOL;  Surgeon: Rogene Houston, MD;  Location: [facility];  Service: Endoscopy;  Laterality: N/A;  1:45  ? COLONOSCOPY WITH PROPOFOL N/A 04/12/2021  ? Procedure: COLONOSCOPY WITH PROPOFOL;  Surgeon: Harvel Quale, MD;  [facility]: [facility];  Service: Gastroenterology;  Laterality: N/A;  9:05  ? FLEXIBLE SIGMOIDOSCOPY N/A 05/10/2021  ? Procedure: FLEXIBLE SIGMOIDOSCOPY;  Surgeon: Montez Morita, Quillian Quince, MD;  Location: [facility];  Service: Gastroenterology;  Laterality: N/A;  9:50 ASA 1 Per Dr C no Anesthesia, no PAT - ok per Pamala Hurry  ? kneee arthroscopy    ? both knees  ? Left shoulder surgery from injury    ? PORTACATH PLACEMENT Left 08/08/2021  ? Procedure: INSERTION PORT-A-CATH;  Surgeon: Aviva Signs, MD;  Location: [facility] ORS;  Service: General;  Laterality: Left;  ? Umblical hernia    ? ? ?SOCIAL HISTORY:  ?Social History  ? ?Socioeconomic History  ? Marital status: Divorced  ?  Spouse name: Not on file  ? Number of  children: Not on file  ? Years of education: Not on file  ? Highest education level: Not on file  ?Occupational History  ? Not on file  ?Tobacco Use  ? Smoking status: Never  ? Smokeless tobacco: Never  ?Vaping Use  ? Vaping Use: Never used  ?Substance and Sexual Activity  ? Alcohol use: No  ? Drug use: No  ? Sexual activity: Yes  ?  Birth control/protection: None  ?Other Topics Concern  ? Not on file  ?Social History Narrative  ? Not on file  ? ?Social Determinants of Health  ? ?Financial Resource Strain: Low Risk   ? Difficulty of Paying Living Expenses: Not very hard  ?Food Insecurity: No Food Insecurity  ? Worried About Charity fundraiser in the Last Year: Never true  ? Ran Out of Food in the Last Year: Never true  ?Transportation Needs: No Transportation Needs  ? Lack of Transportation (Medical): No  ? Lack of Transportation (Non-Medical): No  ?Physical Activity: Insufficiently Active  ? Days of Exercise per Week: 2 days  ? Minutes of Exercise per Session: 20 min  ?Stress: No Stress Concern Present  ? Feeling of Stress : Not at all  ?Social Connections: Socially Isolated  ? Frequency of Communication with Friends and Family: More than three times a week  ? Frequency of Social Gatherings with Friends and Family: More than three times a week  ? Attends Religious Services: Never  ? Active Member of Clubs or Organizations: No  ? Attends Archivist Meetings: Never  ? Marital Status: Divorced  ?Intimate Partner Violence: Not At Risk  ? Fear of Current or Ex-Partner: No  ? Emotionally Abused: No  ? Physically Abused: No  ? Sexually Abused: No  ? ? ?FAMILY HISTORY:  ?No family history on file. ? ?CURRENT MEDICATIONS:  ?Current Outpatient Medications  ?Medication Sig Dispense Refill  ? alfuzosin (UROXATRAL) 10 MG 24 hr tablet Take 10 mg by mouth at bedtime. 2200    ? allopurinol (ZYLOPRIM) 300 MG tablet Take 300 mg by mouth daily.    ? atorvastatin (LIPITOR) 40 MG tablet Take 40 mg by mouth daily.    ?  benazepril (LOTENSIN) 40 MG tablet Take 40 mg by mouth daily.  0  ? busPIRone (BUSPAR) 15 MG tablet Take 15 mg by mouth 2 (two) times daily.    ? chlorthalidone (HYGROTON) 50 MG tablet Take 50 mg by mouth daily.    ? colestipol (COLESTID) 1 g tablet Take 4 tablets (4 g total) by mouth daily. Take 4 hours apart form your other medications 360 tablet 3  ? Cyanocobalamin (VITAMIN B-12) 5000 MCG SUBL Place 15,000 mcg under the tongue daily.    ? diazepam (VALIUM) 10 MG tablet Take 10 mg by mouth at bedtime.     ? diclofenac (VOLTAREN) 75 MG EC tablet Take 75 mg by mouth 2 (two) times daily.    ?  diphenoxylate-atropine (LOMOTIL) 2.5-0.025 MG tablet Take 1 tablet by mouth 4 (four) times daily as needed for diarrhea or loose stools. 60 tablet 2  ? DULoxetine (CYMBALTA) 30 MG capsule Take 30 mg by mouth daily as needed.    ? fluorouracil CALGB 94801 2,400 mg/m2 in sodium chloride 0.9 % 150 mL Inject 2,400 mg/m2 into the vein over 48 hr.    ? FLUOROURACIL IV Inject 400 mg/m2 into the vein every 14 (fourteen) days.    ? fluticasone (FLONASE) 50 MCG/ACT nasal spray Place 3-4 sprays into both nostrils daily.    ? folic acid (FOLVITE) 1 MG tablet folic acid 1 mg tablet ? TAKE 1 TABLET BY MOUTH DAILY    ? gabapentin (NEURONTIN) 400 MG capsule Take 800 mg by mouth 2 (two) times daily. 400-800 mg as needed for pain    ? HYDROcodone-acetaminophen (NORCO) 10-325 MG tablet Take 1 tablet by mouth 3 (three) times daily.    ? hydrOXYzine (ATARAX) 25 MG tablet     ? LEUCOVORIN CALCIUM IV Inject 400 mg/m2 into the vein every 14 (fourteen) days.    ? levofloxacin (LEVAQUIN) 500 MG tablet Take 1 tablet (500 mg total) by mouth daily. 7 tablet 0  ? loperamide (IMODIUM) 2 MG capsule Take 2 mg by mouth as needed. Use on schedule per Dr. Laural Golden.    ? loratadine (CLARITIN) 10 MG tablet Take 10 mg by mouth daily as needed for allergies (Seasonal).    ? meclizine (ANTIVERT) 25 MG tablet Take 25 mg by mouth 3 (three) times daily as needed for  dizziness (for flare up of inner ear issues.).    ? Melatonin 10 MG CAPS Take 30 mg by mouth at bedtime.    ? metoprolol succinate (TOPROL-XL) 100 MG 24 hr tablet Take 100 mg by mouth daily.    ? Naphazoline-Pheniramine (OPCON-A

## 2021-12-02 ENCOUNTER — Other Ambulatory Visit: Payer: Self-pay

## 2021-12-02 ENCOUNTER — Inpatient Hospital Stay (HOSPITAL_COMMUNITY): Payer: Medicare Other

## 2021-12-02 VITALS — BP 112/75 | HR 96 | Temp 98.1°F | Resp 20

## 2021-12-02 DIAGNOSIS — N189 Chronic kidney disease, unspecified: Secondary | ICD-10-CM | POA: Diagnosis not present

## 2021-12-02 DIAGNOSIS — Z5111 Encounter for antineoplastic chemotherapy: Secondary | ICD-10-CM | POA: Diagnosis not present

## 2021-12-02 DIAGNOSIS — D631 Anemia in chronic kidney disease: Secondary | ICD-10-CM | POA: Diagnosis not present

## 2021-12-02 DIAGNOSIS — Z95828 Presence of other vascular implants and grafts: Secondary | ICD-10-CM

## 2021-12-02 DIAGNOSIS — D509 Iron deficiency anemia, unspecified: Secondary | ICD-10-CM | POA: Diagnosis not present

## 2021-12-02 DIAGNOSIS — C20 Malignant neoplasm of rectum: Secondary | ICD-10-CM

## 2021-12-02 DIAGNOSIS — I129 Hypertensive chronic kidney disease with stage 1 through stage 4 chronic kidney disease, or unspecified chronic kidney disease: Secondary | ICD-10-CM | POA: Diagnosis not present

## 2021-12-02 MED ORDER — HEPARIN SOD (PORK) LOCK FLUSH 100 UNIT/ML IV SOLN
500.0000 [IU] | Freq: Once | INTRAVENOUS | Status: AC | PRN
  Administered 2021-12-02: 500 [IU]

## 2021-12-02 MED ORDER — SODIUM CHLORIDE 0.9% FLUSH
10.0000 mL | INTRAVENOUS | Status: DC | PRN
  Administered 2021-12-02: 10 mL

## 2021-12-02 NOTE — Progress Notes (Signed)
Pt presents today for chemotherapy pump disconnection per provider's order. Port flushed easily without diffculty with 55m of NS and 531mof heparin. Good blood return noted and needle removed intact. No bruising or swelling noted at the site. ? ?Discharged from clinic ambulatory in stable condition. Alert and oriented x 3. [facility] with [facility] scheduled.   ?

## 2021-12-02 NOTE — Patient Instructions (Signed)
[place]  Discharge Instructions: ?Thank you for choosing [facility] to provide your oncology and hematology care.  ?If you have a lab appointment with the [place], please come in thru the Main Entrance and check in at the main information desk. ? ?Wear comfortable clothing and clothing appropriate for easy access to any Portacath or PICC line.  ? ?We strive to give you quality time with your provider. You may need to reschedule your appointment if you arrive late (15 or more minutes).  Arriving late affects you and other patients whose appointments are after yours.  Also, if you miss three or more appointments without notifying the office, you may be dismissed from the clinic at the provider?s discretion.    ?  ?For prescription refill requests, have your pharmacy contact our office and allow 72 hours for refills to be completed.   ? ?Today you received chemotherapy pump disconnection. ? ? ?BELOW ARE SYMPTOMS THAT SHOULD BE REPORTED IMMEDIATELY: ?*FEVER GREATER THAN 100.4 F (38 ?C) OR HIGHER ?*CHILLS OR SWEATING ?*NAUSEA AND VOMITING THAT IS NOT CONTROLLED WITH YOUR NAUSEA MEDICATION ?*UNUSUAL SHORTNESS OF BREATH ?*UNUSUAL BRUISING OR BLEEDING ?*URINARY PROBLEMS (pain or burning when urinating, or frequent urination) ?*BOWEL PROBLEMS (unusual diarrhea, constipation, pain near the anus) ?TENDERNESS IN MOUTH AND THROAT WITH OR WITHOUT PRESENCE OF ULCERS (sore throat, sores in mouth, or a toothache) ?UNUSUAL RASH, SWELLING OR PAIN  ?UNUSUAL VAGINAL DISCHARGE OR ITCHING  ? ?Items with * indicate a potential emergency and should be followed up as soon as possible or go to the Emergency Department if any problems should occur. ? ?Please show the CHEMOTHERAPY ALERT CARD or IMMUNOTHERAPY ALERT CARD at check-in to the Emergency Department and triage nurse. ? ?Should you have questions after your visit or need to cancel or reschedule your appointment, please contact [facility] [phone]  and follow the prompts.  Office hours are 8:00 a.m. to 4:30 p.m. Monday - Friday. Please note that voicemails left after 4:00 p.m. may not be returned until the following business day.  We are closed weekends and major holidays. You have access to a nurse at all times for urgent questions. Please call the main number to the clinic [phone] and follow the prompts. ? ?For any non-urgent questions, you may also contact your provider using MyChart. We now offer e-Visits for anyone 85 and older to request care online for non-urgent symptoms. For details visit [email or web address]. ?  ?Also download the MyChart app! Go to the app store, search "MyChart", open the app, select [place], and log in with your MyChart username and password. ? ?Due to Covid, a mask is required upon entering the hospital/clinic. If you do not have a mask, one will be given to you upon arrival. For doctor visits, patients may have 1 support person aged 70 or older with them. For treatment visits, patients cannot have anyone with them due to current Covid guidelines and our immunocompromised population.  ?

## 2021-12-07 ENCOUNTER — Other Ambulatory Visit: Payer: Self-pay

## 2021-12-07 ENCOUNTER — Encounter (HOSPITAL_BASED_OUTPATIENT_CLINIC_OR_DEPARTMENT_OTHER): Payer: Self-pay

## 2021-12-07 ENCOUNTER — Ambulatory Visit (HOSPITAL_BASED_OUTPATIENT_CLINIC_OR_DEPARTMENT_OTHER)
Admission: RE | Admit: 2021-12-07 | Discharge: 2021-12-07 | Disposition: A | Discharge status: Home / Self Care | Payer: Medicare Other | Source: Ambulatory Visit | Attending: Hematology | Admitting: Hematology

## 2021-12-07 DIAGNOSIS — C20 Malignant neoplasm of rectum: Secondary | ICD-10-CM | POA: Diagnosis not present

## 2021-12-07 DIAGNOSIS — N2889 Other specified disorders of kidney and ureter: Secondary | ICD-10-CM | POA: Diagnosis not present

## 2021-12-07 DIAGNOSIS — J8410 Pulmonary fibrosis, unspecified: Secondary | ICD-10-CM | POA: Diagnosis not present

## 2021-12-07 DIAGNOSIS — J984 Other disorders of lung: Secondary | ICD-10-CM | POA: Diagnosis not present

## 2021-12-07 DIAGNOSIS — C218 Malignant neoplasm of overlapping sites of rectum, anus and anal canal: Secondary | ICD-10-CM | POA: Diagnosis not present

## 2021-12-07 DIAGNOSIS — K5730 Diverticulosis of large intestine without perforation or abscess without bleeding: Secondary | ICD-10-CM | POA: Diagnosis not present

## 2021-12-07 MED ORDER — HEPARIN SOD (PORK) LOCK FLUSH 100 UNIT/ML IV SOLN
500.0000 [IU] | Freq: Once | INTRAVENOUS | Status: AC
  Administered 2021-12-07: 500 [IU] via INTRAVENOUS

## 2021-12-07 MED ORDER — SODIUM CHLORIDE 0.9% FLUSH
10.0000 mL | INTRAVENOUS | Status: DC | PRN
  Filled 2021-12-07: qty 10

## 2021-12-07 MED ORDER — IOHEXOL 300 MG/ML  SOLN
100.0000 mL | Freq: Once | INTRAMUSCULAR | Status: AC | PRN
  Administered 2021-12-07: 75 mL via INTRAVENOUS

## 2021-12-12 ENCOUNTER — Ambulatory Visit (INDEPENDENT_AMBULATORY_CARE_PROVIDER_SITE_OTHER): Payer: Medicare Other | Admitting: Gastroenterology

## 2021-12-12 ENCOUNTER — Encounter (INDEPENDENT_AMBULATORY_CARE_PROVIDER_SITE_OTHER): Payer: Self-pay | Admitting: Gastroenterology

## 2021-12-12 VITALS — BP 107/71 | HR 106 | Temp 97.8°F | Ht 72.0 in | Wt 273.1 lb

## 2021-12-12 DIAGNOSIS — C20 Malignant neoplasm of rectum: Secondary | ICD-10-CM | POA: Diagnosis not present

## 2021-12-12 DIAGNOSIS — K52831 Collagenous colitis: Secondary | ICD-10-CM

## 2021-12-12 NOTE — Patient Instructions (Signed)
Continue colestipol 4 g qday ?Follow up with Dr. Marcello Moores and Dr. Delton Coombes ?

## 2021-12-12 NOTE — Progress Notes (Addendum)
[name], M.D. ?Gastroenterology & Hepatology ?[facility] ?[address] ?[place],  [address] ? ?Primary Care Physician: ?Neale Burly, MD ?[address] ?[place] [address] ? ?I will communicate my assessment and recommendations to the referring MD via EMR. ? ?Problems: ?Rectal adenocarcinoma - Stage IIIB (cT2, cN2a, cM0) ?Collagenous colitis ? ?History of Present Illness: ?Louis House is a 66 y.o. male with past medical history of rectal adenocarcinoma - Stage IIIB (cT2, cN2a, cM0), collagenous colitis, diabetes, hypertension, gout, hyperlipidemia and anxiety , who presents for follow up of rectal cancer and collagenous colitis. ? ?The patient was last seen on 06/13/2021. At that time, the patient had his colestipol increased to 4 g every day. ? ? Patient started RTX on 06/08/2021, currently on capecitabine 2 g twice a day. Extension studies did not show any extension to any intraabdominal or pelvic organs, no extension to brain. (MRI and CT). However there was presence of small 4 LN. Stage IIIB (cT2, cN2a, cM0).  Since last appointment, he has been on FOLFOX chemotherapy, he completed 8 cycles.  Has not undergone any surgical intervention yet, was seen by Dr. Delton Coombes on 11/30/2021, was Ordered repeat CT chest, abdomen with contrast which did not show any masses or any metastatic disease.  Also has MRI of the pelvis scheduled on 12/16/2021.  He is supposed to have a follow-up appointment with Dr. Delton Coombes to discuss these findings. He has an appointment to see Dr. Marcello Moores in May 2023. ? ?States that he has felt improvement of his bowel movements. He has an average of 2-3 normal consistency bowel movements per day. He has not felt any constipation or diarrhea. Usually takes 4 g colestipol every day. The patient denies having any nausea, vomiting, fever, chills, hematochezia, melena, hematemesis, abdominal distention, abdominal pain,  diarrhea, jaundice, pruritus or weight loss. ? ?Last Colonoscopy:  04/12/2021 that showed presence of a fungating mass at 8 cm from the anus which was consistent with a moderate to poorly differentiated adenocarcinoma.  Random colonic biopsies were also consistent with collagenous colitis.  ? ?Past Medical History: ?Past Medical History:  ?Diagnosis Date  ? Anxiety   ? Diabetes ([place]) 01/12/2017  ? Diarrhea 02/21/2017  ? Essential hypertension, benign 01/12/2017  ? Gout   ? High cholesterol 01/12/2017  ? Neuropathy   ? Port-A-Cath in place 07/28/2021  ? Sleep apnea   ? Vertigo   ? ? ?Past Surgical History: ?Past Surgical History:  ?Procedure Laterality Date  ? BIOPSY  04/06/2017  ? Procedure: BIOPSY;  Surgeon: Rogene Houston, MD;  [facility];  Service: Endoscopy;;  colon  ? BIOPSY  04/12/2021  ? Procedure: BIOPSY;  Surgeon: Montez Morita, Quillian Quince, MD;  [facility]: [facility];  Service: Gastroenterology;;  nodular area in ascending colon biopsy ?random colon bx  ? bone spurs    ? CATARACT EXTRACTION W/PHACO Left 04/22/2018  ? Procedure: CATARACT EXTRACTION PHACO AND INTRAOCULAR LENS PLACEMENT LEFT EYE;  Surgeon: Tonny Branch, MD;  Location: [facility];  Service: Ophthalmology;  Laterality: Left;  CDE: 7.58  ? COLONOSCOPY WITH PROPOFOL N/A 04/06/2017  ? Procedure: COLONOSCOPY WITH PROPOFOL;  Surgeon: Rogene Houston, MD;  [facility];  Service: Endoscopy;  Laterality: N/A;  1:45  ? COLONOSCOPY WITH PROPOFOL N/A 04/12/2021  ? Procedure: COLONOSCOPY WITH PROPOFOL;  Surgeon: Harvel Quale, MD;  [facility];  Service: Gastroenterology;  Laterality: N/A;  9:05  ? FLEXIBLE SIGMOIDOSCOPY  N/A 05/10/2021  ? Procedure: FLEXIBLE SIGMOIDOSCOPY;  Surgeon: Montez Morita, Qu[place] Quince, MD;  [facility];  Service: Gastroenterology;  Laterality: N/A;  9:50 ASA 1 Per Dr C no Anesthesia, no PAT - ok per Pamala Hurry  ? kneee arthroscopy    ? both knees  ? Left shoulder surgery from injury    ?  PORTACATH PLACEMENT Left 08/08/2021  ? Procedure: INSERTION PORT-A-CATH;  Surgeon: Aviva Signs, MD;  Location: [facility];  Service: General;  Laterality: Left;  ? Umblical hernia    ? ? ?Family History:History reviewed. No pertinent family history. ? ?Social History: ?Social History  ? ?Tobacco Use  ?Smoking Status Never  ?Smokeless Tobacco Never  ? ?Social History  ? ?Substance and Sexual Activity  ?Alcohol Use No  ? ?Social History  ? ?Substance and Sexual Activity  ?Drug Use No  ? ? ?Allergies: ?No Known Allergies ? ?Medications: ?Current Outpatient Medications  ?Medication Sig Dispense Refill  ? alfuzosin (UROXATRAL) 10 MG 24 hr tablet Take 10 mg by mouth at bedtime. 2200    ? allopurinol (ZYLOPRIM) 300 MG tablet Take 300 mg by mouth daily.    ? atorvastatin (LIPITOR) 40 MG tablet Take 40 mg by mouth daily.    ? benazepril (LOTENSIN) 40 MG tablet Take 40 mg by mouth daily.  0  ? busPIRone (BUSPAR) 15 MG tablet Take 15 mg by mouth 2 (two) times daily.    ? chlorthalidone (HYGROTON) 50 MG tablet Take 50 mg by mouth daily.    ? colestipol (COLESTID) 1 g tablet Take 4 tablets (4 g total) by mouth daily. Take 4 hours apart form your other medications 360 tablet 3  ? Cyanocobalamin (VITAMIN B-12) 5000 MCG SUBL Place 15,000 mcg under the tongue daily.    ? diazepam (VALIUM) 10 MG tablet Take 10 mg by mouth at bedtime.     ? diclofenac (VOLTAREN) 75 MG EC tablet Take 75 mg by mouth 2 (two) times daily.    ? diphenoxylate-atropine (LOMOTIL) 2.5-0.025 MG tablet Take 1 tablet by mouth 4 (four) times daily as needed for diarrhea or loose stools. 60 tablet 2  ? DULoxetine (CYMBALTA) 30 MG capsule Take 30 mg by mouth daily as needed.    ? fluorouracil CALGB 47096 2,400 mg/m2 in sodium chloride 0.9 % 150 mL Inject 2,400 mg/m2 into the vein over 48 hr.    ? FLUOROURACIL IV Inject 400 mg/m2 into the vein every 14 (fourteen) days.    ? fluticasone (FLONASE) 50 MCG/ACT nasal spray Place 3-4 sprays into both nostrils daily.    ?  folic acid (FOLVITE) 1 MG tablet folic acid 1 mg tablet ? TAKE 1 TABLET BY MOUTH DAILY    ? gabapentin (NEURONTIN) 400 MG capsule Take 800 mg by mouth 2 (two) times daily. 400-800 mg as needed for pain    ? HYDROcodone-acetaminophen (NORCO) 10-325 MG tablet Take 1 tablet by mouth 3 (three) times daily.    ? hydrOXYzine (ATARAX) 25 MG tablet     ? LEUCOVORIN CALCIUM IV Inject 400 mg/m2 into the vein every 14 (fourteen) days.    ? levofloxacin (LEVAQUIN) 500 MG tablet Take 1 tablet (500 mg total) by mouth daily. 7 tablet 0  ? lidocaine-prilocaine (EMLA) cream Apply a small amount to port a cath site (do not rub in) and cover with plastic wrap 1 hour prior to chemotherapy appointments 30 g 3  ? loperamide (IMODIUM) 2 MG capsule Take 2 mg by mouth as needed. Use on  schedule per Dr. Laural Golden.    ? loratadine (CLARITIN) 10 MG tablet Take 10 mg by mouth daily as needed for allergies (Seasonal).    ? meclizine (ANTIVERT) 25 MG tablet Take 25 mg by mouth 3 (three) times daily as needed for dizziness (for flare up of inner ear issues.).    ? Melatonin 10 MG CAPS Take 30 mg by mouth at bedtime.    ? metoprolol succinate (TOPROL-XL) 100 MG 24 hr tablet Take 100 mg by mouth daily.    ? naloxone (NARCAN) nasal spray 4 mg/0.1 mL     ? Naphazoline-Pheniramine (OPCON-A) 0.027-0.315 % SOLN Place 1 drop into both eyes daily as needed (irritation).    ? Omega-3 Fatty Acids (OMEGA-3 FISH OIL PO) Take 2,160 mg by mouth daily. 720 mg per cap    ? oxymetazoline (AFRIN) 0.05 % nasal spray Place 3 sprays into both nostrils 2 (two) times daily.    ? potassium chloride SA (KLOR-CON M) 20 MEQ tablet Take 1 tablet (20 mEq total) by mouth daily. 30 tablet 3  ? prochlorperazine (COMPAZINE) 10 MG tablet Take 1 tablet (10 mg total) by mouth in the morning and at bedtime. Take 66mn before chemotherapy 60 tablet 3  ? sulfaSALAzine (AZULFIDINE) 500 MG tablet sulfasalazine 500 mg tablet ? TAKE 2 TABLETS BY MOUTH TWICE DAILY    ? tizanidine (ZANAFLEX) 2  MG capsule Take 2 mg by mouth 3 (three) times daily as needed for muscle spasms.    ? traZODone (DESYREL) 50 MG tablet Take 50 mg by mouth at bedtime.    ? ?Current Facility-Administered Medications  ?Medication Do

## 2021-12-16 ENCOUNTER — Ambulatory Visit (HOSPITAL_COMMUNITY)
Admission: RE | Admit: 2021-12-16 | Discharge: 2021-12-16 | Disposition: A | Discharge status: Home / Self Care | Payer: Medicare Other | Source: Ambulatory Visit | Attending: Hematology | Admitting: Hematology

## 2021-12-16 DIAGNOSIS — K6289 Other specified diseases of anus and rectum: Secondary | ICD-10-CM | POA: Diagnosis not present

## 2021-12-16 DIAGNOSIS — R600 Localized edema: Secondary | ICD-10-CM | POA: Diagnosis not present

## 2021-12-16 DIAGNOSIS — K5730 Diverticulosis of large intestine without perforation or abscess without bleeding: Secondary | ICD-10-CM | POA: Diagnosis not present

## 2021-12-16 DIAGNOSIS — C20 Malignant neoplasm of rectum: Secondary | ICD-10-CM | POA: Insufficient documentation

## 2021-12-19 ENCOUNTER — Other Ambulatory Visit (HOSPITAL_COMMUNITY): Payer: Self-pay | Admitting: Hematology

## 2021-12-20 ENCOUNTER — Inpatient Hospital Stay (HOSPITAL_COMMUNITY): Payer: Medicare Other | Attending: Hematology | Admitting: Hematology

## 2021-12-20 VITALS — BP 115/77 | HR 114 | Temp 98.1°F | Resp 18 | Wt 274.3 lb

## 2021-12-20 DIAGNOSIS — M549 Dorsalgia, unspecified: Secondary | ICD-10-CM | POA: Insufficient documentation

## 2021-12-20 DIAGNOSIS — I10 Essential (primary) hypertension: Secondary | ICD-10-CM | POA: Insufficient documentation

## 2021-12-20 DIAGNOSIS — E119 Type 2 diabetes mellitus without complications: Secondary | ICD-10-CM | POA: Insufficient documentation

## 2021-12-20 DIAGNOSIS — R197 Diarrhea, unspecified: Secondary | ICD-10-CM | POA: Insufficient documentation

## 2021-12-20 DIAGNOSIS — G8929 Other chronic pain: Secondary | ICD-10-CM | POA: Insufficient documentation

## 2021-12-20 DIAGNOSIS — E876 Hypokalemia: Secondary | ICD-10-CM | POA: Insufficient documentation

## 2021-12-20 DIAGNOSIS — C19 Malignant neoplasm of rectosigmoid junction: Secondary | ICD-10-CM | POA: Insufficient documentation

## 2021-12-20 DIAGNOSIS — G629 Polyneuropathy, unspecified: Secondary | ICD-10-CM | POA: Diagnosis not present

## 2021-12-20 DIAGNOSIS — C20 Malignant neoplasm of rectum: Secondary | ICD-10-CM

## 2021-12-20 DIAGNOSIS — Z801 Family history of malignant neoplasm of trachea, bronchus and lung: Secondary | ICD-10-CM | POA: Diagnosis not present

## 2021-12-20 NOTE — Progress Notes (Signed)
? ?[place] ?[address] ?[place], [place] [address] ? ? ?CLINIC:  ?Medical Oncology/Hematology ? ?PCP:  ?Neale Burly, MD ?[place] / [place] [address] ?[phone] ? ? ?REASON FOR VISIT:  ?Follow-up for rectal cancer ? ?PRIOR THERAPY: Capecitabine + XRT ? ?NGS Results: not done ? ?CURRENT THERAPY: FOLFOX q14d x 4 months (started 08/10/21) ? ?BRIEF ONCOLOGIC HISTORY:  ?Oncology History  ?Rectal cancer ([place])  ?05/01/2021 Initial Diagnosis  ? Rectal adenocarcinoma ([place]) ?  ?05/30/2021 - 05/30/2021 Chemotherapy  ? Patient is on Treatment Plan : COLORECTAL Capecitabine + XRT  ?   ?08/10/2021 -  Chemotherapy  ? Patient is on Treatment Plan : COLORECTAL FOLFOX q14d x 4 months  ?   ? ? ?CANCER STAGING: ? Cancer Staging  ?Rectal cancer ([place]) ?Staging form: Colon and Rectum, AJCC 8th Edition ?- Clinical stage from 05/18/2021: Stage IIIB (cT2, cN2a, cM0) - Unsigned ? ? ?INTERVAL HISTORY:  ?[name], a 66 y.o. male, returns for routine follow-up of his rectal cancer. Louis House was last seen on 11/30/2021.  ? ?Today he reports feeling good. He continues to have tingling in his fingers and mouth when touching or consuming cold items, and he reports this is improving and denies any associated pain. He also reports constant tingling in his feet which started after treatment. His appetite is good, and he denies n/v/d. He reports his energy is still low.  ? ?REVIEW OF SYSTEMS:  ?Review of Systems  ?Constitutional:  Positive for fatigue. Negative for appetite change.  ?Gastrointestinal:  Negative for diarrhea, nausea and vomiting.  ?Musculoskeletal:  Positive for back pain.  ?Neurological:  Positive for dizziness and numbness.  ?All other systems reviewed and are negative. ? ?PAST MEDICAL/SURGICAL HISTORY:  ?Past Medical History:  ?Diagnosis Date  ? Anxiety   ? Diabetes ([place]) 01/12/2017  ? Diarrhea 02/21/2017  ? Essential hypertension, benign 01/12/2017  ? Gout   ? High cholesterol 01/12/2017  ? Neuropathy    ? Port-A-Cath in place 07/28/2021  ? Sleep apnea   ? Vertigo   ? ?Past Surgical History:  ?Procedure Laterality Date  ? BIOPSY  04/06/2017  ? Procedure: BIOPSY;  Surgeon: Rogene Houston, MD;  [facility];  Service: Endoscopy;;  colon  ? BIOPSY  04/12/2021  ? Procedure: BIOPSY;  Surgeon: Montez Morita, Quillian Quince, MD;  Location: [facility];  Service: Gastroenterology;;  nodular area in ascending colon biopsy ?random colon bx  ? bone spurs    ? CATARACT EXTRACTION W/PHACO Left 04/22/2018  ? Procedure: CATARACT EXTRACTION PHACO AND INTRAOCULAR LENS PLACEMENT LEFT EYE;  Surgeon: Tonny Branch, MD;  Location: [facility];  Service: Ophthalmology;  Laterality: Left;  CDE: 7.58  ? COLONOSCOPY WITH PROPOFOL N/A 04/06/2017  ? Procedure: COLONOSCOPY WITH PROPOFOL;  Surgeon: Rogene Houston, MD;  [facility];  Service: Endoscopy;  Laterality: N/A;  1:45  ? COLONOSCOPY WITH PROPOFOL N/A 04/12/2021  ? Procedure: COLONOSCOPY WITH PROPOFOL;  Surgeon: Harvel Quale, MD;  [facility];  Service: Gastroenterology;  Laterality: N/A;  9:05  ? FLEXIBLE SIGMOIDOSCOPY N/A 05/10/2021  ? Procedure: FLEXIBLE SIGMOIDOSCOPY;  Surgeon: Montez Morita, Quillian Quince, MD;  [facility];  Service: Gastroenterology;  Laterality: N/A;  9:50 ASA 1 Per Dr C no Anesthesia, no PAT - ok per Pamala Hurry  ? kneee arthroscopy    ? both knees  ? Left shoulder surgery from injury    ? PORTACATH PLACEMENT Left  08/08/2021  ? Procedure: INSERTION PORT-A-CATH;  Surgeon: Aviva Signs, MD;  Location: [facility];  Service: General;  Laterality: Left;  ? Umblical hernia    ? ? ?SOCIAL HISTORY:  ?Social History  ? ?Socioeconomic History  ? Marital status: Divorced  ?  Spouse name: Not on file  ? Number of children: Not on file  ? Years of education: Not on file  ? Highest education level: Not on file  ?Occupational History  ? Not on file  ?Tobacco Use  ? Smoking status: Never  ? Smokeless tobacco: Never  ?Vaping Use  ? Vaping  Use: Never used  ?Substance and Sexual Activity  ? Alcohol use: No  ? Drug use: No  ? Sexual activity: Yes  ?  Birth control/protection: None  ?Other Topics Concern  ? Not on file  ?Social History Narrative  ? Not on file  ? ?Social Determinants of Health  ? ?Financial Resource Strain: Low Risk   ? Difficulty of Paying Living Expenses: Not very hard  ?Food Insecurity: No Food Insecurity  ? Worried About Charity fundraiser in the Last Year: Never true  ? Ran Out of Food in the Last Year: Never true  ?Transportation Needs: No Transportation Needs  ? Lack of Transportation (Medical): No  ? Lack of Transportation (Non-Medical): No  ?Physical Activity: Insufficiently Active  ? Days of Exercise per Week: 2 days  ? Minutes of Exercise per Session: 20 min  ?Stress: No Stress Concern Present  ? Feeling of Stress : Not at all  ?Social Connections: Socially Isolated  ? Frequency of Communication with Friends and Family: More than three times a week  ? Frequency of Social Gatherings with Friends and Family: More than three times a week  ? Attends Religious Services: Never  ? Active Member of Clubs or Organizations: No  ? Attends Archivist Meetings: Never  ? Marital Status: Divorced  ?Intimate Partner Violence: Not At Risk  ? Fear of Current or Ex-Partner: No  ? Emotionally Abused: No  ? Physically Abused: No  ? Sexually Abused: No  ? ? ?FAMILY HISTORY:  ?No family history on file. ? ?CURRENT MEDICATIONS:  ?Current Outpatient Medications  ?Medication Sig Dispense Refill  ? alfuzosin (UROXATRAL) 10 MG 24 hr tablet Take 10 mg by mouth at bedtime. 2200    ? allopurinol (ZYLOPRIM) 300 MG tablet Take 300 mg by mouth daily.    ? atorvastatin (LIPITOR) 40 MG tablet Take 40 mg by mouth daily.    ? benazepril (LOTENSIN) 40 MG tablet Take 40 mg by mouth daily.  0  ? busPIRone (BUSPAR) 15 MG tablet Take 15 mg by mouth 2 (two) times daily.    ? chlorthalidone (HYGROTON) 50 MG tablet Take 50 mg by mouth daily.    ? colestipol  (COLESTID) 1 g tablet Take 4 tablets (4 g total) by mouth daily. Take 4 hours apart form your other medications 360 tablet 3  ? Cyanocobalamin (VITAMIN B-12) 5000 MCG SUBL Place 15,000 mcg under the tongue daily.    ? diazepam (VALIUM) 10 MG tablet Take 10 mg by mouth at bedtime.     ? diclofenac (VOLTAREN) 75 MG EC tablet Take 75 mg by mouth 2 (two) times daily.    ? diphenoxylate-atropine (LOMOTIL) 2.5-0.025 MG tablet Take 1 tablet by mouth 4 (four) times daily as needed for diarrhea or loose stools. 60 tablet 2  ? DULoxetine (CYMBALTA) 30 MG capsule Take 30 mg by mouth daily as  needed.    ? fluticasone (FLONASE) 50 MCG/ACT nasal spray Place 3-4 sprays into both nostrils daily.    ? gabapentin (NEURONTIN) 400 MG capsule Take 800 mg by mouth 2 (two) times daily. 400-800 mg as needed for pain    ? HYDROcodone-acetaminophen (NORCO) 10-325 MG tablet Take 1 tablet by mouth 3 (three) times daily.    ? hydrOXYzine (ATARAX) 25 MG tablet     ? KLOR-CON M20 20 MEQ tablet TAKE 1 TABLET BY MOUTH EVERY House 30 tablet 3  ? loperamide (IMODIUM) 2 MG capsule Take 2 mg by mouth as needed. Use on schedule per Dr. Laural Golden.    ? loratadine (CLARITIN) 10 MG tablet Take 10 mg by mouth daily as needed for allergies (Seasonal).    ? meclizine (ANTIVERT) 25 MG tablet Take 25 mg by mouth 3 (three) times daily as needed for dizziness (for flare up of inner ear issues.).    ? Melatonin 10 MG CAPS Take 30 mg by mouth at bedtime.    ? metoprolol succinate (TOPROL-XL) 100 MG 24 hr tablet Take 100 mg by mouth daily.    ? naloxone (NARCAN) nasal spray 4 mg/0.1 mL     ? Naphazoline-Pheniramine (OPCON-A) 0.027-0.315 % SOLN Place 1 drop into both eyes daily as needed (irritation).    ? Omega-3 Fatty Acids (OMEGA-3 FISH OIL PO) Take 2,160 mg by mouth daily. 720 mg per cap    ? sulfaSALAzine (AZULFIDINE) 500 MG tablet sulfasalazine 500 mg tablet ? TAKE 2 TABLETS BY MOUTH TWICE DAILY    ? tizanidine (ZANAFLEX) 2 MG capsule Take 2 mg by mouth 3 (three)  times daily as needed for muscle spasms.    ? traZODone (DESYREL) 50 MG tablet Take 50 mg by mouth at bedtime.    ? ?Current Facility-Administered Medications  ?Medication Dose Route Frequency Provider Last

## 2021-12-20 NOTE — Patient Instructions (Signed)
[place] at [facility] ?Discharge Instructions ? ?You were seen and examined today by Dr. Delton Coombes. He reviewed your most recent scans and everything looks good. Keep appointment with Dr. Marcello Moores for surgery. Please keep follow up appointments as scheduled in 2 months. ? ? ?Thank you for choosing [place] at [facility] to provide your oncology and hematology care.  To afford each patient quality time with our provider, please arrive at least 15 minutes before your scheduled appointment time.  ? ?If you have a lab appointment with the [facility] please come in thru the Main Entrance and check in at the main information desk. ? ?You need to re-schedule your appointment should you arrive 10 or more minutes late.  We strive to give you quality time with our providers, and arriving late affects you and other patients whose appointments are after yours.  Also, if you no show three or more times for appointments you may be dismissed from the clinic at the providers discretion.     ?Again, thank you for choosing [facility].  Our hope is that these requests will decrease the amount of time that you wait before being seen by our physicians.       ?_____________________________________________________________ ? ?Should you have questions after your visit to [facility], please contact our office at [phone] and follow the prompts.  Our office hours are 8:00 a.m. and 4:30 p.m. Monday - Friday.  Please note that voicemails left after 4:00 p.m. may not be returned until the following business day.  We are closed weekends and major holidays.  You do have access to a nurse 24-7, just call the main number to the clinic [phone] and do not press any options, hold on the line and a nurse will answer the phone.   ? ?For prescription refill requests, have your pharmacy contact our office and allow 72 hours.   ? ?Due to Covid, you will need to  wear a mask upon entering the hospital. If you do not have a mask, a mask will be given to you at the Main Entrance upon arrival. For doctor visits, patients may have 1 support person age 6 or older with them. For treatment visits, patients can not have anyone with them due to social distancing guidelines and our immunocompromised population.  ? ?  ?

## 2021-12-22 ENCOUNTER — Other Ambulatory Visit (HOSPITAL_COMMUNITY): Payer: Self-pay

## 2021-12-22 DIAGNOSIS — C20 Malignant neoplasm of rectum: Secondary | ICD-10-CM

## 2021-12-22 MED ORDER — DIPHENOXYLATE-ATROPINE 2.5-0.025 MG PO TABS: 1.0000 | ORAL_TABLET | Freq: Four times a day (QID) | ORAL | 2 refills | Status: DC | PRN

## 2022-01-05 ENCOUNTER — Encounter (HOSPITAL_COMMUNITY): Payer: Self-pay | Admitting: *Deleted

## 2022-01-05 ENCOUNTER — Other Ambulatory Visit (HOSPITAL_COMMUNITY): Payer: Self-pay | Admitting: *Deleted

## 2022-01-05 MED ORDER — GABAPENTIN 300 MG PO CAPS: 300.0000 mg | ORAL_CAPSULE | Freq: Three times a day (TID) | ORAL | 6 refills | Status: DC

## 2022-01-05 NOTE — Progress Notes (Addendum)
Patient called reporting that he is still having tingling in his feet 24/7.  Patient stated that he told Dr.Katragadda at last office visit that he thought he could hold off and not take anything for it, but it is starting to interfere with his ADLs.  He is requesting something for this tingling.  After speaking with Dr. Delton Coombes, he doesn't want to prescribe anything for the patient at this time.  There is no burning sensations at this time or pain, only tingling. I spoke with patient and he verbalizes understanding and will call us should his symptoms change.    ?

## 2022-01-09 ENCOUNTER — Ambulatory Visit: Payer: Self-pay | Admitting: General Surgery

## 2022-01-09 DIAGNOSIS — C20 Malignant neoplasm of rectum: Secondary | ICD-10-CM | POA: Diagnosis not present

## 2022-01-09 NOTE — H&P (Signed)
?REFERRING PHYSICIAN:  Derek Jack, MD ?  ?PROVIDER:  Monico Blitz, MD ?  ?MRN: [id number] ?DOB: Jan 01, 1956 ?DATE OF ENCOUNTER: 01/09/2022 ?  ?Subjective  ?  ?Chief Complaint: Rectal Cancer ?  ?  ?  ?History of Present Illness: ?Louis House is a 66 y.o. male who is seen today as an office consultation at the request of Dr. Delton Coombes for evaluation of rectal cancer.  Patient states he was treated in 2018 with sulfasalazine for colitis and his symptoms resolved.  He then started developing new symptoms earlier this year including persistent diarrhea without hematochezia.  He underwent a repeat colonoscopy.  Random biopsies were positive for collagenous colitis.  A mid rectal cancer was also noted.  MRI showed T2N2 disease.  CT scan showed no signs of metastatic disease.  Tumor was tattooed approximately 8 cm proximal to the anus, distally.  Patient has been undergoing total neoadjuvant treatment over the last 6 months.  He underwent chemo RT, completing this in November 2022.  He then received 8 cycles of FOLFOX through November 30, 2021. ?  ?Review of Systems: ?A complete review of systems was obtained from the patient.  I have reviewed this information and discussed as appropriate with the patient.  See HPI as well for other ROS.  ?  ?  ?Medical History: ?Past Medical History  ?    ?Past Medical History:  ?Diagnosis Date  ? Arthritis    ? History of cancer    ? Hyperlipidemia    ? Sleep apnea    ?  ?  ?  ?   ?Patient Active Problem List  ?Diagnosis  ? Obstructive sleep apnea of adult  ? Rectal adenocarcinoma (CMS-HCC)  ? Diabetes (CMS-HCC)  ? Anxiety disorder  ?  ?  ?Past Surgical History  ?History reviewed. No pertinent surgical history.  ?  ?  ?Allergies  ?No Known Allergies  ? ?  ?      ?Current Outpatient Medications on File Prior to Visit  ?Medication Sig Dispense Refill  ? alfuzosin (UROXATRAL) 10 mg ER tablet alfuzosin ER 10 mg tablet,extended release 24 hr      ? allopurinoL (ZYLOPRIM) 300  MG tablet allopurinol 300 mg tablet ? TAKE 1 TABLET BY MOUTH EVERY DAY      ? atorvastatin (LIPITOR) 40 MG tablet atorvastatin 40 mg tablet      ? benazepriL (LOTENSIN) 40 MG tablet benazepril 40 mg tablet ? TAKE 1 TABLET BY MOUTH EVERY DAY      ? chlorhexidine (PERIDEX) 0.12 % solution chlorhexidine gluconate 0.12 % mouthwash ? RINSE MOUTH with 15 ML (ONE capful) FOR 30 seconds in THE morning AND evening AFTER toothbrushing. Spit AFTER rinsing DO not swallow      ? chlorthalidone 50 MG tablet chlorthalidone 50 mg tablet ? TAKE 1 TABLET BY MOUTH EVERY DAY      ? colestipoL (COLESTID) 1 gram tablet colestipol 1 gram tablet ? TAKE 1 TABLET(1G)BY ORAL ROUTE TWICE A DAY      ? diazePAM (VALIUM) 10 MG tablet diazepam 10 mg tablet ? TAKE 1 TABLET BY MOUTH AT BEDTIME AS NEEDED      ? diclofenac (VOLTAREN) 75 MG EC tablet diclofenac sodium 75 mg tablet,delayed release      ? DULoxetine (CYMBALTA) 20 MG DR capsule Take 20 mg by mouth once daily      ? fluticasone propionate (FLONASE) 50 mcg/actuation nasal spray Place into one nostril      ? folic acid (  FOLVITE) 1 MG tablet folic acid 1 mg tablet ? TAKE 1 TABLET BY MOUTH EVERY DAY      ? gabapentin (NEURONTIN) 400 MG capsule gabapentin 400 mg capsule ? TAKE ONE CAPSULE BY MOUTH FOUR TIMES DAILY      ? HYDROcodone-acetaminophen (NORCO) 10-325 mg tablet hydrocodone 10 mg-acetaminophen 325 mg tablet ? TAKE 1 TABLET BY MOUTH THREE TIMES DAILY AS NEEDED      ? ibuprofen (MOTRIN) 800 MG tablet ibuprofen 800 mg tablet ? TAKE 1 TABLET BY MOUTH EVERY 8 HOURS AS NEEDED      ? loratadine (CLARITIN) 10 mg tablet Take by mouth      ? meclizine (ANTIVERT) 25 mg tablet meclizine 25 mg tablet ? TAKE 1 TABLET BY MOUTH THREE TIMES DAILY      ? melatonin 10 mg Cap Take by mouth      ? metoprolol succinate (TOPROL-XL) 100 MG XL tablet metoprolol succinate ER 100 mg tablet,extended release 24 hr      ? naphazoline-pheniramine (OPCON-A) 0.02675-0.315 % Drop Apply to eye      ? oxymetazoline  (AFRIN) 0.05 % nasal spray Place into one nostril      ? peg-electrolyte (NULYTELY) solution peg-electrolyte solution 420 gram oral solution      ? sulfaSALAzine (AZULFIDINE) 500 mg tablet Take 1,000 mg by mouth 2 (two) times daily      ? tiZANidine (ZANAFLEX) 2 MG capsule Take by mouth      ? traZODone (DESYREL) 50 MG tablet trazodone 50 mg tablet ? TAKE 1 TABLET BY MOUTH AT BEDTIME      ?  ?No current facility-administered medications on file prior to visit.  ?  ?  ?Family History  ?History reviewed. No pertinent family history.  ?  ?  ?Social History  ?  ?   ?Tobacco Use  ?Smoking Status Never  ?Smokeless Tobacco Never  ?  ?  ?Social History  ?Social History  ?  ?    ?Socioeconomic History  ? Marital status: Unknown  ?Tobacco Use  ? Smoking status: Never  ? Smokeless tobacco: Never  ?Substance and Sexual Activity  ? Alcohol use: Never  ? Drug use: Never  ?  ?  ?  ?Objective:  ?  ?  ?There were no vitals filed for this visit.  ?  ?Exam ?Gen: NAD ?CV: RRR ?Lungs: CTA ?Abd: Soft ?  ?  ?  ?Labs, Imaging and Diagnostic Testing: ?CT scans show no signs of metastatic disease on pretreatment and posttreatment CT scans.  CEA normal.  Pretreatment MRI showed likely T2 N2 disease.  Posttreatment MRI shows no focal rectal tumor, N0 disease.  MRI was suboptimal due to lack of rectal contrast. ?  ?Assessment and Plan:  ?Diagnoses and all orders for this visit: ?  ?Malignant neoplasm of rectum (CMS-HCC) ?  ?Patient appears to have had a good response to TNT on MRI.  He is a poor candidate for surgery due to comorbidities of DM and social situation.  We discussed performing a flexible sigmoidoscopy to evaluate tumor response to radiation.  If he has had a complete response, I think it would be reasonable to proceed with watch and wait technique.  Risks of procedure include bleeding, missed pathology and need for additional procedures.   ? ?Rosario Adie, MD ? ?Colorectal and General Surgery ?[facility]  ?  ?

## 2022-01-18 DIAGNOSIS — G4733 Obstructive sleep apnea (adult) (pediatric): Secondary | ICD-10-CM | POA: Diagnosis not present

## 2022-01-18 DIAGNOSIS — Z79891 Long term (current) use of opiate analgesic: Secondary | ICD-10-CM | POA: Diagnosis not present

## 2022-01-18 DIAGNOSIS — M5416 Radiculopathy, lumbar region: Secondary | ICD-10-CM | POA: Diagnosis not present

## 2022-01-18 DIAGNOSIS — R252 Cramp and spasm: Secondary | ICD-10-CM | POA: Diagnosis not present

## 2022-01-18 DIAGNOSIS — M545 Low back pain, unspecified: Secondary | ICD-10-CM | POA: Diagnosis not present

## 2022-01-24 ENCOUNTER — Encounter (HOSPITAL_COMMUNITY): Payer: Self-pay | Admitting: *Deleted

## 2022-01-26 ENCOUNTER — Encounter (HOSPITAL_COMMUNITY): Payer: Self-pay | Admitting: General Surgery

## 2022-01-27 NOTE — Progress Notes (Signed)
Attempted to obtain medical history via telephone, unable to reach at this time. Unable to leave voicemail to return pre surgical testing department's phone call,due to mailbox full.  

## 2022-02-03 ENCOUNTER — Ambulatory Visit (HOSPITAL_COMMUNITY)
Admission: RE | Admit: 2022-02-03 | Discharge: 2022-02-03 | Disposition: A | Discharge status: Home / Self Care | Payer: Medicare Other | Attending: General Surgery | Admitting: General Surgery

## 2022-02-03 ENCOUNTER — Encounter (HOSPITAL_COMMUNITY)
Admission: RE | Disposition: A | Discharge status: Home / Self Care | Payer: Self-pay | Source: Home / Self Care | Attending: General Surgery

## 2022-02-03 ENCOUNTER — Encounter (HOSPITAL_COMMUNITY): Payer: Self-pay | Admitting: General Surgery

## 2022-02-03 DIAGNOSIS — K6289 Other specified diseases of anus and rectum: Secondary | ICD-10-CM | POA: Insufficient documentation

## 2022-02-03 DIAGNOSIS — Z9221 Personal history of antineoplastic chemotherapy: Secondary | ICD-10-CM | POA: Insufficient documentation

## 2022-02-03 DIAGNOSIS — Z85048 Personal history of other malignant neoplasm of rectum, rectosigmoid junction, and anus: Secondary | ICD-10-CM | POA: Diagnosis not present

## 2022-02-03 DIAGNOSIS — Z1211 Encounter for screening for malignant neoplasm of colon: Secondary | ICD-10-CM | POA: Diagnosis not present

## 2022-02-03 DIAGNOSIS — G4733 Obstructive sleep apnea (adult) (pediatric): Secondary | ICD-10-CM | POA: Diagnosis not present

## 2022-02-03 DIAGNOSIS — E119 Type 2 diabetes mellitus without complications: Secondary | ICD-10-CM | POA: Insufficient documentation

## 2022-02-03 DIAGNOSIS — Z923 Personal history of irradiation: Secondary | ICD-10-CM | POA: Insufficient documentation

## 2022-02-03 HISTORY — PX: FLEXIBLE SIGMOIDOSCOPY: SHX5431

## 2022-02-03 HISTORY — PX: BIOPSY: SHX5522

## 2022-02-03 SURGERY — SIGMOIDOSCOPY, FLEXIBLE

## 2022-02-03 NOTE — Discharge Instructions (Signed)
YOU HAD AN ENDOSCOPIC PROCEDURE TODAY: Refer to the procedure report and other information in the discharge instructions given to you for any specific questions about what was found during the examination. If this information does not answer your questions, please call [facility] office at [phone] to clarify.   YOU SHOULD EXPECT: Some feelings of bloating in the abdomen. Passage of more gas than usual. Walking can help get rid of the air that was put into your GI tract during the procedure and reduce the bloating. If you had a lower endoscopy (such as a colonoscopy or flexible sigmoidoscopy) you may notice spotting of blood in your stool or on the toilet paper. Some abdominal soreness may be present for a day or two, also.  DIET: Your first meal following the procedure should be a light meal and then it is ok to progress to your normal diet. A half-sandwich or bowl of soup is an example of a good first meal. Heavy or fried foods are harder to digest and may make you feel nauseous or bloated. Drink plenty of fluids but you should avoid alcoholic beverages for 24 hours. If you had a esophageal dilation, please see attached instructions for diet.    ACTIVITY: Your care partner should take you home directly after the procedure. You should plan to take it easy, moving slowly for the rest of the day. You can resume normal activity the day after the procedure however YOU SHOULD NOT DRIVE, use power tools, machinery or perform tasks that involve climbing or major physical exertion for 24 hours (because of the sedation medicines used during the test).   SYMPTOMS TO REPORT IMMEDIATELY: A general surgeon can be reached at any hour. Please call [phone]  for any of the following symptoms:  Following lower endoscopy (colonoscopy, flexible sigmoidoscopy) Excessive amounts of blood in the stool  Significant tenderness, worsening of abdominal pains  Swelling of the abdomen that is new, acute   Fever of 100 or higher   FOLLOW UP:  If any biopsies were taken you will be contacted by phone or by letter within the next 1-3 weeks. Call [phone]  if you have not heard about the biopsies in 3 weeks.  Please also call with any specific questions about appointments or follow up tests.

## 2022-02-03 NOTE — H&P (Signed)
REFERRING PHYSICIAN:  Derek Jack, MD   PROVIDER:  Monico Blitz, MD   MRN: [id number] DOB: 1956/04/13   Subjective    Chief Complaint: Rectal Cancer       History of Present Illness: Louis House is a 66 y.o. male who is seen today as an office consultation at the request of Dr. Delton Coombes for evaluation of rectal cancer.  Patient states he was treated in 2018 with sulfasalazine for colitis and his symptoms resolved.  He then started developing new symptoms earlier this year including persistent diarrhea without hematochezia.  He underwent a repeat colonoscopy.  Random biopsies were positive for collagenous colitis.  A mid rectal cancer was also noted.  MRI showed T2N2 disease.  CT scan showed no signs of metastatic disease.  Tumor was tattooed approximately 8 cm proximal to the anus, distally.  Patient has been undergoing total neoadjuvant treatment over the last 6 months.  He underwent chemo RT, completing this in November 2022.  He then received 8 cycles of FOLFOX through November 30, 2021.   Review of Systems: A complete review of systems was obtained from the patient.  I have reviewed this information and discussed as appropriate with the patient.  See HPI as well for other ROS.      Medical History: Past Medical History         Past Medical History:  Diagnosis Date   Arthritis     History of cancer     Hyperlipidemia     Sleep apnea               Patient Active Problem List  Diagnosis   Obstructive sleep apnea of adult   Rectal adenocarcinoma (CMS-HCC)   Diabetes (CMS-HCC)   Anxiety disorder      Past Surgical History  History reviewed. No pertinent surgical history.      Allergies  No Known Allergies                 Current Outpatient Medications on File Prior to Visit  Medication Sig Dispense Refill   alfuzosin (UROXATRAL) 10 mg ER tablet alfuzosin ER 10 mg tablet,extended release 24 hr       allopurinoL (ZYLOPRIM) 300 MG tablet  allopurinol 300 mg tablet  TAKE 1 TABLET BY MOUTH EVERY DAY       atorvastatin (LIPITOR) 40 MG tablet atorvastatin 40 mg tablet       benazepriL (LOTENSIN) 40 MG tablet benazepril 40 mg tablet  TAKE 1 TABLET BY MOUTH EVERY DAY       chlorhexidine (PERIDEX) 0.12 % solution chlorhexidine gluconate 0.12 % mouthwash  RINSE MOUTH with 15 ML (ONE capful) FOR 30 seconds in THE morning AND evening AFTER toothbrushing. Spit AFTER rinsing DO not swallow       chlorthalidone 50 MG tablet chlorthalidone 50 mg tablet  TAKE 1 TABLET BY MOUTH EVERY DAY       colestipoL (COLESTID) 1 gram tablet colestipol 1 gram tablet  TAKE 1 TABLET(1G)BY ORAL ROUTE TWICE A DAY       diazePAM (VALIUM) 10 MG tablet diazepam 10 mg tablet  TAKE 1 TABLET BY MOUTH AT BEDTIME AS NEEDED       diclofenac (VOLTAREN) 75 MG EC tablet diclofenac sodium 75 mg tablet,delayed release       DULoxetine (CYMBALTA) 20 MG DR capsule Take 20 mg by mouth once daily       fluticasone propionate (FLONASE) 50 mcg/actuation nasal spray Place into one nostril  folic acid (FOLVITE) 1 MG tablet folic acid 1 mg tablet  TAKE 1 TABLET BY MOUTH EVERY DAY       gabapentin (NEURONTIN) 400 MG capsule gabapentin 400 mg capsule  TAKE ONE CAPSULE BY MOUTH FOUR TIMES DAILY       HYDROcodone-acetaminophen (NORCO) 10-325 mg tablet hydrocodone 10 mg-acetaminophen 325 mg tablet  TAKE 1 TABLET BY MOUTH THREE TIMES DAILY AS NEEDED       ibuprofen (MOTRIN) 800 MG tablet ibuprofen 800 mg tablet  TAKE 1 TABLET BY MOUTH EVERY 8 HOURS AS NEEDED       loratadine (CLARITIN) 10 mg tablet Take by mouth       meclizine (ANTIVERT) 25 mg tablet meclizine 25 mg tablet  TAKE 1 TABLET BY MOUTH THREE TIMES DAILY       melatonin 10 mg Cap Take by mouth       metoprolol succinate (TOPROL-XL) 100 MG XL tablet metoprolol succinate ER 100 mg tablet,extended release 24 hr       naphazoline-pheniramine (OPCON-A) 0.02675-0.315 % Drop Apply to eye       oxymetazoline (AFRIN) 0.05 %  nasal spray Place into one nostril       peg-electrolyte (NULYTELY) solution peg-electrolyte solution 420 gram oral solution       sulfaSALAzine (AZULFIDINE) 500 mg tablet Take 1,000 mg by mouth 2 (two) times daily       tiZANidine (ZANAFLEX) 2 MG capsule Take by mouth       traZODone (DESYREL) 50 MG tablet trazodone 50 mg tablet  TAKE 1 TABLET BY MOUTH AT BEDTIME        No current facility-administered medications on file prior to visit.      Family History  History reviewed. No pertinent family history.      Social History         Tobacco Use  Smoking Status Never  Smokeless Tobacco Never      Social History  Social History           Socioeconomic History   Marital status: Unknown  Tobacco Use   Smoking status: Never   Smokeless tobacco: Never  Substance and Sexual Activity   Alcohol use: Never   Drug use: Never        Objective:      Vitals:   02/03/22 1102  BP: 123/67  Pulse: (!) 59  Resp: 12  Temp: 98.2 F (36.8 C)  SpO2: 99%      Exam Gen: NAD CV: RRR Lungs: CTA Abd: Soft       Labs, Imaging and Diagnostic Testing: CT scans show no signs of metastatic disease on pretreatment and posttreatment CT scans.  CEA normal.  Pretreatment MRI showed likely T2 N2 disease.  Posttreatment MRI shows no focal rectal tumor, N0 disease.  MRI was suboptimal due to lack of rectal contrast.   Assessment and Plan:  Diagnoses and all orders for this visit:   Malignant neoplasm of rectum (CMS-HCC)   Patient appears to have had a good response to TNT on MRI.  He is a poor candidate for surgery due to comorbidities of DM and social situation.  We discussed performing a flexible sigmoidoscopy to evaluate tumor response to radiation.  If he has had a complete response, I think it would be reasonable to proceed with watch and wait technique.  Risks of procedure include bleeding, missed pathology and need for additional procedures.     Rosario Adie, MD   Colorectal  and  [facility]

## 2022-02-03 NOTE — Op Note (Signed)
[facility] Patient Name: Louis House Procedure Date: 02/03/2022 MRN: [id number] Attending MD: Leighton Ruff , MD Date of Birth: 27-May-1956 CSN: [id number] Age: 66 Admit Type: Outpatient Procedure:                Flexible Sigmoidoscopy Indications:              High risk colon cancer surveillance: Personal                            history of rectal cancer Providers:                Leighton Ruff, MD, [place] Fanny, Darliss Cheney,                            Technician, Gloris Ham, Technician, Tyna Jaksch Technician Referring MD:              Medicines:                None Complications:            No immediate complications. Estimated Blood Loss:     Estimated blood loss was minimal. Procedure:                Pre-Anesthesia Assessment:                           - Prior to the procedure, a History and Physical                            was performed, and patient medications and                            allergies were reviewed. The patient's tolerance of                            previous anesthesia was also reviewed. The risks                            and benefits of the procedure and the sedation                            options and risks were discussed with the patient.                            All questions were answered, and informed consent                            was obtained. Prior Anticoagulants: The patient has                            taken no previous anticoagulant or antiplatelet                            agents. ASA Grade Assessment: III -  A patient with                            severe systemic disease. After reviewing the risks                            and benefits, the patient was deemed in                            satisfactory condition to undergo the procedure.                           After obtaining informed consent, the scope was                            passed under direct vision. The  [id number]-[id number] ([id number])                            Olympus colonoscope was introduced through the anus                            and advanced to the the rectosigmoid junction. The                            flexible sigmoidoscopy was accomplished without                            difficulty. The patient tolerated the procedure                            well. The quality of the bowel preparation was                            excellent. Scope In: Scope Out: Findings:      The perianal and digital rectal examinations were normal. Pertinent       negatives include normal sphincter tone.      A tattoo was seen in the mid rectum. A scar was found at the tattoo       site. There was no sign of malignancy. Biopsies were taken with a cold       forceps for histology. Estimated blood loss was minimal. Impression:               - No specimens collected.                           - Scarred mucosa in the mid rectum. Biopsied. Moderate Sedation:      Not Applicable - Patient had care per Anesthesia. Recommendation:           - Discharge patient to home.                           - Written discharge instructions were provided to                            the patient.                           -  Resume previous diet. Procedure Code(s):        --- Professional ---                           [id number], 66, Sigmoidoscopy, flexible; with biopsy,                            single or multiple Diagnosis Code(s):        --- Professional ---                           Z85.048, Personal history of other malignant                            neoplasm of rectum, rectosigmoid junction, and anus                           K62.89, Other specified diseases of anus and rectum CPT copyright 2019 [employer]. All rights reserved. The codes documented in this report are preliminary and upon coder review may  be revised to meet current compliance requirements. Leighton Ruff, MD Leighton Ruff, MD 9/78/4784  11:44:55 AM This report has been signed electronically. Number of Addenda: 0

## 2022-02-07 ENCOUNTER — Encounter (HOSPITAL_COMMUNITY): Payer: Self-pay | Admitting: General Surgery

## 2022-02-07 LAB — SURGICAL PATHOLOGY

## 2022-02-13 ENCOUNTER — Other Ambulatory Visit (HOSPITAL_COMMUNITY): Payer: Self-pay

## 2022-02-13 DIAGNOSIS — C20 Malignant neoplasm of rectum: Secondary | ICD-10-CM

## 2022-02-13 NOTE — Progress Notes (Signed)
Order placed for CT CAP and labs to be done in September per Dr. Delton Coombes

## 2022-02-14 ENCOUNTER — Encounter (HOSPITAL_COMMUNITY): Payer: Self-pay

## 2022-02-14 NOTE — Progress Notes (Signed)
Call placed to patient to go over upcoming September appts as recommended by Dr. Delton Coombes. Detailed VM left asking that the patient return my call.

## 2022-02-18 ENCOUNTER — Other Ambulatory Visit: Payer: Self-pay | Admitting: Nurse Practitioner

## 2022-02-21 DIAGNOSIS — C20 Malignant neoplasm of rectum: Secondary | ICD-10-CM | POA: Diagnosis not present

## 2022-02-22 DIAGNOSIS — E11610 Type 2 diabetes mellitus with diabetic neuropathic arthropathy: Secondary | ICD-10-CM | POA: Diagnosis not present

## 2022-02-22 DIAGNOSIS — G609 Hereditary and idiopathic neuropathy, unspecified: Secondary | ICD-10-CM | POA: Diagnosis not present

## 2022-02-22 DIAGNOSIS — N182 Chronic kidney disease, stage 2 (mild): Secondary | ICD-10-CM | POA: Diagnosis not present

## 2022-02-22 DIAGNOSIS — M1049 Other secondary gout, multiple sites: Secondary | ICD-10-CM | POA: Diagnosis not present

## 2022-02-22 DIAGNOSIS — N401 Enlarged prostate with lower urinary tract symptoms: Secondary | ICD-10-CM | POA: Diagnosis not present

## 2022-02-22 DIAGNOSIS — I10 Essential (primary) hypertension: Secondary | ICD-10-CM | POA: Diagnosis not present

## 2022-02-22 DIAGNOSIS — E1165 Type 2 diabetes mellitus with hyperglycemia: Secondary | ICD-10-CM | POA: Diagnosis not present

## 2022-02-22 DIAGNOSIS — A932 Colorado tick fever: Secondary | ICD-10-CM | POA: Diagnosis not present

## 2022-02-22 DIAGNOSIS — H8113 Benign paroxysmal vertigo, bilateral: Secondary | ICD-10-CM | POA: Diagnosis not present

## 2022-03-01 DIAGNOSIS — M545 Low back pain, unspecified: Secondary | ICD-10-CM | POA: Diagnosis not present

## 2022-03-01 DIAGNOSIS — G4733 Obstructive sleep apnea (adult) (pediatric): Secondary | ICD-10-CM | POA: Diagnosis not present

## 2022-03-01 DIAGNOSIS — M5416 Radiculopathy, lumbar region: Secondary | ICD-10-CM | POA: Diagnosis not present

## 2022-03-01 DIAGNOSIS — Z79891 Long term (current) use of opiate analgesic: Secondary | ICD-10-CM | POA: Diagnosis not present

## 2022-03-01 DIAGNOSIS — G620 Drug-induced polyneuropathy: Secondary | ICD-10-CM | POA: Diagnosis not present

## 2022-05-06 IMAGING — MR MR PELVIS W/O CM
9 of 10 series · 43 of 48 positions shown · non-contrast
Comparison: 05/12/2021

CLINICAL DATA: Follow-up rectal carcinoma. Neoadjuvant chemotherapy
and radiation therapy.

EXAM:
MRI PELVIS WITHOUT CONTRAST
TECHNIQUE: Multiplanar multisequence MR imaging of the pelvis was performed. No
intravenous contrast was administered.
Patient declined rectal contrast administration.

[Series 2: T2 · sagittal · 3.0mm · 1.19mm/px · 5 of 35 slices shown (1 of 4)]
[im 1/35]
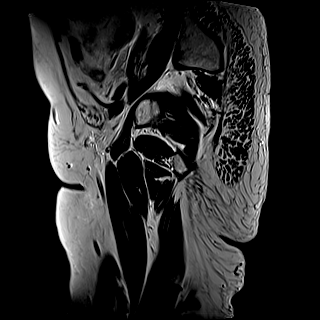
[im 9/35]
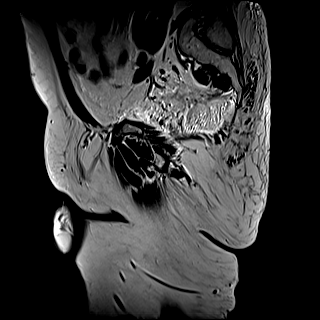
[im 18/35]
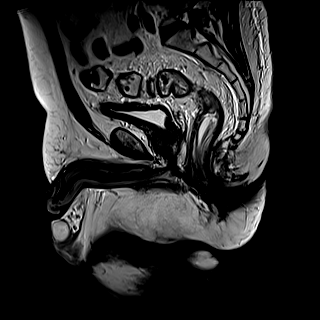
[im 26/35]
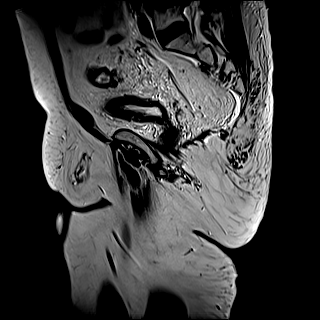
[im 35/35]
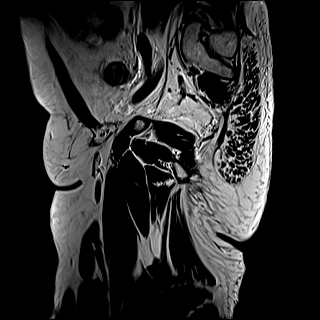

[Series 3: T2 · coronal · 3.0mm · 0.56mm/px · 6 of 45 slices shown (2 of 4)]
[im 1/45]
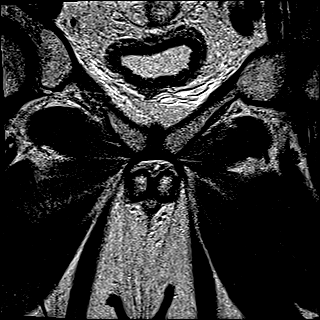
[im 9/45]
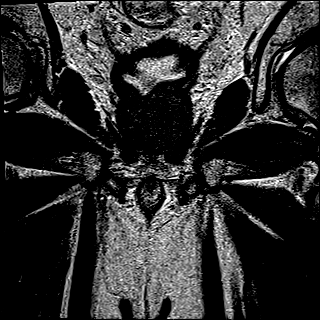
[im 18/45]
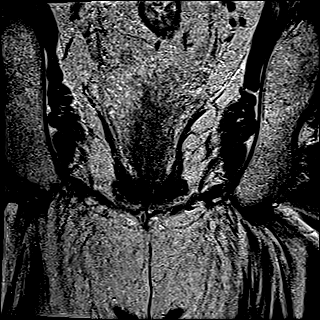
[im 27/45]
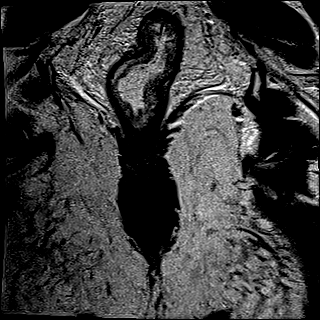
[im 36/45]
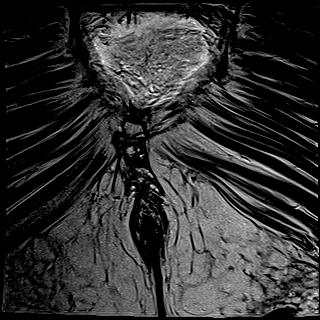
[im 45/45]
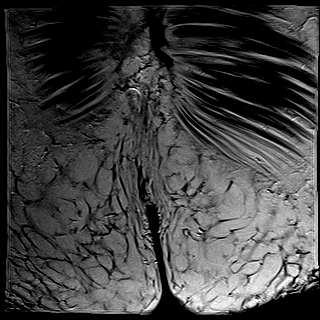

[Series 4: T2 · axial · 5.0mm · 0.59mm/px · z∈[-79,+136]mm · 4 of 37 slices shown (3 of 4)]
[im 1/37]
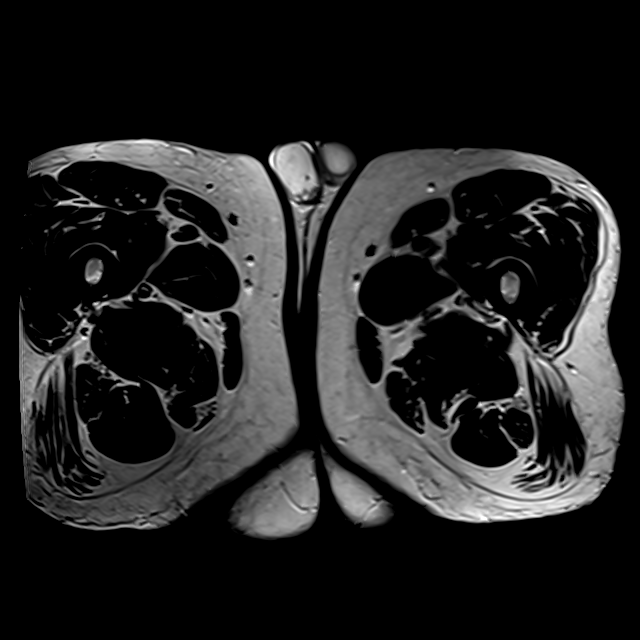
[im 13/37]
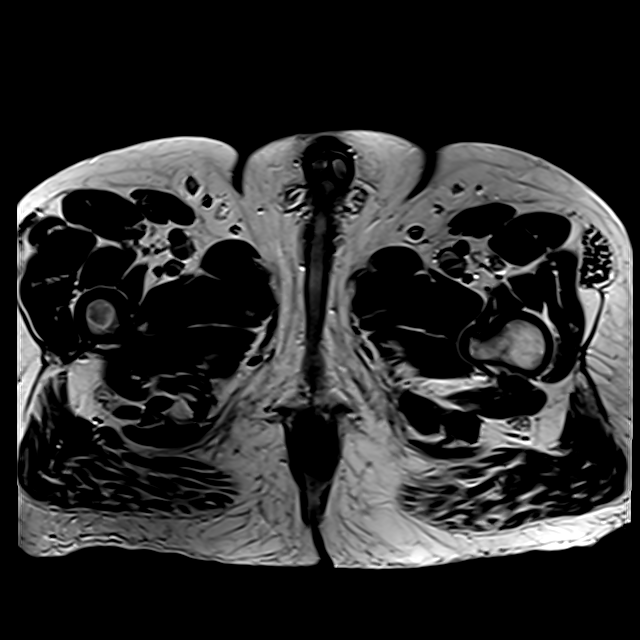
[im 25/37]
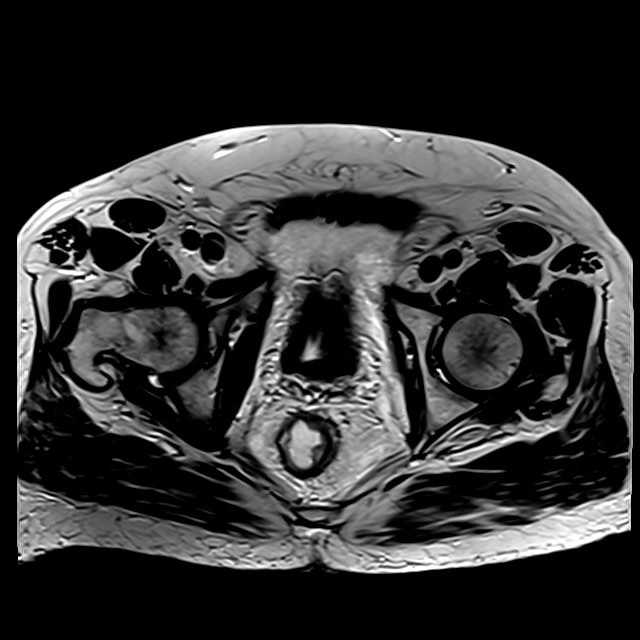
[im 37/37]
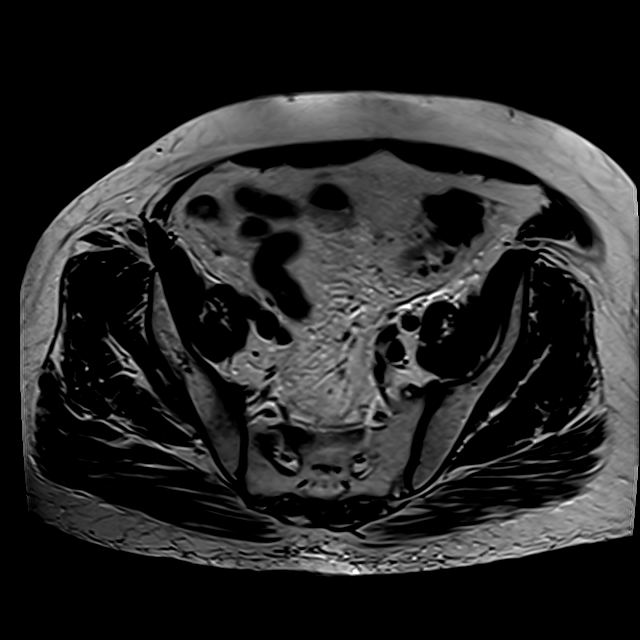

[Series 5: ax dwi_tracew · axial · 3.0mm · 1.48mm/px · z∈[-36,+93]mm · 5 of 44 slices shown (1 of 3)]
[im 1/44]
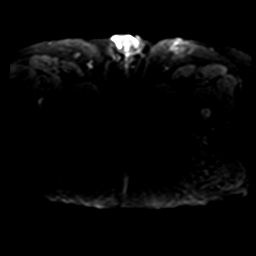
[im 11/44]
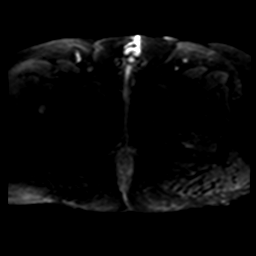
[im 22/44]
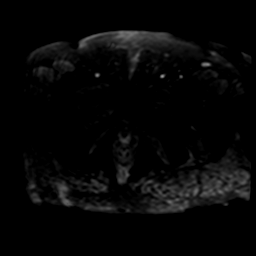
[im 33/44]
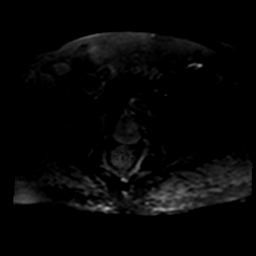
[im 44/44]
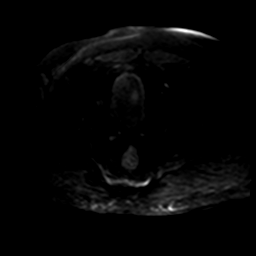

[Series 5: ax dwi_tracew · axial · 3.0mm · 1.48mm/px · z∈[-36,+93]mm · 5 of 44 slices shown (2 of 3)]
[im 1/44]
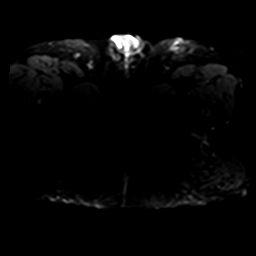
[im 11/44]
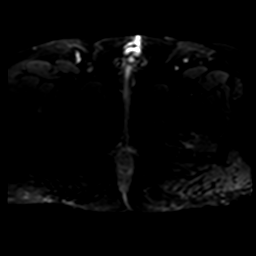
[im 22/44]
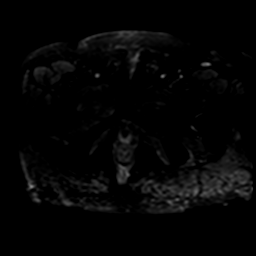
[im 33/44]
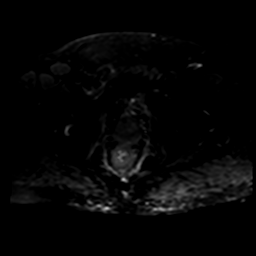
[im 44/44]
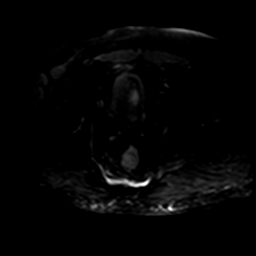

[Series 5: ax dwi_tracew · axial · 3.0mm · 1.48mm/px · z∈[-36,+93]mm · 5 of 44 slices shown (3 of 3)]
[im 1/44]
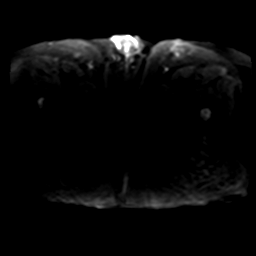
[im 11/44]
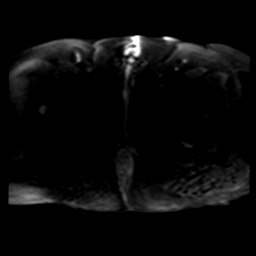
[im 22/44]
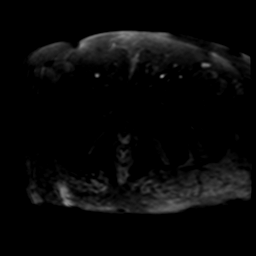
[im 33/44]
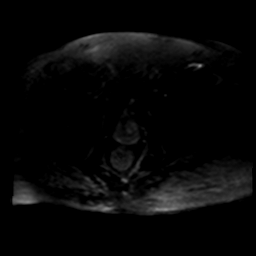
[im 44/44]
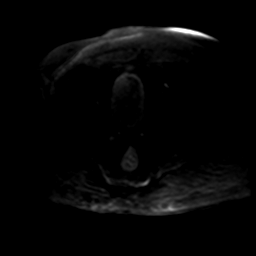

[Series 6: ax dwi_adc · axial · 3.0mm · 1.48mm/px · z∈[-36,+93]mm · 5 of 44 slices shown]
[im 1/44]
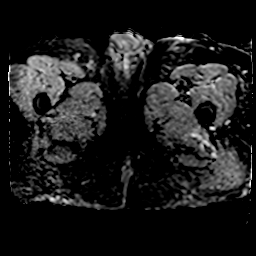
[im 11/44]
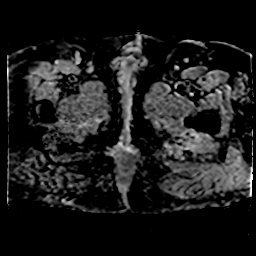
[im 22/44]
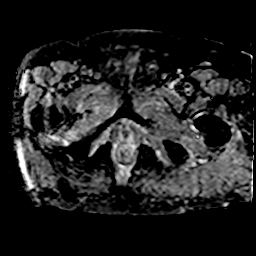
[im 33/44]
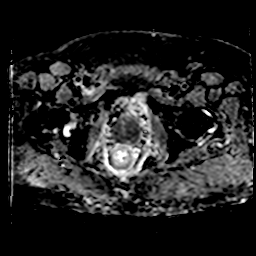
[im 44/44]
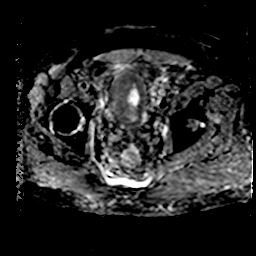

[Series 7: ax dwi_calc_bval · axial · 3.0mm · 1.48mm/px · z∈[-36,+93]mm · 5 of 44 slices shown]
[im 1/44]
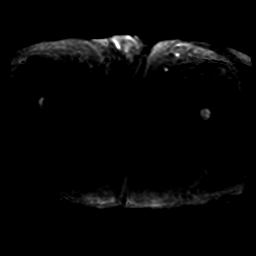
[im 11/44]
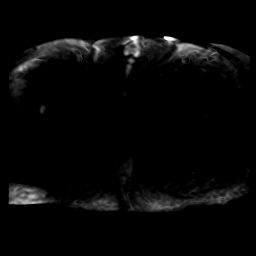
[im 22/44]
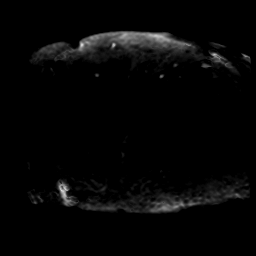
[im 33/44]
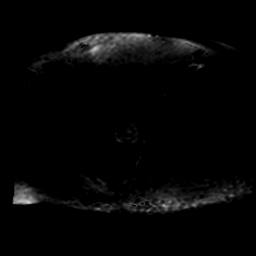
[im 44/44]
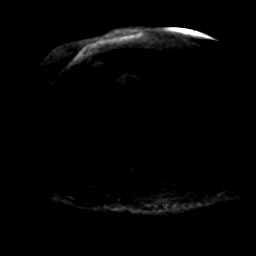

[Series 8: T2 · axial · 3.0mm · 0.56mm/px · z∈[-16,+48]mm · 3 of 45 slices shown (4 of 4)]
[im 1/45]
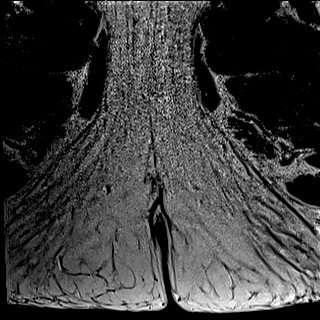
[im 12/45]
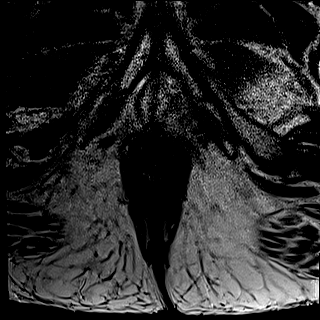
[im 23/45]
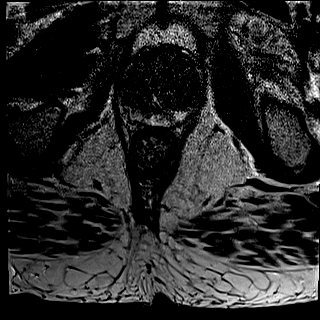

[43 of 48 positions shown; findings below may reference images not displayed]

FINDINGS: TUMOR LOCATION

Evaluation is suboptimal due to lack of rectal contrast. No focal
tumor is seen within the rectum on today's exam.

T - CATEGORY

Extension through Muscularis Propria: No=T1/T2

Shortest Distance from Mass to Mesorectal Fascia: N/a

Extramural Vascular Invasion/Tumor Thrombus: No

Invasion of Anterior Peritoneal Reflection: No

Involvement of Adjacent Organs or Pelvic Sidewall: No

Levator Ani Involvement: No

N - CATEGORY

Regional Lymph Nodes >=5mm: None=N0

Other: Sigmoid diverticulosis is seen, without evidence of
diverticulitis. Mild diffuse rectal mucosal thickening is seen,
likely due to prior radiation therapy. Mild presacral soft tissue
edema, consistent with post treatment changes.
IMPRESSION: Technically suboptimal exam due to lack of rectal contrast. No focal
rectal tumor identified.

Rectal adenocarcinoma stage by imaging: T1/T2, N0, [place].

## 2022-05-24 DIAGNOSIS — Z79891 Long term (current) use of opiate analgesic: Secondary | ICD-10-CM | POA: Diagnosis not present

## 2022-05-24 DIAGNOSIS — R252 Cramp and spasm: Secondary | ICD-10-CM | POA: Diagnosis not present

## 2022-05-24 DIAGNOSIS — G4733 Obstructive sleep apnea (adult) (pediatric): Secondary | ICD-10-CM | POA: Diagnosis not present

## 2022-05-24 DIAGNOSIS — G620 Drug-induced polyneuropathy: Secondary | ICD-10-CM | POA: Diagnosis not present

## 2022-05-24 DIAGNOSIS — M545 Low back pain, unspecified: Secondary | ICD-10-CM | POA: Diagnosis not present

## 2022-05-24 DIAGNOSIS — M5416 Radiculopathy, lumbar region: Secondary | ICD-10-CM | POA: Diagnosis not present

## 2022-05-29 ENCOUNTER — Ambulatory Visit (HOSPITAL_COMMUNITY): Admission: RE | Admit: 2022-05-29 | Payer: Medicare Other | Source: Ambulatory Visit

## 2022-05-29 ENCOUNTER — Inpatient Hospital Stay: Payer: Medicare Other | Attending: Hematology

## 2022-05-29 DIAGNOSIS — C20 Malignant neoplasm of rectum: Secondary | ICD-10-CM | POA: Insufficient documentation

## 2022-05-29 DIAGNOSIS — E876 Hypokalemia: Secondary | ICD-10-CM | POA: Diagnosis not present

## 2022-05-29 LAB — CBC WITH DIFFERENTIAL/PLATELET
Abs Immature Granulocytes: 0.04 10*3/uL (ref 0.00–0.07)
Basophils Absolute: 0.1 10*3/uL (ref 0.0–0.1)
Basophils Relative: 1 %
Eosinophils Absolute: 0.2 10*3/uL (ref 0.0–0.5)
Eosinophils Relative: 5 %
HCT: 37.6 % — ABNORMAL LOW (ref 39.0–52.0)
Hemoglobin: 12.3 g/dL — ABNORMAL LOW (ref 13.0–17.0)
Immature Granulocytes: 1 %
Lymphocytes Relative: 11 %
Lymphs Abs: 0.5 10*3/uL — ABNORMAL LOW (ref 0.7–4.0)
MCH: 30.9 pg (ref 26.0–34.0)
MCHC: 32.7 g/dL (ref 30.0–36.0)
MCV: 94.5 fL (ref 80.0–100.0)
Monocytes Absolute: 0.4 10*3/uL (ref 0.1–1.0)
Monocytes Relative: 8 %
Neutro Abs: 3.3 10*3/uL (ref 1.7–7.7)
Neutrophils Relative %: 74 %
Platelets: 195 10*3/uL (ref 150–400)
RBC: 3.98 MIL/uL — ABNORMAL LOW (ref 4.22–5.81)
RDW: 13.2 % (ref 11.5–15.5)
WBC: 4.5 10*3/uL (ref 4.0–10.5)
nRBC: 0 % (ref 0.0–0.2)

## 2022-05-29 LAB — COMPREHENSIVE METABOLIC PANEL
ALT: 345 U/L — ABNORMAL HIGH (ref 0–44)
AST: 215 U/L — ABNORMAL HIGH (ref 15–41)
Albumin: 3.9 g/dL (ref 3.5–5.0)
Alkaline Phosphatase: 240 U/L — ABNORMAL HIGH (ref 38–126)
Anion gap: 6 (ref 5–15)
BUN: 27 mg/dL — ABNORMAL HIGH (ref 8–23)
CO2: 31 mmol/L (ref 22–32)
Calcium: 9.5 mg/dL (ref 8.9–10.3)
Chloride: 102 mmol/L (ref 98–111)
Creatinine, Ser: 1.03 mg/dL (ref 0.61–1.24)
GFR, Estimated: >60 mL/min (ref >60)
Glucose, Bld: 139 mg/dL — ABNORMAL HIGH (ref 70–99)
Potassium: 3.9 mmol/L (ref 3.5–5.1)
Sodium: 139 mmol/L (ref 135–145)
Total Bilirubin: 0.6 mg/dL (ref 0.3–1.2)
Total Protein: 7.1 g/dL (ref 6.5–8.1)

## 2022-05-30 LAB — CEA: CEA: 3.1 ng/mL (ref 0.0–4.7)

## 2022-05-31 DIAGNOSIS — Z0000 Encounter for general adult medical examination without abnormal findings: Secondary | ICD-10-CM | POA: Diagnosis not present

## 2022-05-31 DIAGNOSIS — G609 Hereditary and idiopathic neuropathy, unspecified: Secondary | ICD-10-CM | POA: Diagnosis not present

## 2022-05-31 DIAGNOSIS — M1049 Other secondary gout, multiple sites: Secondary | ICD-10-CM | POA: Diagnosis not present

## 2022-05-31 DIAGNOSIS — Z1331 Encounter for screening for depression: Secondary | ICD-10-CM | POA: Diagnosis not present

## 2022-05-31 DIAGNOSIS — N182 Chronic kidney disease, stage 2 (mild): Secondary | ICD-10-CM | POA: Diagnosis not present

## 2022-05-31 DIAGNOSIS — N401 Enlarged prostate with lower urinary tract symptoms: Secondary | ICD-10-CM | POA: Diagnosis not present

## 2022-05-31 DIAGNOSIS — Z125 Encounter for screening for malignant neoplasm of prostate: Secondary | ICD-10-CM | POA: Diagnosis not present

## 2022-05-31 DIAGNOSIS — E11610 Type 2 diabetes mellitus with diabetic neuropathic arthropathy: Secondary | ICD-10-CM | POA: Diagnosis not present

## 2022-05-31 DIAGNOSIS — I10 Essential (primary) hypertension: Secondary | ICD-10-CM | POA: Diagnosis not present

## 2022-05-31 DIAGNOSIS — H8113 Benign paroxysmal vertigo, bilateral: Secondary | ICD-10-CM | POA: Diagnosis not present

## 2022-06-05 ENCOUNTER — Encounter (INDEPENDENT_AMBULATORY_CARE_PROVIDER_SITE_OTHER): Payer: Self-pay | Admitting: Gastroenterology

## 2022-06-05 ENCOUNTER — Inpatient Hospital Stay: Payer: Medicare Other | Admitting: Hematology

## 2022-06-06 ENCOUNTER — Ambulatory Visit (HOSPITAL_COMMUNITY)
Admission: RE | Admit: 2022-06-06 | Discharge: 2022-06-06 | Disposition: A | Discharge status: Home / Self Care | Payer: Medicare Other | Source: Ambulatory Visit | Attending: Hematology | Admitting: Hematology

## 2022-06-06 DIAGNOSIS — J9811 Atelectasis: Secondary | ICD-10-CM | POA: Diagnosis not present

## 2022-06-06 DIAGNOSIS — N3289 Other specified disorders of bladder: Secondary | ICD-10-CM | POA: Diagnosis not present

## 2022-06-06 DIAGNOSIS — C20 Malignant neoplasm of rectum: Secondary | ICD-10-CM | POA: Insufficient documentation

## 2022-06-06 DIAGNOSIS — Z85048 Personal history of other malignant neoplasm of rectum, rectosigmoid junction, and anus: Secondary | ICD-10-CM | POA: Diagnosis not present

## 2022-06-06 DIAGNOSIS — K5730 Diverticulosis of large intestine without perforation or abscess without bleeding: Secondary | ICD-10-CM | POA: Diagnosis not present

## 2022-06-06 DIAGNOSIS — K760 Fatty (change of) liver, not elsewhere classified: Secondary | ICD-10-CM | POA: Diagnosis not present

## 2022-06-06 DIAGNOSIS — I700 Atherosclerosis of aorta: Secondary | ICD-10-CM | POA: Diagnosis not present

## 2022-06-06 DIAGNOSIS — I2510 Atherosclerotic heart disease of native coronary artery without angina pectoris: Secondary | ICD-10-CM | POA: Diagnosis not present

## 2022-06-06 DIAGNOSIS — K6289 Other specified diseases of anus and rectum: Secondary | ICD-10-CM | POA: Diagnosis not present

## 2022-06-06 MED ORDER — IOHEXOL 300 MG/ML  SOLN
100.0000 mL | Freq: Once | INTRAMUSCULAR | Status: AC | PRN
  Administered 2022-06-06: 100 mL via INTRAVENOUS

## 2022-06-14 ENCOUNTER — Inpatient Hospital Stay: Payer: Medicare Other

## 2022-06-14 ENCOUNTER — Inpatient Hospital Stay: Payer: Medicare Other | Attending: Hematology | Admitting: Hematology

## 2022-06-14 VITALS — BP 160/96 | HR 60 | Temp 98.0°F | Resp 16 | Wt 273.8 lb

## 2022-06-14 DIAGNOSIS — M109 Gout, unspecified: Secondary | ICD-10-CM | POA: Diagnosis not present

## 2022-06-14 DIAGNOSIS — Z923 Personal history of irradiation: Secondary | ICD-10-CM | POA: Diagnosis not present

## 2022-06-14 DIAGNOSIS — I700 Atherosclerosis of aorta: Secondary | ICD-10-CM | POA: Insufficient documentation

## 2022-06-14 DIAGNOSIS — R197 Diarrhea, unspecified: Secondary | ICD-10-CM | POA: Insufficient documentation

## 2022-06-14 DIAGNOSIS — C20 Malignant neoplasm of rectum: Secondary | ICD-10-CM | POA: Diagnosis not present

## 2022-06-14 DIAGNOSIS — E7800 Pure hypercholesterolemia, unspecified: Secondary | ICD-10-CM | POA: Diagnosis not present

## 2022-06-14 DIAGNOSIS — G629 Polyneuropathy, unspecified: Secondary | ICD-10-CM | POA: Diagnosis not present

## 2022-06-14 DIAGNOSIS — M549 Dorsalgia, unspecified: Secondary | ICD-10-CM | POA: Diagnosis not present

## 2022-06-14 DIAGNOSIS — K760 Fatty (change of) liver, not elsewhere classified: Secondary | ICD-10-CM | POA: Insufficient documentation

## 2022-06-14 DIAGNOSIS — I10 Essential (primary) hypertension: Secondary | ICD-10-CM | POA: Diagnosis not present

## 2022-06-14 DIAGNOSIS — E876 Hypokalemia: Secondary | ICD-10-CM | POA: Diagnosis not present

## 2022-06-14 DIAGNOSIS — Z9221 Personal history of antineoplastic chemotherapy: Secondary | ICD-10-CM | POA: Diagnosis not present

## 2022-06-14 DIAGNOSIS — R748 Abnormal levels of other serum enzymes: Secondary | ICD-10-CM

## 2022-06-14 DIAGNOSIS — Z79899 Other long term (current) drug therapy: Secondary | ICD-10-CM | POA: Diagnosis not present

## 2022-06-14 DIAGNOSIS — E119 Type 2 diabetes mellitus without complications: Secondary | ICD-10-CM | POA: Insufficient documentation

## 2022-06-14 LAB — HEPATIC FUNCTION PANEL
ALT: 132 U/L — ABNORMAL HIGH (ref 0–44)
AST: 61 U/L — ABNORMAL HIGH (ref 15–41)
Albumin: 4.5 g/dL (ref 3.5–5.0)
Alkaline Phosphatase: 166 U/L — ABNORMAL HIGH (ref 38–126)
Bilirubin, Direct: 0.1 mg/dL (ref 0.0–0.2)
Indirect Bilirubin: 0.7 mg/dL (ref 0.3–0.9)
Total Bilirubin: 0.8 mg/dL (ref 0.3–1.2)
Total Protein: 7.6 g/dL (ref 6.5–8.1)

## 2022-06-14 NOTE — Patient Instructions (Addendum)
[place] at [facility] Discharge Instructions   You were seen and examined today by Dr. Delton Coombes.  He reviewed the results of you CT scan which is normal.  Your liver enzymes were high on the last blood work you had done. We will repeat this test today.  We will arrange for you to have an MRI of your pelvis to further assess the state of your rectal cancer.  We will refer you back to Dr. Marcello Moores for sigmoidoscopy.   Return as scheduled.    Thank you for choosing [place] at [facility] to provide your oncology and hematology care.  To afford each patient quality time with our provider, please arrive at least 15 minutes before your scheduled appointment time.   If you have a lab appointment with the [facility] please come in thru the Main Entrance and check in at the main information desk.  You need to re-schedule your appointment should you arrive 10 or more minutes late.  We strive to give you quality time with our providers, and arriving late affects you and other patients whose appointments are after yours.  Also, if you no show three or more times for appointments you may be dismissed from the clinic at the providers discretion.     Again, thank you for choosing [facility].  Our hope is that these requests will decrease the amount of time that you wait before being seen by our physicians.       _____________________________________________________________  Should you have questions after your visit to [facility], please contact our office at [phone] and follow the prompts.  Our office hours are 8:00 a.m. and 4:30 p.m. Monday - Friday.  Please note that voicemails left after 4:00 p.m. may not be returned until the following business day.  We are closed weekends and major holidays.  You do have access to a nurse 24-7, just call the main number to the clinic [phone] and do not press any options,  hold on the line and a nurse will answer the phone.    For prescription refill requests, have your pharmacy contact our office and allow 72 hours.    Due to Covid, you will need to wear a mask upon entering the hospital. If you do not have a mask, a mask will be given to you at the Main Entrance upon arrival. For doctor visits, patients may have 1 support person age 57 or older with them. For treatment visits, patients can not have anyone with them due to social distancing guidelines and our immunocompromised population.

## 2022-06-15 ENCOUNTER — Ambulatory Visit (HOSPITAL_COMMUNITY)
Admission: RE | Admit: 2022-06-15 | Discharge: 2022-06-15 | Disposition: A | Discharge status: Home / Self Care | Payer: Medicare Other | Source: Ambulatory Visit | Attending: Hematology | Admitting: Hematology

## 2022-06-15 ENCOUNTER — Ambulatory Visit (INDEPENDENT_AMBULATORY_CARE_PROVIDER_SITE_OTHER): Payer: Medicare Other | Admitting: Gastroenterology

## 2022-06-15 DIAGNOSIS — N3289 Other specified disorders of bladder: Secondary | ICD-10-CM | POA: Diagnosis not present

## 2022-06-15 DIAGNOSIS — K6389 Other specified diseases of intestine: Secondary | ICD-10-CM | POA: Diagnosis not present

## 2022-06-15 DIAGNOSIS — C20 Malignant neoplasm of rectum: Secondary | ICD-10-CM | POA: Diagnosis not present

## 2022-06-15 DIAGNOSIS — K6289 Other specified diseases of anus and rectum: Secondary | ICD-10-CM | POA: Diagnosis not present

## 2022-06-19 ENCOUNTER — Encounter: Payer: Self-pay | Admitting: Hematology

## 2022-06-19 ENCOUNTER — Inpatient Hospital Stay (HOSPITAL_BASED_OUTPATIENT_CLINIC_OR_DEPARTMENT_OTHER): Payer: Medicare Other | Admitting: Hematology

## 2022-06-19 VITALS — BP 150/96 | HR 64 | Temp 98.4°F | Resp 18 | Wt 271.0 lb

## 2022-06-19 DIAGNOSIS — C20 Malignant neoplasm of rectum: Secondary | ICD-10-CM

## 2022-06-19 DIAGNOSIS — M549 Dorsalgia, unspecified: Secondary | ICD-10-CM | POA: Diagnosis not present

## 2022-06-19 DIAGNOSIS — G629 Polyneuropathy, unspecified: Secondary | ICD-10-CM | POA: Diagnosis not present

## 2022-06-19 DIAGNOSIS — R748 Abnormal levels of other serum enzymes: Secondary | ICD-10-CM

## 2022-06-19 DIAGNOSIS — E876 Hypokalemia: Secondary | ICD-10-CM | POA: Diagnosis not present

## 2022-06-19 DIAGNOSIS — R197 Diarrhea, unspecified: Secondary | ICD-10-CM | POA: Diagnosis not present

## 2022-06-19 DIAGNOSIS — E119 Type 2 diabetes mellitus without complications: Secondary | ICD-10-CM | POA: Diagnosis not present

## 2022-06-19 NOTE — Patient Instructions (Addendum)
[place]  Discharge Instructions  You were seen and examined today by Dr. Delton Coombes.  Dr. Delton Coombes discussed your most recent lab work and scan which revealed that everything looks good except for fatty liver.   Watch what you are eating to help with the fatty liver. Dr. Delton Coombes sent referral for you to have sigmoidoscopy with Dr. Marcello Moores in November.   Follow-up as scheduled in 3 months with labs.    Thank you for choosing [facility] to provide your oncology and hematology care.   To afford each patient quality time with our provider, please arrive at least 15 minutes before your scheduled appointment time. You may need to reschedule your appointment if you arrive late (10 or more minutes). Arriving late affects you and other patients whose appointments are after yours.  Also, if you miss three or more appointments without notifying the office, you may be dismissed from the clinic at the provider's discretion.    Again, thank you for choosing [facility].  Our hope is that these requests will decrease the amount of time that you wait before being seen by our physicians.   If you have a lab appointment with the [facility] please come in thru the Main Entrance and check in at the main information desk.           _____________________________________________________________  Should you have questions after your visit to [facility], please contact our office at [phone] and follow the prompts.  Our office hours are 8:00 a.m. to 4:30 p.m. Monday - Thursday and 8:00 a.m. to 2:30 p.m. Friday.  Please note that voicemails left after 4:00 p.m. may not be returned until the following business day.  We are closed weekends and all major holidays.  You do have access to a nurse 24-7, just call the main number to the clinic [phone] and do not press any options, hold on the line and a nurse will answer  the phone.    For prescription refill requests, have your pharmacy contact our office and allow 72 hours.    Masks are optional in the cancer centers. If you would like for your care team to wear a mask while they are taking care of you, please let them know. You may have one support person who is at least 66 years old accompany you for your appointments.

## 2022-06-19 NOTE — Progress Notes (Signed)
[place] [employer], [place] [address]   CLINIC:  Medical Oncology/Hematology  PCP:  Neale Burly, MD [place] / [place] [address] [phone]   REASON FOR VISIT:  Follow-up for rectal cancer  PRIOR THERAPY: Capecitabine + XRT  NGS Results: not done  CURRENT THERAPY: FOLFOX q14d x 4 months (started 08/10/21)  BRIEF ONCOLOGIC HISTORY:  Oncology History  Rectal cancer ([place])  05/01/2021 Initial Diagnosis   Rectal adenocarcinoma ([place])   05/30/2021 - 05/30/2021 Chemotherapy   Patient is on Treatment Plan : COLORECTAL Capecitabine + XRT     08/10/2021 - 12/02/2021 Chemotherapy   Patient is on Treatment Plan : COLORECTAL FOLFOX q14d x 4 months       CANCER STAGING:  Cancer Staging  Rectal cancer [facility]) Staging form: Colon and Rectum, AJCC 8th Edition - Clinical stage from 05/18/2021: Stage IIIB (cT2, cN2a, cM0) - Unsigned   INTERVAL HISTORY:  Mr. RANA ADORNO, a 66 y.o. male, seen for follow-up of rectal cancer.  Denies any bleeding per rectum or melena.  Reports numbness in the hands and feet has been stable.  Energy levels are 100%.  REVIEW OF SYSTEMS:  Review of Systems  Constitutional:  Negative for appetite change.  Gastrointestinal:  Negative for diarrhea, nausea and vomiting.  Neurological:  Positive for numbness. Negative for dizziness.  All other systems reviewed and are negative.   PAST MEDICAL/SURGICAL HISTORY:  Past Medical History:  Diagnosis Date   Anxiety    Diabetes ([place]) 01/12/2017   Diarrhea 02/21/2017   Essential hypertension, benign 01/12/2017   Gout    High cholesterol 01/12/2017   Neuropathy    Port-A-Cath in place 07/28/2021   Sleep apnea    Vertigo    Past Surgical History:  Procedure Laterality Date   BIOPSY  04/06/2017   Procedure: BIOPSY;  Surgeon: Rogene Houston, MD;  [facility];  Service: Endoscopy;;  colon   BIOPSY  04/12/2021   Procedure: BIOPSY;  Surgeon: Montez Morita,  Quillian Quince, MD;  Location: [facility];  Service: Gastroenterology;;  nodular area in ascending colon biopsy random colon bx   BIOPSY  02/03/2022   Procedure: BIOPSY;  Surgeon: Leighton Ruff, MD;  [facility]: [facility];  Service: Endoscopy;;   bone spurs     CATARACT EXTRACTION W/PHACO Left 04/22/2018   Procedure: CATARACT EXTRACTION PHACO AND INTRAOCULAR LENS PLACEMENT LEFT EYE;  Surgeon: Tonny Branch, MD;  Location: [facility];  Service: Ophthalmology;  Laterality: Left;  CDE: 7.58   COLONOSCOPY WITH PROPOFOL N/A 04/06/2017   Procedure: COLONOSCOPY WITH PROPOFOL;  Surgeon: Rogene Houston, MD;  [facility];  Service: Endoscopy;  Laterality: N/A;  1:45   COLONOSCOPY WITH PROPOFOL N/A 04/12/2021   Procedure: COLONOSCOPY WITH PROPOFOL;  Surgeon: Harvel Quale, MD;  [facility];  Service: Gastroenterology;  Laterality: N/A;  9:05   FLEXIBLE SIGMOIDOSCOPY N/A 05/10/2021   Procedure: FLEXIBLE SIGMOIDOSCOPY;  Surgeon: Harvel Quale, MD;  [facility];  Service: Gastroenterology;  Laterality: N/A;  9:50 ASA 1 Per Dr C no Anesthesia, no PAT - ok per [place] N/A 02/03/2022   Procedure: FLEXIBLE SIGMOIDOSCOPY;  Surgeon: Leighton Ruff, MD;  Location: [facility];  Service: Endoscopy;  Laterality: N/A;   kneee arthroscopy     both knees   Left shoulder surgery from injury     PORTACATH PLACEMENT Left 08/08/2021   Procedure: INSERTION PORT-A-CATH;  Surgeon: Aviva Signs, MD;  Location: [facility];  Service: General;  Laterality: Left;   Umblical hernia      SOCIAL HISTORY:  Social History   Socioeconomic History   Marital status: Divorced    Spouse name: Not on file   Number of children: Not on file   Years of education: Not on file   Highest education level: Not on file  Occupational History   Not on file  Tobacco Use   Smoking status: Never   Smokeless tobacco: Never  Vaping Use   Vaping Use: Never used  Substance  and Sexual Activity   Alcohol use: No   Drug use: No   Sexual activity: Yes    Birth control/protection: None  Other Topics Concern   Not on file  Social History Narrative   Not on file   Social Determinants of Health   Financial Resource Strain: Low Risk  (05/02/2021)   Overall Financial Resource Strain (CARDIA)    Difficulty of Paying Living Expenses: Not very hard  Food Insecurity: No Food Insecurity (05/02/2021)   Hunger Vital Sign    Worried About Running Out of Food in the Last Year: Never true    [place] in the Last Year: Never true  Transportation Needs: No Transportation Needs (05/02/2021)   PRAPARE - Transportation    Lack of Transportation (Medical): No    Lack of Transportation (Non-Medical): No  Physical Activity: Insufficiently Active (05/02/2021)   Exercise Vital Sign    Days of Exercise per Week: 2 days    Minutes of Exercise per Session: 20 min  Stress: No Stress Concern Present (05/02/2021)   [place]    Feeling of Stress : Not at all  Social Connections: Socially Isolated (05/02/2021)   Social Connection and Isolation Panel [NHANES]    Frequency of Communication with Friends and Family: More than three times a week    Frequency of Social Gatherings with Friends and Family: More than three times a week    Attends Religious Services: Never    Marine scientist or Organizations: No    Attends Archivist Meetings: Never    Marital Status: Divorced  Human resources officer Violence: Not At Risk (05/02/2021)   Humiliation, Afraid, Rape, and Kick questionnaire    Fear of Current or Ex-Partner: No    Emotionally Abused: No    Physically Abused: No    Sexually Abused: No    FAMILY HISTORY:  History reviewed. No pertinent family history.  CURRENT MEDICATIONS:  Current Outpatient Medications  Medication Sig Dispense Refill   alfuzosin (UROXATRAL) 10 MG 24 hr tablet Take 10 mg by  mouth at bedtime. 2200     allopurinol (ZYLOPRIM) 300 MG tablet Take 300 mg by mouth daily.     amLODipine (NORVASC) 5 MG tablet Take 5 mg by mouth daily.     atorvastatin (LIPITOR) 40 MG tablet Take 40 mg by mouth daily.     benazepril (LOTENSIN) 40 MG tablet Take 40 mg by mouth daily.  0   busPIRone (BUSPAR) 15 MG tablet Take 15 mg by mouth 2 (two) times daily.     colestipol (COLESTID) 1 g tablet Take 4 tablets (4 g total) by mouth daily. Take 4 hours apart form your other medications 360 tablet 3   Cyanocobalamin (VITAMIN B-12) 5000 MCG SUBL Place 15,000 mcg under the tongue daily.     diazepam (VALIUM) 10 MG tablet Take  10 mg by mouth at bedtime.      diclofenac (VOLTAREN) 75 MG EC tablet Take 75 mg by mouth 2 (two) times daily.     diphenoxylate-atropine (LOMOTIL) 2.5-0.025 MG tablet Take 1 tablet by mouth 4 (four) times daily as needed for diarrhea or loose stools. 60 tablet 2   DULoxetine (CYMBALTA) 30 MG capsule Take 30 mg by mouth daily as needed.     fluticasone (FLONASE) 50 MCG/ACT nasal spray Place 3-4 sprays into both nostrils daily.     gabapentin (NEURONTIN) 300 MG capsule Take 1 capsule (300 mg total) by mouth 3 (three) times daily. 90 capsule 6   HYDROcodone-acetaminophen (NORCO) 10-325 MG tablet Take 1 tablet by mouth 3 (three) times daily.     hydrOXYzine (ATARAX) 25 MG tablet      KLOR-CON M20 20 MEQ tablet TAKE 1 TABLET BY MOUTH EVERY DAY 30 tablet 3   loperamide (IMODIUM) 2 MG capsule Take 2 mg by mouth as needed. Use on schedule per Dr. Laural Golden.     loratadine (CLARITIN) 10 MG tablet Take 10 mg by mouth daily as needed for allergies (Seasonal).     meclizine (ANTIVERT) 25 MG tablet Take 25 mg by mouth 3 (three) times daily as needed for dizziness (for flare up of inner ear issues.).     Melatonin 10 MG CAPS Take 30 mg by mouth at bedtime.     metoprolol succinate (TOPROL-XL) 100 MG 24 hr tablet Take 100 mg by mouth daily.     naloxone (NARCAN) nasal spray 4 mg/0.1 mL       Naphazoline-Pheniramine (OPCON-A) 0.027-0.315 % SOLN Place 1 drop into both eyes daily as needed (irritation).     Omega-3 Fatty Acids (OMEGA-3 FISH OIL PO) Take 2,160 mg by mouth daily. 720 mg per cap     pregabalin (LYRICA) 75 MG capsule Take by mouth.     sulfaSALAzine (AZULFIDINE) 500 MG tablet sulfasalazine 500 mg tablet  TAKE 2 TABLETS BY MOUTH TWICE DAILY     tizanidine (ZANAFLEX) 2 MG capsule Take 2 mg by mouth 3 (three) times daily as needed for muscle spasms.     traZODone (DESYREL) 50 MG tablet Take 50 mg by mouth at bedtime.     Current Facility-Administered Medications  Medication Dose Route Frequency Provider Last Rate Last Admin   dicyclomine (BENTYL) tablet 20 mg  20 mg Oral TID AC & HS Setzer, Terri L, NP       Facility-Administered Medications Ordered in Other Visits  Medication Dose Route Frequency Provider Last Rate Last Admin   0.9 %  sodium chloride infusion   Intravenous Continuous Derek Jack, MD   Stopped at 10/05/21 1232    ALLERGIES:  No Known Allergies  PHYSICAL EXAM:  Performance status (ECOG): 0 - Asymptomatic  Vitals:   06/19/22 1526  BP: (!) 150/96  Pulse: 64  Resp: 18  Temp: 98.4 F (36.9 C)  SpO2: 96%   Wt Readings from Last 3 Encounters:  06/19/22 271 lb (122.9 kg)  06/14/22 273 lb 12.8 oz (124.2 kg)  02/03/22 280 lb (127 kg)   Physical Exam Vitals reviewed.  Constitutional:      Appearance: Normal appearance. He is obese.  Cardiovascular:     Rate and Rhythm: Normal rate and regular rhythm.     Pulses: Normal pulses.     Heart sounds: Normal heart sounds.  Pulmonary:     Effort: Pulmonary effort is normal.     Breath sounds: Normal breath  sounds.  Neurological:     General: No focal deficit present.     Mental Status: He is alert and oriented to person, place, and time.  Psychiatric:        Mood and Affect: Mood normal.        Behavior: Behavior normal.      LABORATORY DATA:  I have reviewed the labs as listed.      Latest Ref Rng & Units 05/29/2022    9:54 AM 11/30/2021    8:25 AM 11/16/2021    8:44 AM  CBC  WBC 4.0 - 10.5 K/uL 4.5  5.5  5.6   Hemoglobin 13.0 - 17.0 g/dL 12.3  12.0  10.4   Hematocrit 39.0 - 52.0 % 37.6  35.2  31.6   Platelets 150 - 400 K/uL 195  160  132       Latest Ref Rng & Units 06/14/2022   11:35 AM 05/29/2022    9:54 AM 11/30/2021    8:25 AM  CMP  Glucose 70 - 99 mg/dL  139  286   BUN 8 - 23 mg/dL  27  29   Creatinine 0.61 - 1.24 mg/dL  1.03  1.44   Sodium 135 - 145 mmol/L  139  133   Potassium 3.5 - 5.1 mmol/L  3.9  3.2   Chloride 98 - 111 mmol/L  102  93   CO2 22 - 32 mmol/L  31  29   Calcium 8.9 - 10.3 mg/dL  9.5  9.6   Total Protein 6.5 - 8.1 g/dL 7.6  7.1  6.7   Total Bilirubin 0.3 - 1.2 mg/dL 0.8  0.6  0.6   Alkaline Phos 38 - 126 U/L 166  240  84   AST 15 - 41 U/L 61  215  37   ALT 0 - 44 U/L 132  345  38     DIAGNOSTIC IMAGING:  I have independently reviewed the scans and discussed with the patient. MR PELVIS WO CM RECTAL CA STAGING  Result Date: 06/16/2022 CLINICAL DATA:  Post treatment of rectal neoplasm, history of knee of adjuvant therapy. EXAM: MRI PELVIS WITHOUT CONTRAST TECHNIQUE: Multiplanar multisequence MR imaging of the pelvis was performed. No intravenous contrast was administered. COMPARISON:  Most recent comparison imaging is available from December 16, 2021 FINDINGS: Urinary Tract: Urinary bladder under distended. No signs of distal ureteral dilation. Bowel: Generalized diffuse mild bowel wall thickening of the rectum is a stable finding. No discrete rectal lesion is identified. No extension of abnormal signal into the mesorectal fat. Vascular/Lymphatic: No adenopathy or vascular dilation within visualized portions of the pelvis. Reproductive: Heterogeneous prostate is a stable finding with changes of BPH, nonspecific appearance on this study not performed for dedicated prostate assessment. Other:  Mild presacral edema is stable. Musculoskeletal: No  visible bony abnormalities on limited pelvic evaluation. IMPRESSION: 1. Stable appearance of the pelvis with diffuse mild bowel wall thickening of the rectum. No discrete rectal lesion is identified. No extension of abnormal signal into the mesorectal fat. 2. No evidence of adenopathy in the pelvis. Electronically Signed   By: Zetta Bills M.D.   On: 06/16/2022 16:10   CT CHEST ABDOMEN PELVIS W CONTRAST  Result Date: 06/07/2022 CLINICAL DATA:  History of rectal cancer treated with chemotherapy, follow-up. * Tracking Code: BO * EXAM: CT CHEST, ABDOMEN, AND PELVIS WITH CONTRAST TECHNIQUE: Multidetector CT imaging of the chest, abdomen and pelvis was performed following the standard protocol during bolus administration  of intravenous contrast. RADIATION DOSE REDUCTION: This exam was performed according to the departmental dose-optimization program which includes automated exposure control, adjustment of the mA and/or kV according to patient size and/or use of iterative reconstruction technique. CONTRAST:  128m OMNIPAQUE IOHEXOL 300 MG/ML  SOLN COMPARISON:  Multiple priors including most recent MRI December 16, 2021 and CTs December 07, 2021. FINDINGS: CT CHEST FINDINGS Cardiovascular: Aortic atherosclerosis. No central pulmonary embolus on this nondedicated study. Coronary artery calcifications. Normal size heart. No significant pericardial effusion/thickening. Mediastinum/Nodes: No supraclavicular adenopathy. No suspicious thyroid nodule. No pathologically enlarged mediastinal, hilar or axillary lymph nodes. The esophagus is grossly unremarkable. Lungs/Pleura: Azygous lobe/fissure, anatomic variant. Scattered linear bands of atelectasis/scarring. No suspicious pulmonary nodules or masses. No pleural effusion. No pneumothorax. Musculoskeletal: No chest wall mass or suspicious bone lesions identified. CT ABDOMEN PELVIS FINDINGS Hepatobiliary: Hepatic steatosis. No suspicious hepatic lesion. Gallbladder is unremarkable.  No biliary ductal dilation. Pancreas: No pancreatic ductal dilation or evidence of acute inflammation. Spleen: No splenomegaly. Adrenals/Urinary Tract: Bilateral adrenal glands appear normal. No hydronephrosis. Kidneys demonstrate symmetric enhancement and excretion of contrast material. Mild wall thickening of an under distended urinary bladder. Stomach/Bowel: Radiopaque enteric contrast material traverses distal loops of small bowel. Stomach is nondistended limiting evaluation. No pathologic dilation of small or large bowel. The appendix and terminal ileum appear normal. Colonic diverticulosis without findings of acute diverticulitis. Symmetric wall thickening of the rectum without focal discrete abnormality. Vascular/Lymphatic: Aortic atherosclerosis. No pathologically enlarged abdominal or pelvic lymph nodes. Reproductive: Prostate is unremarkable. Other: Perirectal/presacral stranding favored posttreatment change. Musculoskeletal: No aggressive lytic or blastic lesion of bone. Multilevel degenerative changes spine. Probable post radiation change in the sacrum. IMPRESSION: 1. Diffuse symmetric rectal wall thickening without discrete focal lesion identified, may reflect posttreatment change. No convincing evidence of local recurrence. 2. No evidence of metastatic disease in the chest, abdomen or pelvis. 3. Perirectal/presacral stranding without discrete soft tissue nodularity is favored posttreatment change. 4. Mild wall thickening of the urinary bladder may reflect postradiation change. 5. Colonic diverticulosis without findings of acute diverticulitis. 6.  Aortic Atherosclerosis (ICD10-I70.0). Electronically Signed   By: JDahlia BailiffM.D.   On: 06/07/2022 13:19     ASSESSMENT:  Stage III (T2N2) rectal cancer, MSI stable: - He reported diarrhea which started in May.  Prior colonoscopy was in 2018. - Colonoscopy on 04/12/2021 showed fungating polypoid nonobstructing mass with central depression found at 8 cm  from the anal verge.  Mass was noncircumferential, measured 2 cm in length.  Nodular mucosa in the proximal ascending colon which was biopsied. - Pathology of the rectal mass consistent with adenocarcinoma.  Nodular area in the ascending colon was consistent with collagenous colitis.  MMR preserved.  MSI stable. - CT CAP on 05/13/2021 with no evidence of metastatic disease in the chest, abdomen or pelvis.  Mild hepatic steatosis. - Pelvic MRI on 05/12/2021-T2N2 by MRI staging.  4 small lymph nodes 5 mm or greater. - Total neoadjuvant therapy was recommended on discussion with Dr. TMarcello Moores - Xeloda and XRT from 06/08/2021 through 07/15/2021. - FOLFOX 8 cycles from 08/10/2021 through 11/30/2021 with dose reduction of oxaliplatin from cycle 4 onwards.  2.  Social/family history: - He lives at home by himself.  [place] - [place] [place]  Non-smoker. - Sister had lung cancer and was a smoker.  No family history of colon cancer.   PLAN:  Stage III (T2N2) rectal adenocarcinoma, MSI stable: -  CT CAP on 06/06/2022 with no evidence of recurrence. - CEA was 3.1 with elevated LFTs. - Repeat LFTs on 06/14/2022 showed mild improvement but still elevated likely from fatty liver. - I have recommended that he eat plant-based diet and lose weight for control of fatty liver. - We reviewed MRI of the pelvis from 06/15/2022 which showed stable appearance with no discrete rectal lesion and no adenopathy. - We will arrange for follow-up with Dr. Marcello Moores for sigmoidoscopy in November. - RTC 3 months for follow-up with repeat CEA and labs.   2.  Peripheral neuropathy: - Numbness in the feet has been stable.  3.  Diarrhea: - Continue colestipol 4 g daily.  He is not using Lomotil.  4.  Chronic back pain: - Continue hydrocodone daily as needed.  5.  Hypokalemia: - Continue potassium 20 mEq daily.     Orders placed this encounter:  Orders Placed This  Encounter  Procedures   CBC with Differential/Platelet   Comprehensive metabolic panel   CEA      Derek Jack, MD [place] [phone]

## 2022-06-23 NOTE — Progress Notes (Signed)
[place], [place] [address]   CLINIC:  Medical Oncology/Hematology  PCP:  Neale Burly, MD [place] / [place] [id number] [phone]   REASON FOR VISIT:  Follow-up for rectal cancer  PRIOR THERAPY: Capecitabine + XRT  NGS Results: not done  CURRENT THERAPY: FOLFOX q14d x 4 months (started 08/10/21)  BRIEF ONCOLOGIC HISTORY:  Oncology History  Rectal cancer ([place])  05/01/2021 Initial Diagnosis   Rectal adenocarcinoma ([place])   05/30/2021 - 05/30/2021 Chemotherapy   Patient is on Treatment Plan : COLORECTAL Capecitabine + XRT     08/10/2021 - 12/02/2021 Chemotherapy   Patient is on Treatment Plan : COLORECTAL FOLFOX q14d x 4 months       CANCER STAGING:  Cancer Staging  Rectal cancer [facility]) Staging form: Colon and Rectum, AJCC 8th Edition - Clinical stage from 05/18/2021: Stage IIIB (cT2, cN2a, cM0) - Unsigned   INTERVAL HISTORY:  [name], a 66 y.o. male, seen for follow-up of his rectal cancer.  He is taking gabapentin 600 mg 3 times daily for neuropathy.  He is taking colestipol but not Lomotil.  Denies any new onset pains.  Denies any bleeding per rectum or melena.  REVIEW OF SYSTEMS:  Review of Systems  Constitutional:  Negative for appetite change.  Gastrointestinal:  Negative for diarrhea, nausea and vomiting.  Neurological:  Positive for numbness.  All other systems reviewed and are negative.   PAST MEDICAL/SURGICAL HISTORY:  Past Medical History:  Diagnosis Date   Anxiety    Diabetes ([place]) 01/12/2017   Diarrhea 02/21/2017   Essential hypertension, benign 01/12/2017   Gout    High cholesterol 01/12/2017   Neuropathy    Port-A-Cath in place 07/28/2021   Sleep apnea    Vertigo    Past Surgical History:  Procedure Laterality Date   BIOPSY  04/06/2017   Procedure: BIOPSY;  Surgeon: Rogene Houston, MD;  [facility];  Service: Endoscopy;;  colon   BIOPSY  04/12/2021   Procedure:  BIOPSY;  Surgeon: Montez Morita, Quillian Quince, MD;  Location[facility];  Service: Gastroenterology;;  nodular area in ascending colon biopsy random colon bx   BIOPSY  02/03/2022   Procedure: BIOPSY;  Surgeon: Leighton Ruff, MD;  Location: [facility];  Service: Endoscopy;;   bone spurs     CATARACT EXTRACTION W/PHACO Left 04/22/2018   Procedure: CATARACT EXTRACTION PHACO AND INTRAOCULAR LENS PLACEMENT LEFT EYE;  Surgeon: Tonny Branch, MD;  Location: [facility];  Service: Ophthalmology;  Laterality: Left;  CDE: 7.58   COLONOSCOPY WITH PROPOFOL N/A 04/06/2017   Procedure: COLONOSCOPY WITH PROPOFOL;  Surgeon: Rogene Houston, MD;  [facility];  Service: Endoscopy;  Laterality: N/A;  1:45   COLONOSCOPY WITH PROPOFOL N/A 04/12/2021   Procedure: COLONOSCOPY WITH PROPOFOL;  Surgeon: Harvel Quale, MD;  [facility];  Service: Gastroenterology;  Laterality: N/A;  9:05   FLEXIBLE SIGMOIDOSCOPY N/A 05/10/2021   Procedure: FLEXIBLE SIGMOIDOSCOPY;  Surgeon: Harvel Quale, MD;  [facility];  Service: Gastroenterology;  Laterality: N/A;  9:50 ASA 1 Per Dr C no Anesthesia, no PAT - ok per [place] N/A 02/03/2022   Procedure: FLEXIBLE SIGMOIDOSCOPY;  Surgeon: Leighton Ruff, MD;  Location: [facility];  Service: Endoscopy;  Laterality: N/A;   kneee arthroscopy     both knees   Left shoulder surgery from injury     PORTACATH PLACEMENT  Left 08/08/2021   Procedure: INSERTION PORT-A-CATH;  Surgeon: Aviva Signs, MD;  Location: [facility];  Service: General;  Laterality: Left;   Umblical hernia      SOCIAL HISTORY:  Social History   Socioeconomic History   Marital status: Divorced    Spouse name: Not on file   Number of children: Not on file   Years of education: Not on file   Highest education level: Not on file  Occupational History   Not on file  Tobacco Use   Smoking status: Never   Smokeless tobacco: Never  Vaping Use    Vaping Use: Never used  Substance and Sexual Activity   Alcohol use: No   Drug use: No   Sexual activity: Yes    Birth control/protection: None  Other Topics Concern   Not on file  Social History Narrative   Not on file   Social Determinants of Health   Financial Resource Strain: Low Risk  (05/02/2021)   Overall Financial Resource Strain (CARDIA)    Difficulty of Paying Living Expenses: Not very hard  Food Insecurity: No Food Insecurity (05/02/2021)   Hunger Vital Sign    Worried About Running Out of Food in the Last Year: Never true    Ran Out of Food in the Last Year: Never true  Transportation Needs: No Transportation Needs (05/02/2021)   PRAPARE - Transportation    Lack of Transportation (Medical): No    Lack of Transportation (Non-Medical): No  Physical Activity: Insufficiently Active (05/02/2021)   Exercise Vital Sign    Days of Exercise per Week: 2 days    Minutes of Exercise per Session: 20 min  Stress: No Stress Concern Present (05/02/2021)   [place]    Feeling of Stress : Not at all  Social Connections: Socially Isolated (05/02/2021)   Social Connection and Isolation Panel [NHANES]    Frequency of Communication with Friends and Family: More than three times a week    Frequency of Social Gatherings with Friends and Family: More than three times a week    Attends Religious Services: Never    Marine scientist or Organizations: No    Attends Archivist Meetings: Never    Marital Status: Divorced  Human resources officer Violence: Not At Risk (05/02/2021)   Humiliation, Afraid, Rape, and Kick questionnaire    Fear of Current or Ex-Partner: No    Emotionally Abused: No    Physically Abused: No    Sexually Abused: No    FAMILY HISTORY:  No family history on file.  CURRENT MEDICATIONS:  Current Outpatient Medications  Medication Sig Dispense Refill   alfuzosin (UROXATRAL) 10 MG 24 hr tablet  Take 10 mg by mouth at bedtime. 2200     allopurinol (ZYLOPRIM) 300 MG tablet Take 300 mg by mouth daily.     amLODipine (NORVASC) 5 MG tablet Take 5 mg by mouth daily.     atorvastatin (LIPITOR) 40 MG tablet Take 40 mg by mouth daily.     benazepril (LOTENSIN) 40 MG tablet Take 40 mg by mouth daily.  0   busPIRone (BUSPAR) 15 MG tablet Take 15 mg by mouth 2 (two) times daily.     colestipol (COLESTID) 1 g tablet Take 4 tablets (4 g total) by mouth daily. Take 4 hours apart form your other medications 360 tablet 3   Cyanocobalamin (VITAMIN B-12) 5000 MCG SUBL Place 15,000 mcg under the tongue daily.  diazepam (VALIUM) 10 MG tablet Take 10 mg by mouth at bedtime.      diclofenac (VOLTAREN) 75 MG EC tablet Take 75 mg by mouth 2 (two) times daily.     diphenoxylate-atropine (LOMOTIL) 2.5-0.025 MG tablet Take 1 tablet by mouth 4 (four) times daily as needed for diarrhea or loose stools. 60 tablet 2   DULoxetine (CYMBALTA) 30 MG capsule Take 30 mg by mouth daily as needed.     fluticasone (FLONASE) 50 MCG/ACT nasal spray Place 3-4 sprays into both nostrils daily.     gabapentin (NEURONTIN) 300 MG capsule Take 1 capsule (300 mg total) by mouth 3 (three) times daily. 90 capsule 6   HYDROcodone-acetaminophen (NORCO) 10-325 MG tablet Take 1 tablet by mouth 3 (three) times daily.     hydrOXYzine (ATARAX) 25 MG tablet      KLOR-CON M20 20 MEQ tablet TAKE 1 TABLET BY MOUTH EVERY DAY 30 tablet 3   loperamide (IMODIUM) 2 MG capsule Take 2 mg by mouth as needed. Use on schedule per Dr. Laural Golden.     loratadine (CLARITIN) 10 MG tablet Take 10 mg by mouth daily as needed for allergies (Seasonal).     meclizine (ANTIVERT) 25 MG tablet Take 25 mg by mouth 3 (three) times daily as needed for dizziness (for flare up of inner ear issues.).     Melatonin 10 MG CAPS Take 30 mg by mouth at bedtime.     metoprolol succinate (TOPROL-XL) 100 MG 24 hr tablet Take 100 mg by mouth daily.     naloxone (NARCAN) nasal spray 4  mg/0.1 mL      Naphazoline-Pheniramine (OPCON-A) 0.027-0.315 % SOLN Place 1 drop into both eyes daily as needed (irritation).     Omega-3 Fatty Acids (OMEGA-3 FISH OIL PO) Take 2,160 mg by mouth daily. 720 mg per cap     pregabalin (LYRICA) 75 MG capsule Take by mouth.     sulfaSALAzine (AZULFIDINE) 500 MG tablet sulfasalazine 500 mg tablet  TAKE 2 TABLETS BY MOUTH TWICE DAILY     tizanidine (ZANAFLEX) 2 MG capsule Take 2 mg by mouth 3 (three) times daily as needed for muscle spasms.     traZODone (DESYREL) 50 MG tablet Take 50 mg by mouth at bedtime.     Current Facility-Administered Medications  Medication Dose Route Frequency Provider Last Rate Last Admin   dicyclomine (BENTYL) tablet 20 mg  20 mg Oral TID AC & HS Setzer, Terri L, NP       Facility-Administered Medications Ordered in Other Visits  Medication Dose Route Frequency Provider Last Rate Last Admin   0.9 %  sodium chloride infusion   Intravenous Continuous Derek Jack, MD   Stopped at 10/05/21 1232    ALLERGIES:  No Known Allergies  PHYSICAL EXAM:  Performance status (ECOG): 0 - Asymptomatic  Vitals:   06/14/22 1032  BP: (!) 160/96  Pulse: 60  Resp: 16  Temp: 98 F (36.7 C)  SpO2: 99%   Wt Readings from Last 3 Encounters:  06/19/22 271 lb (122.9 kg)  06/14/22 273 lb 12.8 oz (124.2 kg)  02/03/22 280 lb (127 kg)   Physical Exam Vitals reviewed.  Constitutional:      Appearance: Normal appearance. He is obese.  Cardiovascular:     Rate and Rhythm: Normal rate and regular rhythm.     Pulses: Normal pulses.     Heart sounds: Normal heart sounds.  Pulmonary:     Effort: Pulmonary effort is normal.  Breath sounds: Normal breath sounds.  Neurological:     General: No focal deficit present.     Mental Status: He is alert and oriented to person, place, and time.  Psychiatric:        Mood and Affect: Mood normal.        Behavior: Behavior normal.      LABORATORY DATA:  I have reviewed the labs  as listed.     Latest Ref Rng & Units 05/29/2022    9:54 AM 11/30/2021    8:25 AM 11/16/2021    8:44 AM  CBC  WBC 4.0 - 10.5 K/uL 4.5  5.5  5.6   Hemoglobin 13.0 - 17.0 g/dL 12.3  12.0  10.4   Hematocrit 39.0 - 52.0 % 37.6  35.2  31.6   Platelets 150 - 400 K/uL 195  160  132       Latest Ref Rng & Units 06/14/2022   11:35 AM 05/29/2022    9:54 AM 11/30/2021    8:25 AM  CMP  Glucose 70 - 99 mg/dL  139  286   BUN 8 - 23 mg/dL  27  29   Creatinine 0.61 - 1.24 mg/dL  1.03  1.44   Sodium 135 - 145 mmol/L  139  133   Potassium 3.5 - 5.1 mmol/L  3.9  3.2   Chloride 98 - 111 mmol/L  102  93   CO2 22 - 32 mmol/L  31  29   Calcium 8.9 - 10.3 mg/dL  9.5  9.6   Total Protein 6.5 - 8.1 g/dL 7.6  7.1  6.7   Total Bilirubin 0.3 - 1.2 mg/dL 0.8  0.6  0.6   Alkaline Phos 38 - 126 U/L 166  240  84   AST 15 - 41 U/L 61  215  37   ALT 0 - 44 U/L 132  345  38     DIAGNOSTIC IMAGING:  I have independently reviewed the scans and discussed with the patient. MR PELVIS WO CM RECTAL CA STAGING  Result Date: 06/16/2022 CLINICAL DATA:  Post treatment of rectal neoplasm, history of knee of adjuvant therapy. EXAM: MRI PELVIS WITHOUT CONTRAST TECHNIQUE: Multiplanar multisequence MR imaging of the pelvis was performed. No intravenous contrast was administered. COMPARISON:  Most recent comparison imaging is available from December 16, 2021 FINDINGS: Urinary Tract: Urinary bladder under distended. No signs of distal ureteral dilation. Bowel: Generalized diffuse mild bowel wall thickening of the rectum is a stable finding. No discrete rectal lesion is identified. No extension of abnormal signal into the mesorectal fat. Vascular/Lymphatic: No adenopathy or vascular dilation within visualized portions of the pelvis. Reproductive: Heterogeneous prostate is a stable finding with changes of BPH, nonspecific appearance on this study not performed for dedicated prostate assessment. Other:  Mild presacral edema is stable.  Musculoskeletal: No visible bony abnormalities on limited pelvic evaluation. IMPRESSION: 1. Stable appearance of the pelvis with diffuse mild bowel wall thickening of the rectum. No discrete rectal lesion is identified. No extension of abnormal signal into the mesorectal fat. 2. No evidence of adenopathy in the pelvis. Electronically Signed   By: Zetta Bills M.D.   On: 06/16/2022 16:10   CT CHEST ABDOMEN PELVIS W CONTRAST  Result Date: 06/07/2022 CLINICAL DATA:  History of rectal cancer treated with chemotherapy, follow-up. * Tracking Code: BO * EXAM: CT CHEST, ABDOMEN, AND PELVIS WITH CONTRAST TECHNIQUE: Multidetector CT imaging of the chest, abdomen and pelvis was performed following the standard  protocol during bolus administration of intravenous contrast. RADIATION DOSE REDUCTION: This exam was performed according to the departmental dose-optimization program which includes automated exposure control, adjustment of the mA and/or kV according to patient size and/or use of iterative reconstruction technique. CONTRAST:  169m OMNIPAQUE IOHEXOL 300 MG/ML  SOLN COMPARISON:  Multiple priors including most recent MRI December 16, 2021 and CTs December 07, 2021. FINDINGS: CT CHEST FINDINGS Cardiovascular: Aortic atherosclerosis. No central pulmonary embolus on this nondedicated study. Coronary artery calcifications. Normal size heart. No significant pericardial effusion/thickening. Mediastinum/Nodes: No supraclavicular adenopathy. No suspicious thyroid nodule. No pathologically enlarged mediastinal, hilar or axillary lymph nodes. The esophagus is grossly unremarkable. Lungs/Pleura: Azygous lobe/fissure, anatomic variant. Scattered linear bands of atelectasis/scarring. No suspicious pulmonary nodules or masses. No pleural effusion. No pneumothorax. Musculoskeletal: No chest wall mass or suspicious bone lesions identified. CT ABDOMEN PELVIS FINDINGS Hepatobiliary: Hepatic steatosis. No suspicious hepatic lesion.  Gallbladder is unremarkable. No biliary ductal dilation. Pancreas: No pancreatic ductal dilation or evidence of acute inflammation. Spleen: No splenomegaly. Adrenals/Urinary Tract: Bilateral adrenal glands appear normal. No hydronephrosis. Kidneys demonstrate symmetric enhancement and excretion of contrast material. Mild wall thickening of an under distended urinary bladder. Stomach/Bowel: Radiopaque enteric contrast material traverses distal loops of small bowel. Stomach is nondistended limiting evaluation. No pathologic dilation of small or large bowel. The appendix and terminal ileum appear normal. Colonic diverticulosis without findings of acute diverticulitis. Symmetric wall thickening of the rectum without focal discrete abnormality. Vascular/Lymphatic: Aortic atherosclerosis. No pathologically enlarged abdominal or pelvic lymph nodes. Reproductive: Prostate is unremarkable. Other: Perirectal/presacral stranding favored posttreatment change. Musculoskeletal: No aggressive lytic or blastic lesion of bone. Multilevel degenerative changes spine. Probable post radiation change in the sacrum. IMPRESSION: 1. Diffuse symmetric rectal wall thickening without discrete focal lesion identified, may reflect posttreatment change. No convincing evidence of local recurrence. 2. No evidence of metastatic disease in the chest, abdomen or pelvis. 3. Perirectal/presacral stranding without discrete soft tissue nodularity is favored posttreatment change. 4. Mild wall thickening of the urinary bladder may reflect postradiation change. 5. Colonic diverticulosis without findings of acute diverticulitis. 6.  Aortic Atherosclerosis (ICD10-I70.0). Electronically Signed   By: JDahlia BailiffM.D.   On: 06/07/2022 13:19     ASSESSMENT:  Stage III (T2N2) rectal cancer, MSI stable: - He reported diarrhea which started in May.  Prior colonoscopy was in 2018. - Colonoscopy on 04/12/2021 showed fungating polypoid nonobstructing mass with  central depression found at 8 cm from the anal verge.  Mass was noncircumferential, measured 2 cm in length.  Nodular mucosa in the proximal ascending colon which was biopsied. - Pathology of the rectal mass consistent with adenocarcinoma.  Nodular area in the ascending colon was consistent with collagenous colitis.  MMR preserved.  MSI stable. - CT CAP on 05/13/2021 with no evidence of metastatic disease in the chest, abdomen or pelvis.  Mild hepatic steatosis. - Pelvic MRI on 05/12/2021-T2N2 by MRI staging.  4 small lymph nodes 5 mm or greater. - Total neoadjuvant therapy was recommended on discussion with Dr. TMarcello Moores - Xeloda and XRT from 06/08/2021 through 07/15/2021. - FOLFOX 8 cycles from 08/10/2021 through 11/30/2021 with dose reduction of oxaliplatin from cycle 4 onwards.  2.  Social/family history: - He lives at home by himself.  [place] - [place]  Non-smoker. - Sister had lung cancer and was a smoker.  No family history of colon cancer.   PLAN:  Stage III (T2N2)  rectal adenocarcinoma, MSI stable: -He is on close observation without surgery. - He does not report any signs of recurrence. - We have reviewed MRI of the pelvis which showed stable appearance with no new lesions.  No evidence of adenopathy. - He will be referred back to Dr. Marcello Moores for sigmoidoscopy. - He has an appointment to see Dr. Jenetta Downer on 08/21/2022. - Recommend follow-up with me in January.   2.  Peripheral neuropathy: - He has a neuropathy pains in the feet. - Continue gabapentin 600 mg 3 times daily which is helping.   3.  Diarrhea: - Continue colestipol 4 g daily.  Diarrhea under control.  4.  Chronic back pain: - He is not requiring any pain medication.  5.  Hypokalemia: - Continue potassium 20 mEq daily.     Orders placed this encounter:  Orders Placed This Encounter  Procedures   MR PELVIS WO CM RECTAL CA STAGING   MR  PELVIS WO CM RECTAL CA STAGING   Hepatic function panel      Derek Jack, MD [place] [phone]

## 2022-06-29 ENCOUNTER — Other Ambulatory Visit: Payer: Self-pay | Admitting: General Surgery

## 2022-06-29 DIAGNOSIS — C20 Malignant neoplasm of rectum: Secondary | ICD-10-CM

## 2022-07-23 DIAGNOSIS — R0683 Snoring: Secondary | ICD-10-CM | POA: Diagnosis not present

## 2022-07-23 DIAGNOSIS — R0681 Apnea, not elsewhere classified: Secondary | ICD-10-CM | POA: Diagnosis not present

## 2022-07-24 DIAGNOSIS — C20 Malignant neoplasm of rectum: Secondary | ICD-10-CM | POA: Diagnosis not present

## 2022-07-25 DIAGNOSIS — G4733 Obstructive sleep apnea (adult) (pediatric): Secondary | ICD-10-CM | POA: Diagnosis not present

## 2022-08-10 DIAGNOSIS — Z23 Encounter for immunization: Secondary | ICD-10-CM | POA: Diagnosis not present

## 2022-08-16 DIAGNOSIS — Z79891 Long term (current) use of opiate analgesic: Secondary | ICD-10-CM | POA: Diagnosis not present

## 2022-08-16 DIAGNOSIS — M545 Low back pain, unspecified: Secondary | ICD-10-CM | POA: Diagnosis not present

## 2022-08-16 DIAGNOSIS — R252 Cramp and spasm: Secondary | ICD-10-CM | POA: Diagnosis not present

## 2022-08-16 DIAGNOSIS — M5416 Radiculopathy, lumbar region: Secondary | ICD-10-CM | POA: Diagnosis not present

## 2022-08-16 DIAGNOSIS — G4733 Obstructive sleep apnea (adult) (pediatric): Secondary | ICD-10-CM | POA: Diagnosis not present

## 2022-08-16 DIAGNOSIS — G620 Drug-induced polyneuropathy: Secondary | ICD-10-CM | POA: Diagnosis not present

## 2022-08-21 ENCOUNTER — Encounter (INDEPENDENT_AMBULATORY_CARE_PROVIDER_SITE_OTHER): Payer: Self-pay | Admitting: Gastroenterology

## 2022-08-21 ENCOUNTER — Ambulatory Visit (INDEPENDENT_AMBULATORY_CARE_PROVIDER_SITE_OTHER): Payer: Medicare Other | Admitting: Gastroenterology

## 2022-08-21 ENCOUNTER — Other Ambulatory Visit (INDEPENDENT_AMBULATORY_CARE_PROVIDER_SITE_OTHER): Payer: Self-pay | Admitting: Gastroenterology

## 2022-08-21 VITALS — BP 174/110 | HR 71 | Temp 98.4°F | Ht 72.0 in | Wt 271.5 lb

## 2022-08-21 DIAGNOSIS — C20 Malignant neoplasm of rectum: Secondary | ICD-10-CM | POA: Diagnosis not present

## 2022-08-21 DIAGNOSIS — R7989 Other specified abnormal findings of blood chemistry: Secondary | ICD-10-CM | POA: Diagnosis not present

## 2022-08-21 DIAGNOSIS — K7581 Nonalcoholic steatohepatitis (NASH): Secondary | ICD-10-CM

## 2022-08-21 DIAGNOSIS — K52831 Collagenous colitis: Secondary | ICD-10-CM | POA: Diagnosis not present

## 2022-08-21 NOTE — Patient Instructions (Addendum)
Continue colestipol 4 g qday Follow up with Dr. Delton Coombes and Marcello Moores per oncology protocol Proceed with repeat CMP (ordered by Dr. Delton Coombes). If worsening liver enzymes, will need to work on other testing to rule out other medical reasons causing elevated LFTs The patient was found to have elevated blood pressure when vital signs were checked in the office. The blood pressure was rechecked by the nursing staff and it was found be persistently elevated >140/90 mmHg. I personally advised to the patient to follow up closely with his PCP for hypertension control.

## 2022-08-21 NOTE — Progress Notes (Signed)
Louis House, M.D. Gastroenterology & Hepatology [facility] [address] [place], [place] [address]  Primary Care Physician: Neale Burly, MD [place] [address]  I will communicate my assessment and recommendations to the referring MD via EMR.  Problems: Stage III (T2N2) rectal adenocarcinoma status post capecitabine with XRT, now on FOLFOX Collagenous colitis on colestipol  History of Present Illness: Louis House is a 66 y.o. male with past medical history of rectal adenocarcinoma - Stage IIIB (cT2, cN2a, cM0), collagenous colitis, diabetes, hypertension, gout, hyperlipidemia and anxiety , who presents for follow up of rectal cancer and collagenous colitis.   The patient was last seen on 12/12/2021. At that time, the patient was advised to continue colestipol 4 g every day.  Patient reports that he has had episodes of urgency to have a bowel movement and he has had some accidents if he does not go to the restroom in a short time span. Louis House, he denies any recent fecal soiling. He reports that overall his stools are formed and with same color, he describes as long strings. No melena or hematochezia. The patient denies having any nausea, vomiting, fever, chills, hematochezia, melena, hematemesis, abdominal distention, abdominal pain, diarrhea, jaundice, pruritus. Has lost some weight after changing his diet.  The patient has been seen by Dr. Delton Coombes and Dr. Marcello Moores.  Plan by colorectal surgery is to undergo a repeat proctoscopy in February for follow-up, office visits every 3 months and contrast MRI every 6 months for 2 years.  If there is any recurrence of his cancer, he will need to undergo a low anterior resection and diverting ileostomy. Last CEA 05/29/2022 was negative 3.1. Last MRI on 06/15/2022 - no evidence of adenopathy or rectal masses. He has developed some neuropathy with chemotherapy.  Notably, the  patient had elevation of his LFTs on 05/29/2022 with alkaline phosphatase of 240, AST 250, ALT 345, total bili was normal 0.6.  Repeat LFTs on 06/14/2022 showed improvement with alkaline phosphatase 166, AST 61, ALT 132.  Last Colonoscopy:  04/12/2021 that showed presence of a fungating mass at 8 cm from the anus which was consistent with a moderate to poorly differentiated adenocarcinoma.  Random colonic biopsies were also consistent with collagenous colitis.    Past Medical History: Past Medical History:  Diagnosis Date   Anxiety    Diabetes ([place]) 01/12/2017   Diarrhea 02/21/2017   Essential hypertension, benign 01/12/2017   Gout    High cholesterol 01/12/2017   Neuropathy    Port-A-Cath in place 07/28/2021   Sleep apnea    Vertigo     Past Surgical History: Past Surgical History:  Procedure Laterality Date   BIOPSY  04/06/2017   Procedure: BIOPSY;  Surgeon: Rogene Houston, MD;  [facility];  Service: Endoscopy;;  colon   BIOPSY  04/12/2021   Procedure: BIOPSY;  Surgeon: Montez Morita, Quillian Quince, MD;  [facility];  Service: Gastroenterology;;  nodular area in ascending colon biopsy random colon bx   BIOPSY  02/03/2022   Procedure: BIOPSY;  Surgeon: Leighton Ruff, MD;  [facility];  Service: Endoscopy;;   bone spurs     CATARACT EXTRACTION W/PHACO Left 04/22/2018   Procedure: CATARACT EXTRACTION PHACO AND INTRAOCULAR LENS PLACEMENT LEFT EYE;  Surgeon: Tonny Branch, MD;  [facility]: [facility];  Service: Ophthalmology;  Laterality: Left;  CDE: 7.58   COLONOSCOPY WITH PROPOFOL N/A 04/06/2017   Procedure: COLONOSCOPY WITH PROPOFOL;  Surgeon:  Rogene Houston, MD;  [facility];  Service: Endoscopy;  Laterality: N/A;  1:45   COLONOSCOPY WITH PROPOFOL N/A 04/12/2021   Procedure: COLONOSCOPY WITH PROPOFOL;  Surgeon: Harvel Quale, MD;  [facility];  Service: Gastroenterology;  Laterality: N/A;  9:05   FLEXIBLE SIGMOIDOSCOPY N/A 05/10/2021    Procedure: FLEXIBLE SIGMOIDOSCOPY;  Surgeon: Harvel Quale, MD;  [facility];  Service: Gastroenterology;  Laterality: N/A;  9:50 ASA 1 Per Dr C no Anesthesia, no PAT - ok per Moquino N/A 02/03/2022   Procedure: FLEXIBLE SIGMOIDOSCOPY;  Surgeon: Leighton Ruff, MD;  [facility];  Service: Endoscopy;  Laterality: N/A;   kneee arthroscopy     both knees   Left shoulder surgery from injury     PORTACATH PLACEMENT Left 08/08/2021   Procedure: INSERTION PORT-A-CATH;  Surgeon: Aviva Signs, MD;  [facility]: [facility];  Service: General;  Laterality: Left;   Umblical hernia      Family History:History reviewed. No pertinent family history.  Social History: Social History   Tobacco Use  Smoking Status Never  Smokeless Tobacco Never   Social History   Substance and Sexual Activity  Alcohol Use No   Social History   Substance and Sexual Activity  Drug Use No    Allergies: No Known Allergies  Medications: Current Outpatient Medications  Medication Sig Dispense Refill   alfuzosin (UROXATRAL) 10 MG 24 hr tablet Take 10 mg by mouth at bedtime. 2200     allopurinol (ZYLOPRIM) 300 MG tablet Take 300 mg by mouth daily.     amLODipine (NORVASC) 5 MG tablet Take 5 mg by mouth daily.     atorvastatin (LIPITOR) 40 MG tablet Take 40 mg by mouth daily.     benazepril (LOTENSIN) 40 MG tablet Take 40 mg by mouth daily.  0   busPIRone (BUSPAR) 15 MG tablet Take 15 mg by mouth 2 (two) times daily.     colestipol (COLESTID) 1 g tablet Take 4 tablets (4 g total) by mouth daily. Take 4 hours apart form your other medications 360 tablet 3   Cyanocobalamin (VITAMIN B-12) 5000 MCG SUBL Place 15,000 mcg under the tongue daily.     diazepam (VALIUM) 10 MG tablet Take 10 mg by mouth at bedtime.      diclofenac (VOLTAREN) 75 MG EC tablet Take 75 mg by mouth 2 (two) times daily.     DULoxetine (CYMBALTA) 30 MG capsule Take 30 mg by mouth daily as  needed.     fluticasone (FLONASE) 50 MCG/ACT nasal spray Place 3-4 sprays into both nostrils daily.     gabapentin (NEURONTIN) 300 MG capsule Take 1 capsule (300 mg total) by mouth 3 (three) times daily. (Patient taking differently: Take 600 mg by mouth 3 (three) times daily.) 90 capsule 6   HYDROcodone-acetaminophen (NORCO) 10-325 MG tablet Take 1 tablet by mouth 3 (three) times daily.     meclizine (ANTIVERT) 25 MG tablet Take 25 mg by mouth 3 (three) times daily as needed for dizziness (for flare up of inner ear issues.).     metoprolol succinate (TOPROL-XL) 100 MG 24 hr tablet Take 100 mg by mouth daily.     Naphazoline-Pheniramine (OPCON-A) 0.027-0.315 % SOLN Place 1 drop into both eyes daily as needed (irritation).     Omega-3 Fatty Acids (OMEGA-3 FISH OIL PO) Take 2,160 mg by mouth daily. 720 mg per cap     dicyclomine (BENTYL) 20 MG tablet  Take 20 mg by mouth 3 (three) times daily before meals.     diphenoxylate-atropine (LOMOTIL) 2.5-0.025 MG tablet Take 1 tablet by mouth 4 (four) times daily as needed for diarrhea or loose stools. (Patient not taking: Reported on 08/21/2022) 60 tablet 2   hydrOXYzine (ATARAX) 25 MG tablet      KLOR-CON M20 20 MEQ tablet TAKE 1 TABLET BY MOUTH EVERY DAY (Patient not taking: Reported on 08/21/2022) 30 tablet 3   loratadine (CLARITIN) 10 MG tablet Take 10 mg by mouth daily as needed for allergies (Seasonal). (Patient not taking: Reported on 08/21/2022)     naloxone [facility]) nasal spray 4 mg/0.1 mL  (Patient not taking: Reported on 08/21/2022)     Current Facility-Administered Medications  Medication Dose Route Frequency Provider Last Rate Last Admin   dicyclomine (BENTYL) tablet 20 mg  20 mg Oral TID AC & HS Setzer, Terri L, NP       Facility-Administered Medications Ordered in Other Visits  Medication Dose Route Frequency Provider Last Rate Last Admin   0.9 %  sodium chloride infusion   Intravenous Continuous Derek Jack, MD   Stopped at  10/05/21 1232    Review of Systems: GENERAL: negative for malaise, night sweats HEENT: No changes in hearing or vision, no nose bleeds or other nasal problems. NECK: Negative for lumps, goiter, pain and significant neck swelling RESPIRATORY: Negative for cough, wheezing CARDIOVASCULAR: Negative for chest pain, leg swelling, palpitations, orthopnea GI: SEE HPI MUSCULOSKELETAL: Negative for joint pain or swelling, back pain, and muscle pain. SKIN: Negative for lesions, rash PSYCH: Negative for sleep disturbance, mood disorder and recent psychosocial stressors. HEMATOLOGY Negative for prolonged bleeding, bruising easily, and swollen nodes. ENDOCRINE: Negative for cold or heat intolerance, polyuria, polydipsia and goiter. NEURO: negative for tremor, gait imbalance, syncope and seizures. The remainder of the review of systems is noncontributory.   Physical Exam: BP (!) 174/110 (BP Location: Left Arm, Patient Position: Sitting, Cuff Size: Large)   Pulse 71   Temp 98.4 F (36.9 C) (Temporal)   Ht 6' (1.829 m)   Wt 271 lb 8 oz (123.2 kg)   BMI 36.82 kg/m  GENERAL: The patient is AO x3, in no acute distress. Obese  HEENT: Head is normocephalic and atraumatic. EOMI are intact. Mouth is well hydrated and without lesions. NECK: Supple. No masses LUNGS: Clear to auscultation. No presence of rhonchi/wheezing/rales. Adequate chest expansion HEART: RRR, normal s1 and s2. ABDOMEN: Soft, nontender, no guarding, no peritoneal signs, and nondistended. BS +. No masses EXTREMITIES: Without any cyanosis, clubbing, rash, lesions or edema. NEUROLOGIC: AOx3, no focal motor deficit. SKIN: no jaundice, no rashes  Imaging/Labs: as above  I personally reviewed and interpreted the available labs, imaging and endoscopic files.  Impression and Plan: Louis House is a 66 y.o. male with past medical history of rectal adenocarcinoma - Stage IIIB (cT2, cN2a, cM0), collagenous colitis, diabetes,  hypertension, gout, hyperlipidemia and anxiety , who presents for follow up of rectal cancer and collagenous colitis.  The patient presented significant improvement of his diarrhea while on colestipol, which he should continue taking for now at the same dosage.  Regarding his rectal cancer, he did not undergo surgical resection and is currently on active surveillance by colorectal surgery and oncology.  Patient should follow surveillance with the plan described above.  Finally, he had some moderate elevation of his aminotransferases which improved a month afterwards.  I had the patient to repeat a CMP as ordered by Dr. Delton Coombes  and if these enzymes are persistently elevated, we will need to perform further testing.  It is possible that he is elevation was related to medication use and concomitant fatty liver.  He is currently working on weight loss.  The patient was found to have elevated blood pressure when vital signs were checked in the office. The blood pressure was rechecked by the nursing staff and it was found be persistently elevated >140/90 mmHg. I personally advised to the patient to follow up closely with his PCP for hypertension control.  - Continue colestipol 4 g qday - Follow up with Dr. Delton Coombes and Marcello Moores per oncology protocol - Proceed with repeat CMP (ordered by Dr. Delton Coombes). If worsening liver enzymes, will need to work on other testing to rule out other medical reasons causing elevated LFTs  All questions were answered.      Louis Peppers, MD Gastroenterology and Hepatology [facility]

## 2022-08-21 NOTE — Telephone Encounter (Signed)
Has appt today

## 2022-08-24 DIAGNOSIS — Z23 Encounter for immunization: Secondary | ICD-10-CM | POA: Diagnosis not present

## 2022-08-24 DIAGNOSIS — M1049 Other secondary gout, multiple sites: Secondary | ICD-10-CM | POA: Diagnosis not present

## 2022-08-24 DIAGNOSIS — N401 Enlarged prostate with lower urinary tract symptoms: Secondary | ICD-10-CM | POA: Diagnosis not present

## 2022-08-24 DIAGNOSIS — I10 Essential (primary) hypertension: Secondary | ICD-10-CM | POA: Diagnosis not present

## 2022-08-24 DIAGNOSIS — G609 Hereditary and idiopathic neuropathy, unspecified: Secondary | ICD-10-CM | POA: Diagnosis not present

## 2022-08-24 DIAGNOSIS — H8113 Benign paroxysmal vertigo, bilateral: Secondary | ICD-10-CM | POA: Diagnosis not present

## 2022-08-24 DIAGNOSIS — N182 Chronic kidney disease, stage 2 (mild): Secondary | ICD-10-CM | POA: Diagnosis not present

## 2022-08-24 DIAGNOSIS — E11610 Type 2 diabetes mellitus with diabetic neuropathic arthropathy: Secondary | ICD-10-CM | POA: Diagnosis not present

## 2022-09-12 ENCOUNTER — Inpatient Hospital Stay: Payer: Medicare Other

## 2022-09-14 ENCOUNTER — Inpatient Hospital Stay: Payer: Medicare Other | Attending: Hematology

## 2022-09-14 DIAGNOSIS — G8929 Other chronic pain: Secondary | ICD-10-CM | POA: Insufficient documentation

## 2022-09-14 DIAGNOSIS — E119 Type 2 diabetes mellitus without complications: Secondary | ICD-10-CM | POA: Insufficient documentation

## 2022-09-14 DIAGNOSIS — M549 Dorsalgia, unspecified: Secondary | ICD-10-CM | POA: Insufficient documentation

## 2022-09-14 DIAGNOSIS — G629 Polyneuropathy, unspecified: Secondary | ICD-10-CM | POA: Diagnosis not present

## 2022-09-14 DIAGNOSIS — R748 Abnormal levels of other serum enzymes: Secondary | ICD-10-CM

## 2022-09-14 DIAGNOSIS — I10 Essential (primary) hypertension: Secondary | ICD-10-CM | POA: Insufficient documentation

## 2022-09-14 DIAGNOSIS — Z79899 Other long term (current) drug therapy: Secondary | ICD-10-CM | POA: Insufficient documentation

## 2022-09-14 DIAGNOSIS — Z85048 Personal history of other malignant neoplasm of rectum, rectosigmoid junction, and anus: Secondary | ICD-10-CM | POA: Diagnosis not present

## 2022-09-14 DIAGNOSIS — K52831 Collagenous colitis: Secondary | ICD-10-CM | POA: Insufficient documentation

## 2022-09-14 DIAGNOSIS — C20 Malignant neoplasm of rectum: Secondary | ICD-10-CM

## 2022-09-14 LAB — COMPREHENSIVE METABOLIC PANEL
ALT: 47 U/L — ABNORMAL HIGH (ref 0–44)
AST: 32 U/L (ref 15–41)
Albumin: 4.3 g/dL (ref 3.5–5.0)
Alkaline Phosphatase: 114 U/L (ref 38–126)
Anion gap: 8 (ref 5–15)
BUN: 27 mg/dL — ABNORMAL HIGH (ref 8–23)
CO2: 27 mmol/L (ref 22–32)
Calcium: 9.3 mg/dL (ref 8.9–10.3)
Chloride: 101 mmol/L (ref 98–111)
Creatinine, Ser: 1.04 mg/dL (ref 0.61–1.24)
GFR, Estimated: >60 mL/min (ref >60)
Glucose, Bld: 142 mg/dL — ABNORMAL HIGH (ref 70–99)
Potassium: 3.7 mmol/L (ref 3.5–5.1)
Sodium: 136 mmol/L (ref 135–145)
Total Bilirubin: 0.8 mg/dL (ref 0.3–1.2)
Total Protein: 7.2 g/dL (ref 6.5–8.1)

## 2022-09-14 LAB — CBC WITH DIFFERENTIAL/PLATELET
Abs Immature Granulocytes: 0.05 10*3/uL (ref 0.00–0.07)
Basophils Absolute: 0.1 10*3/uL (ref 0.0–0.1)
Basophils Relative: 1 %
Eosinophils Absolute: 0.4 10*3/uL (ref 0.0–0.5)
Eosinophils Relative: 5 %
HCT: 40.7 % (ref 39.0–52.0)
Hemoglobin: 13.8 g/dL (ref 13.0–17.0)
Immature Granulocytes: 1 %
Lymphocytes Relative: 16 %
Lymphs Abs: 1.1 10*3/uL (ref 0.7–4.0)
MCH: 30.7 pg (ref 26.0–34.0)
MCHC: 33.9 g/dL (ref 30.0–36.0)
MCV: 90.6 fL (ref 80.0–100.0)
Monocytes Absolute: 0.6 10*3/uL (ref 0.1–1.0)
Monocytes Relative: 8 %
Neutro Abs: 5 10*3/uL (ref 1.7–7.7)
Neutrophils Relative %: 69 %
Platelets: 242 10*3/uL (ref 150–400)
RBC: 4.49 MIL/uL (ref 4.22–5.81)
RDW: 12.6 % (ref 11.5–15.5)
WBC: 7.2 10*3/uL (ref 4.0–10.5)
nRBC: 0 % (ref 0.0–0.2)

## 2022-09-16 LAB — CEA: CEA: 2.9 ng/mL (ref 0.0–4.7)

## 2022-09-19 ENCOUNTER — Inpatient Hospital Stay (HOSPITAL_BASED_OUTPATIENT_CLINIC_OR_DEPARTMENT_OTHER): Payer: Medicare Other | Admitting: Hematology

## 2022-09-19 VITALS — BP 163/91 | HR 61 | Temp 98.4°F | Resp 16 | Wt 269.8 lb

## 2022-09-19 DIAGNOSIS — E119 Type 2 diabetes mellitus without complications: Secondary | ICD-10-CM | POA: Diagnosis not present

## 2022-09-19 DIAGNOSIS — Z85048 Personal history of other malignant neoplasm of rectum, rectosigmoid junction, and anus: Secondary | ICD-10-CM | POA: Diagnosis not present

## 2022-09-19 DIAGNOSIS — M549 Dorsalgia, unspecified: Secondary | ICD-10-CM | POA: Diagnosis not present

## 2022-09-19 DIAGNOSIS — G629 Polyneuropathy, unspecified: Secondary | ICD-10-CM | POA: Diagnosis not present

## 2022-09-19 DIAGNOSIS — G8929 Other chronic pain: Secondary | ICD-10-CM | POA: Diagnosis not present

## 2022-09-19 DIAGNOSIS — C20 Malignant neoplasm of rectum: Secondary | ICD-10-CM | POA: Diagnosis not present

## 2022-09-19 DIAGNOSIS — K52831 Collagenous colitis: Secondary | ICD-10-CM | POA: Diagnosis not present

## 2022-09-19 NOTE — Progress Notes (Signed)
[place], [place] [address]   CLINIC:  Medical Oncology/Hematology  PCP:  Neale Burly, MD [place] / [place] [address] [phone]   REASON FOR VISIT:  Follow-up for rectal cancer  PRIOR THERAPY: Capecitabine + XRT  NGS Results: not done  CURRENT THERAPY: FOLFOX q14d x 4 months (started 08/10/21)  BRIEF ONCOLOGIC HISTORY:  Oncology History  Rectal cancer ([place])  05/01/2021 Initial Diagnosis   Rectal adenocarcinoma ([place])   05/30/2021 - 05/30/2021 Chemotherapy   Patient is on Treatment Plan : COLORECTAL Capecitabine + XRT     08/10/2021 - 12/02/2021 Chemotherapy   Patient is on Treatment Plan : COLORECTAL FOLFOX q14d x 4 months       CANCER STAGING:  Cancer Staging  Rectal cancer [facility]) Staging form: Colon and Rectum, AJCC 8th Edition - Clinical stage from 05/18/2021: Stage IIIB (cT2, cN2a, cM0) - Unsigned   INTERVAL HISTORY:  Louis House, a 67 y.o. male, seen for follow-up of rectal cancer.  Does not report any change in bowel habits.  Denies any bleeding per rectum or melena.  Reports energy levels 100%.  Numbness in the hands and feet has been stable.  Denies any new onset pains.  Reportedly changed to majority plant-based diet since last visit.  REVIEW OF SYSTEMS:  Review of Systems  Constitutional:  Negative for appetite change.  Gastrointestinal:  Negative for diarrhea, nausea and vomiting.  Neurological:  Positive for numbness. Negative for dizziness.  All other systems reviewed and are negative.   PAST MEDICAL/SURGICAL HISTORY:  Past Medical History:  Diagnosis Date   Anxiety    Diabetes ([place]) 01/12/2017   Diarrhea 02/21/2017   Essential hypertension, benign 01/12/2017   Gout    High cholesterol 01/12/2017   Neuropathy    Port-A-Cath in place 07/28/2021   Sleep apnea    Vertigo    Past Surgical History:  Procedure Laterality Date   BIOPSY  04/06/2017   Procedure: BIOPSY;  Surgeon: Rogene Houston, MD;  [facility];  Service: Endoscopy;;  colon   BIOPSY  04/12/2021   Procedure: BIOPSY;  Surgeon: Montez Morita, Quillian Quince, MD;  [facility];  Service: Gastroenterology;;  nodular area in ascending colon biopsy random colon bx   BIOPSY  02/03/2022   Procedure: BIOPSY;  Surgeon: Leighton Ruff, MD;  [facility];  Service: Endoscopy;;   bone spurs     CATARACT EXTRACTION W/PHACO Left 04/22/2018   Procedure: CATARACT EXTRACTION PHACO AND INTRAOCULAR LENS PLACEMENT LEFT EYE;  Surgeon: Tonny Branch, MD;  [facility];  Service: Ophthalmology;  Laterality: Left;  CDE: 7.58   COLONOSCOPY WITH PROPOFOL N/A 04/06/2017   Procedure: COLONOSCOPY WITH PROPOFOL;  Surgeon: Rogene Houston, MD;  [facility];  Service: Endoscopy;  Laterality: N/A;  1:45   COLONOSCOPY WITH PROPOFOL N/A 04/12/2021   Procedure: COLONOSCOPY WITH PROPOFOL;  Surgeon: Harvel Quale, MD;  [facility];  Service: Gastroenterology;  Laterality: N/A;  9:05   FLEXIBLE SIGMOIDOSCOPY N/A 05/10/2021   Procedure: FLEXIBLE SIGMOIDOSCOPY;  Surgeon: Harvel Quale, MD;  [facility];  Service: Gastroenterology;  Laterality: N/A;  9:50 ASA 1 Per Dr C no Anesthesia, no PAT - ok per Barrera N/A 02/03/2022   Procedure: FLEXIBLE SIGMOIDOSCOPY;  Surgeon: Leighton Ruff, MD;  [facility];  Service: Endoscopy;  Laterality: N/A;   kneee arthroscopy  both knees   Left shoulder surgery from injury     PORTACATH PLACEMENT Left 08/08/2021   Procedure: INSERTION PORT-A-CATH;  Surgeon: Aviva Signs, MD;  Location: [facility];  Service: General;  Laterality: Left;   Umblical hernia      SOCIAL HISTORY:  Social History   Socioeconomic History   Marital status: Divorced    Spouse name: Not on file   Number of children: Not on file   Years of education: Not on file   Highest education level: Not on file  Occupational  History   Not on file  Tobacco Use   Smoking status: Never   Smokeless tobacco: Never  Vaping Use   Vaping Use: Never used  Substance and Sexual Activity   Alcohol use: No   Drug use: No   Sexual activity: Yes    Birth control/protection: None  Other Topics Concern   Not on file  Social History Narrative   Not on file   Social Determinants of Health   Financial Resource Strain: Low Risk  (05/02/2021)   Overall Financial Resource Strain (CARDIA)    Difficulty of Paying Living Expenses: Not very hard  Food Insecurity: No Food Insecurity (05/02/2021)   Hunger Vital Sign    Worried About Running Out of Food in the Last Year: Never true    Ran Out of Food in the Last Year: Never true  Transportation Needs: No Transportation Needs (05/02/2021)   PRAPARE - Transportation    Lack of Transportation (Medical): No    Lack of Transportation (Non-Medical): No  Physical Activity: Insufficiently Active (05/02/2021)   Exercise Vital Sign    Days of Exercise per Week: 2 days    Minutes of Exercise per Session: 20 min  Stress: No Stress Concern Present (05/02/2021)   [place]    Feeling of Stress : Not at all  Social Connections: Socially Isolated (05/02/2021)   Social Connection and Isolation Panel [NHANES]    Frequency of Communication with Friends and Family: More than three times a week    Frequency of Social Gatherings with Friends and Family: More than three times a week    Attends Religious Services: Never    Marine scientist or Organizations: No    Attends Archivist Meetings: Never    Marital Status: Divorced  Human resources officer Violence: Not At Risk (05/02/2021)   Humiliation, Afraid, Rape, and Kick questionnaire    Fear of Current or Ex-Partner: No    Emotionally Abused: No    Physically Abused: No    Sexually Abused: No    FAMILY HISTORY:  No family history on file.  CURRENT MEDICATIONS:   Current Outpatient Medications  Medication Sig Dispense Refill   alfuzosin (UROXATRAL) 10 MG 24 hr tablet Take 10 mg by mouth at bedtime. 2200     allopurinol (ZYLOPRIM) 300 MG tablet Take 300 mg by mouth daily.     amLODipine (NORVASC) 5 MG tablet Take 5 mg by mouth daily.     atorvastatin (LIPITOR) 40 MG tablet Take 40 mg by mouth daily.     benazepril (LOTENSIN) 40 MG tablet Take 40 mg by mouth daily.  0   busPIRone (BUSPAR) 15 MG tablet Take 15 mg by mouth 2 (two) times daily.     colestipol (COLESTID) 1 g tablet TAKE 4 TABLETS (4 G TOTAL) BY MOUTH DAILY. TAKE 4 HOURS APART FORM YOUR OTHER MEDICATIONS 360 tablet 3   Cyanocobalamin (  VITAMIN B-12) 5000 MCG SUBL Place 15,000 mcg under the tongue daily.     diazepam (VALIUM) 10 MG tablet Take 10 mg by mouth at bedtime.      diclofenac (VOLTAREN) 75 MG EC tablet Take 75 mg by mouth 2 (two) times daily.     dicyclomine (BENTYL) 20 MG tablet Take 20 mg by mouth 3 (three) times daily before meals.     diphenoxylate-atropine (LOMOTIL) 2.5-0.025 MG tablet Take 1 tablet by mouth 4 (four) times daily as needed for diarrhea or loose stools. (Patient not taking: Reported on 08/21/2022) 60 tablet 2   DULoxetine (CYMBALTA) 30 MG capsule Take 30 mg by mouth daily as needed.     fluticasone (FLONASE) 50 MCG/ACT nasal spray Place 3-4 sprays into both nostrils daily.     gabapentin (NEURONTIN) 300 MG capsule Take 1 capsule (300 mg total) by mouth 3 (three) times daily. (Patient taking differently: Take 600 mg by mouth 3 (three) times daily.) 90 capsule 6   HYDROcodone-acetaminophen (NORCO) 10-325 MG tablet Take 1 tablet by mouth 3 (three) times daily.     hydrOXYzine (ATARAX) 25 MG tablet      KLOR-CON M20 20 MEQ tablet TAKE 1 TABLET BY MOUTH EVERY DAY (Patient not taking: Reported on 08/21/2022) 30 tablet 3   loratadine (CLARITIN) 10 MG tablet Take 10 mg by mouth daily as needed for allergies (Seasonal). (Patient not taking: Reported on 08/21/2022)      meclizine (ANTIVERT) 25 MG tablet Take 25 mg by mouth 3 (three) times daily as needed for dizziness (for flare up of inner ear issues.).     metoprolol succinate (TOPROL-XL) 100 MG 24 hr tablet Take 100 mg by mouth daily.     naloxone (NARCAN) nasal spray 4 mg/0.1 mL  (Patient not taking: Reported on 08/21/2022)     Naphazoline-Pheniramine (OPCON-A) 0.027-0.315 % SOLN Place 1 drop into both eyes daily as needed (irritation).     Omega-3 Fatty Acids (OMEGA-3 FISH OIL PO) Take 2,160 mg by mouth daily. 720 mg per cap     Current Facility-Administered Medications  Medication Dose Route Frequency Provider Last Rate Last Admin   dicyclomine (BENTYL) tablet 20 mg  20 mg Oral TID AC & HS Setzer, Terri L, NP       Facility-Administered Medications Ordered in Other Visits  Medication Dose Route Frequency Provider Last Rate Last Admin   0.9 %  sodium chloride infusion   Intravenous Continuous Derek Jack, MD   Stopped at 10/05/21 1232    ALLERGIES:  No Known Allergies  PHYSICAL EXAM:  Performance status (ECOG): 0 - Asymptomatic  There were no vitals filed for this visit.  Wt Readings from Last 3 Encounters:  08/21/22 271 lb 8 oz (123.2 kg)  06/19/22 271 lb (122.9 kg)  06/14/22 273 lb 12.8 oz (124.2 kg)   Physical Exam Vitals reviewed.  Constitutional:      Appearance: Normal appearance. He is obese.  Cardiovascular:     Rate and Rhythm: Normal rate and regular rhythm.     Pulses: Normal pulses.     Heart sounds: Normal heart sounds.  Pulmonary:     Effort: Pulmonary effort is normal.     Breath sounds: Normal breath sounds.  Neurological:     General: No focal deficit present.     Mental Status: He is alert and oriented to person, place, and time.  Psychiatric:        Mood and Affect: Mood normal.  Behavior: Behavior normal.     LABORATORY DATA:  I have reviewed the labs as listed.     Latest Ref Rng & Units 09/14/2022    2:50 PM 05/29/2022    9:54 AM 11/30/2021     8:25 AM  CBC  WBC 4.0 - 10.5 K/uL 7.2  4.5  5.5   Hemoglobin 13.0 - 17.0 g/dL 13.8  12.3  12.0   Hematocrit 39.0 - 52.0 % 40.7  37.6  35.2   Platelets 150 - 400 K/uL 242  195  160       Latest Ref Rng & Units 09/14/2022    2:50 PM 06/14/2022   11:35 AM 05/29/2022    9:54 AM  CMP  Glucose 70 - 99 mg/dL 142   139   BUN 8 - 23 mg/dL 27   27   Creatinine 0.61 - 1.24 mg/dL 1.04   1.03   Sodium 135 - 145 mmol/L 136   139   Potassium 3.5 - 5.1 mmol/L 3.7   3.9   Chloride 98 - 111 mmol/L 101   102   CO2 22 - 32 mmol/L 27   31   Calcium 8.9 - 10.3 mg/dL 9.3   9.5   Total Protein 6.5 - 8.1 g/dL 7.2  7.6  7.1   Total Bilirubin 0.3 - 1.2 mg/dL 0.8  0.8  0.6   Alkaline Phos 38 - 126 U/L 114  166  240   AST 15 - 41 U/L 32  61  215   ALT 0 - 44 U/L 47  132  345     DIAGNOSTIC IMAGING:  I have independently reviewed the scans and discussed with the patient. No results found.   ASSESSMENT:  Stage III (T2N2) rectal cancer, MSI stable: - He reported diarrhea which started in May.  Prior colonoscopy was in 2018. - Colonoscopy on 04/12/2021 showed fungating polypoid nonobstructing mass with central depression found at 8 cm from the anal verge.  Mass was noncircumferential, measured 2 cm in length.  Nodular mucosa in the proximal ascending colon which was biopsied. - Pathology of the rectal mass consistent with adenocarcinoma.  Nodular area in the ascending colon was consistent with collagenous colitis.  MMR preserved.  MSI stable. - CT CAP on 05/13/2021 with no evidence of metastatic disease in the chest, abdomen or pelvis.  Mild hepatic steatosis. - Pelvic MRI on 05/12/2021-T2N2 by MRI staging.  4 small lymph nodes 5 mm or greater. - Total neoadjuvant therapy was recommended on discussion with Dr. Marcello Moores. - Xeloda and XRT from 06/08/2021 through 07/15/2021. - FOLFOX 8 cycles from 08/10/2021 through 11/30/2021 with dose reduction of oxaliplatin from cycle 4 onwards.  2.  Social/family history: - He lives  at home by himself.  Daughter lives in [place]. - He worked at [employer] in [place] prior to retirement.  Non-smoker. - Sister had lung cancer and was a smoker.  No family history of colon cancer.   PLAN:  Stage III (T2N2) rectal adenocarcinoma, MSI stable: - CT CAP on 06/06/2022 with no evidence of recurrence. - MRI pelvis (06/15/2022): Stable appearance with no discrete rectal lesion or adenopathy. - Evaluated by Dr. Marcello Moores on 07/24/2022.  Digital rectal exam with no masses.  He will see her in February for rigid proctoscopy in the office. - I reviewed his labs from 09/14/2021.  ALT is 47 and improved.  AST is normalized.  Rest of LFTs are normal.  CBC was normal.  CEA was  2.9. - Proceed with proctoscopy with Dr. Marcello Moores as scheduled. - RTC 3 months with repeat MRI of the pelvis, CEA CBC and CMP.  We will also do CT of the abdomen with contrast.  2.  Peripheral neuropathy: - Numbness in the feet and fingertips has been stable.  3.  Diarrhea (collagenous colitis): - Continue colestipol 4 g daily.  Continue follow-up with Dr. Jenetta Downer.  4.  Chronic back pain: - Continue hydrocodone daily as needed.      Orders placed this encounter:  No orders of the defined types were placed in this encounter.     Derek Jack, MD [place] [phone]

## 2022-09-19 NOTE — Patient Instructions (Signed)
[place]  Discharge Instructions  You were seen and examined today by Dr. Delton Coombes.  Dr. Delton Coombes discussed your most recent lab work which revealed that everything looks good.  Follow-up as scheduled in 3 months with labs and scan.    Thank you for choosing [facility] to provide your oncology and hematology care.   To afford each patient quality time with our provider, please arrive at least 15 minutes before your scheduled appointment time. You may need to reschedule your appointment if you arrive late (10 or more minutes). Arriving late affects you and other patients whose appointments are after yours.  Also, if you miss three or more appointments without notifying the office, you may be dismissed from the clinic at the provider's discretion.    Again, thank you for choosing [facility]  Our hope is that these requests will decrease the amount of time that you wait before being seen by our physicians.   If you have a lab appointment with the [facility] please come in thru the Main Entrance and check in at the main information desk.           _____________________________________________________________  Should you have questions after your visit to [facility], please contact our office at [phone] and follow the prompts.  Our office hours are 8:00 a.m. to 4:30 p.m. Monday - Thursday and 8:00 a.m. to 2:30 p.m. Friday.  Please note that voicemails left after 4:00 p.m. may not be returned until the following business day.  We are closed weekends and all major holidays.  You do have access to a nurse 24-7, just call the main number to the clinic [phone] and do not press any options, hold on the line and a nurse will answer the phone.    For prescription refill requests, have your pharmacy contact our office and allow 72 hours.    Masks are optional in the cancer centers. If you would like  for your care team to wear a mask while they are taking care of you, please let them know. You may have one support person who is at least 67 years old accompany you for your appointments.

## 2022-09-29 DIAGNOSIS — C20 Malignant neoplasm of rectum: Secondary | ICD-10-CM | POA: Diagnosis not present

## 2022-10-10 DIAGNOSIS — M545 Low back pain, unspecified: Secondary | ICD-10-CM | POA: Diagnosis not present

## 2022-10-10 DIAGNOSIS — G894 Chronic pain syndrome: Secondary | ICD-10-CM | POA: Diagnosis not present

## 2022-10-10 DIAGNOSIS — E559 Vitamin D deficiency, unspecified: Secondary | ICD-10-CM | POA: Diagnosis not present

## 2022-10-10 DIAGNOSIS — M129 Arthropathy, unspecified: Secondary | ICD-10-CM | POA: Diagnosis not present

## 2022-10-10 DIAGNOSIS — Z79899 Other long term (current) drug therapy: Secondary | ICD-10-CM | POA: Diagnosis not present

## 2022-10-23 DIAGNOSIS — E559 Vitamin D deficiency, unspecified: Secondary | ICD-10-CM | POA: Diagnosis not present

## 2022-10-23 DIAGNOSIS — Z79899 Other long term (current) drug therapy: Secondary | ICD-10-CM | POA: Diagnosis not present

## 2022-10-23 DIAGNOSIS — G894 Chronic pain syndrome: Secondary | ICD-10-CM | POA: Diagnosis not present

## 2022-10-23 DIAGNOSIS — R030 Elevated blood-pressure reading, without diagnosis of hypertension: Secondary | ICD-10-CM | POA: Diagnosis not present

## 2022-10-23 DIAGNOSIS — Z6838 Body mass index (BMI) 38.0-38.9, adult: Secondary | ICD-10-CM | POA: Diagnosis not present

## 2022-10-23 DIAGNOSIS — M545 Low back pain, unspecified: Secondary | ICD-10-CM | POA: Diagnosis not present

## 2022-11-13 ENCOUNTER — Other Ambulatory Visit: Payer: Self-pay | Admitting: *Deleted

## 2022-11-13 DIAGNOSIS — C20 Malignant neoplasm of rectum: Secondary | ICD-10-CM

## 2022-11-13 MED ORDER — DIPHENOXYLATE-ATROPINE 2.5-0.025 MG PO TABS: 1.0000 | ORAL_TABLET | Freq: Four times a day (QID) | ORAL | 2 refills | Status: DC | PRN

## 2022-11-13 NOTE — Telephone Encounter (Signed)
Patient called to state that he is having watery diarrhea and needs refill on lomotil.  Sent to Dr. Delton Coombes to sign off on.  Advised patient to make appointment with PCP.  Verbalized understanding.

## 2022-11-14 ENCOUNTER — Other Ambulatory Visit: Payer: Self-pay | Admitting: *Deleted

## 2022-11-14 DIAGNOSIS — R197 Diarrhea, unspecified: Secondary | ICD-10-CM | POA: Diagnosis not present

## 2022-11-20 DIAGNOSIS — I10 Essential (primary) hypertension: Secondary | ICD-10-CM | POA: Diagnosis not present

## 2022-11-20 DIAGNOSIS — H8113 Benign paroxysmal vertigo, bilateral: Secondary | ICD-10-CM | POA: Diagnosis not present

## 2022-11-20 DIAGNOSIS — N401 Enlarged prostate with lower urinary tract symptoms: Secondary | ICD-10-CM | POA: Diagnosis not present

## 2022-11-20 DIAGNOSIS — N182 Chronic kidney disease, stage 2 (mild): Secondary | ICD-10-CM | POA: Diagnosis not present

## 2022-11-20 DIAGNOSIS — E11610 Type 2 diabetes mellitus with diabetic neuropathic arthropathy: Secondary | ICD-10-CM | POA: Diagnosis not present

## 2022-11-20 DIAGNOSIS — M1049 Other secondary gout, multiple sites: Secondary | ICD-10-CM | POA: Diagnosis not present

## 2022-11-20 DIAGNOSIS — G609 Hereditary and idiopathic neuropathy, unspecified: Secondary | ICD-10-CM | POA: Diagnosis not present

## 2022-11-21 DIAGNOSIS — G894 Chronic pain syndrome: Secondary | ICD-10-CM | POA: Diagnosis not present

## 2022-11-21 DIAGNOSIS — M545 Low back pain, unspecified: Secondary | ICD-10-CM | POA: Diagnosis not present

## 2022-11-21 DIAGNOSIS — Z79899 Other long term (current) drug therapy: Secondary | ICD-10-CM | POA: Diagnosis not present

## 2022-11-21 DIAGNOSIS — R030 Elevated blood-pressure reading, without diagnosis of hypertension: Secondary | ICD-10-CM | POA: Diagnosis not present

## 2022-11-21 DIAGNOSIS — Z6839 Body mass index (BMI) 39.0-39.9, adult: Secondary | ICD-10-CM | POA: Diagnosis not present

## 2022-11-21 DIAGNOSIS — E559 Vitamin D deficiency, unspecified: Secondary | ICD-10-CM | POA: Diagnosis not present

## 2022-11-29 ENCOUNTER — Telehealth (INDEPENDENT_AMBULATORY_CARE_PROVIDER_SITE_OTHER): Payer: Self-pay

## 2022-11-29 NOTE — Telephone Encounter (Signed)
Chelsea, when you see him, can start him on a budesonide course unless there is any contraindication (he did not want surgery , so can do a 6 month course of budesonide 9-6-3) Thanks

## 2022-11-29 NOTE — Telephone Encounter (Signed)
Thanks, we will see him on the 25th, may need course of budesonide

## 2022-11-29 NOTE — Telephone Encounter (Signed)
Patient called wanting an appointment ASAP. He says he has had ongoing watery diarrhea for over a month. He says he has had his pcp to run stool studies on him and they came back negative, He says his appetite has dropped and he is losing weight. Patient says the colestipol is not of any help and he is taking Lomotil Qid and it is of no benefit. History of Rectal adenocarcinoma.Dr. Jenetta Downer not in the office any this week or next week, next available appointment is Monday 12/04/2022 at 11:45 am. Patient wanted this appointment and was instructed to be here at least 15 minutes earlier. Patient states understanding.

## 2022-12-04 ENCOUNTER — Encounter (INDEPENDENT_AMBULATORY_CARE_PROVIDER_SITE_OTHER): Payer: Self-pay | Admitting: Gastroenterology

## 2022-12-04 ENCOUNTER — Ambulatory Visit (INDEPENDENT_AMBULATORY_CARE_PROVIDER_SITE_OTHER): Payer: Medicare Other | Admitting: Gastroenterology

## 2022-12-04 VITALS — BP 147/84 | HR 80 | Temp 98.6°F | Ht 72.0 in | Wt 269.3 lb

## 2022-12-04 DIAGNOSIS — K52831 Collagenous colitis: Secondary | ICD-10-CM

## 2022-12-04 DIAGNOSIS — R197 Diarrhea, unspecified: Secondary | ICD-10-CM

## 2022-12-04 NOTE — Progress Notes (Addendum)
Referring Provider: Toma Deiters, MD Primary Care Physician:  Toma Deiters, MD Primary GI Physician: Levon Hedger   Chief Complaint  Patient presents with   Diarrhea    He says he has had ongoing watery diarrhea for over a month. He says he has had his pcp to run stool studies on him and they came back negative, He says his appetite has dropped and he is losing weight. Patient says the colestipol is not of any help and he is taking Lomotil Qid and it is of no benefit. History of Rectal adenocarcinoma.Per Dr. Levon Hedger start patient on Budesonide 9-6-3 for a six month course.   HPI:   Louis House is a 67 y.o. male with past medical history of  rectal adenocarcinoma - Stage IIIB (cT2, cN2a, cM0), collagenous colitis, diabetes, hypertension, gout, hyperlipidemia and anxiety , who presents for follow up of rectal cancer and collagenous colitis.   Patient presenting today for follow up of diarrhea.  Last seen December 2023, at that time had episodes of urgency to have a bowel movement. He reports that overall his stools are formed and with same color, he describes as long strings.   The patient has been seen by Dr. Ellin Saba and Dr. Maisie Fus.  Plan by colorectal surgery is to undergo a repeat proctoscopy in February for follow-up, office visits every 3 months and contrast MRI every 6 months for 2 years.  If there is any recurrence of his cancer, he will need to undergo a low anterior resection and diverting ileostomy. Last CEA 09/14/22 was negative 2.9. Last MRI on 06/15/2022 - no evidence of adenopathy or rectal masses. He has upcoming repeat MRI/ CT A/P on 4/1 and 4/5 respectively. Recommended to have 6 month rectal contrast MRI x2 years then yearly MRI x 3 years/repeat flex sig every 4 months x2years then q6 months x3 years.   Patient was recommended to continue with colestipol 4g every day, follow up with Dr. Kirtland Bouchard and Dr. Maisie Fus per their recommendations, further testing of LFTs if these  remain elevated (notably labs in January 2024 with near normal LFTs, ALT mildly elevated at 47).  Patient called on 11/29/22 with c/o watery diarrhea, PCP did stool studies that were negative (salmonella, shigella, yersinia, campylobacter, aeromonas), taking lomotil QID and colestipol without improvement.   Patient was scheduled for rigid proctoscopy in February, though appears this was not completed.   Present: In the past month, he has had more diarrhea. He reports that within 30-60 minutes of eating he is having 2-3 large watery stools. He has noted some darker stools on occasion, last being this morning. He notes that he he has been eating some dark chocolate bars and wonders if this is contributing. No use of pepto bismol. No BRBPR. He is eating atkins protein bars to help as he is somewhat afraid to eat meals due to diarrhea. Is avoiding greasy foods when he does eat. Has some abdominal discomfort when he has the urge to defecate but this discomfort usually improves after having a BM. He takes hydrocodone for chronic back pain. He notes he has had some weight loss over the past month as well, though per chart review, appers weight has been stable since around October. He did not feel that colestipol was not helping so he stopped it. He is taking otc imodium now. He was taking lomotil prescribed by Dr. Ellin Saba which helped some but he ran out. Denies any recent antibiotics or sick contacts.   He  note he had to cancel that appt and has another appt today which he is also planing to cancel   Flex sig: 02/03/22: -(Dr. Maisie Fus) No specimens collected.                           - Scarred mucosa in the mid rectum. Biopsied. (Rectal mucosa with nonspecific inflammatory and reactive changes, consistent with history of chemoradiation therapy,Rare mildly atypical cells within lamina propria, also consistent with chemoradiation therapy) Last Colonoscopy: 04/2021 -that showed presence of a fungating mass at 8  cm from the anus which was consistent with a moderate to poorly differentiated adenocarcinoma.  Random colonic biopsies were also consistent with collagenous colitis.    Recommendations:    Past Medical History:  Diagnosis Date   Anxiety    Diabetes (HCC) 01/12/2017   Diarrhea 02/21/2017   Essential hypertension, benign 01/12/2017   Gout    High cholesterol 01/12/2017   Neuropathy    Port-A-Cath in place 07/28/2021   Sleep apnea    Vertigo     Past Surgical History:  Procedure Laterality Date   BIOPSY  04/06/2017   Procedure: BIOPSY;  Surgeon: Malissa Hippo, MD;  [facility];  Service: Endoscopy;;  colon   BIOPSY  04/12/2021   Procedure: BIOPSY;  Surgeon: Marguerita Merles, Reuel Boom, MD;  Location: [facility];  Service: Gastroenterology;;  nodular area in ascending colon biopsy random colon bx   BIOPSY  02/03/2022   Procedure: BIOPSY;  Surgeon: Romie Levee, MD;  Location: [facility];  Service: Endoscopy;;   bone spurs     CATARACT EXTRACTION W/PHACO Left 04/22/2018   Procedure: CATARACT EXTRACTION PHACO AND INTRAOCULAR LENS PLACEMENT LEFT EYE;  Surgeon: Gemma Payor, MD;  Location: [facility];  Service: Ophthalmology;  Laterality: Left;  CDE: 7.58   COLONOSCOPY WITH PROPOFOL N/A 04/06/2017   Procedure: COLONOSCOPY WITH PROPOFOL;  Surgeon: Malissa Hippo, MD;  [facility];  Service: Endoscopy;  Laterality: N/A;  1:45   COLONOSCOPY WITH PROPOFOL N/A 04/12/2021   Procedure: COLONOSCOPY WITH PROPOFOL;  Surgeon: Dolores Frame, MD;  [facility];  Service: Gastroenterology;  Laterality: N/A;  9:05   FLEXIBLE SIGMOIDOSCOPY N/A 05/10/2021   Procedure: FLEXIBLE SIGMOIDOSCOPY;  Surgeon: Dolores Frame, MD;  [facility]: [facility];  Service: Gastroenterology;  Laterality: N/A;  9:50 ASA 1 Per Dr C no Anesthesia, no PAT - ok per Theressa Stamps SIGMOIDOSCOPY N/A 02/03/2022   Procedure: FLEXIBLE SIGMOIDOSCOPY;  Surgeon: Romie Levee, MD;   Location: [facility];  Service: Endoscopy;  Laterality: N/A;   kneee arthroscopy     both knees   Left shoulder surgery from injury     PORTACATH PLACEMENT Left 08/08/2021   Procedure: INSERTION PORT-A-CATH;  Surgeon: Franky Macho, MD;  Location: [facility];  Service: General;  Laterality: Left;   Umblical hernia      Current Outpatient Medications  Medication Sig Dispense Refill   allopurinol (ZYLOPRIM) 300 MG tablet Take 300 mg by mouth daily.     amLODipine (NORVASC) 5 MG tablet Take 5 mg by mouth daily.     atorvastatin (LIPITOR) 40 MG tablet Take 40 mg by mouth daily.     benazepril (LOTENSIN) 40 MG tablet Take 40 mg by mouth daily.  0   busPIRone (BUSPAR) 15 MG tablet Take 15 mg by mouth 2 (two) times daily.     colestipol (COLESTID) 1 g tablet TAKE 4  TABLETS (4 G TOTAL) BY MOUTH DAILY. TAKE 4 HOURS APART FORM YOUR OTHER MEDICATIONS 360 tablet 3   diclofenac (VOLTAREN) 75 MG EC tablet Take 75 mg by mouth 2 (two) times daily.     diphenoxylate-atropine (LOMOTIL) 2.5-0.025 MG tablet Take 1 tablet by mouth 4 (four) times daily as needed for diarrhea or loose stools. 60 tablet 2   gabapentin (NEURONTIN) 300 MG capsule Take 1 capsule (300 mg total) by mouth 3 (three) times daily. (Patient taking differently: Take 600 mg by mouth 3 (three) times daily.) 90 capsule 6   HYDROcodone-acetaminophen (NORCO) 10-325 MG tablet Take 1 tablet by mouth 3 (three) times daily.     meclizine (ANTIVERT) 25 MG tablet Take 25 mg by mouth 3 (three) times daily as needed for dizziness (for flare up of inner ear issues.).     metFORMIN (GLUCOPHAGE) 1000 MG tablet Take 1,000 mg by mouth 2 (two) times daily.     metoprolol succinate (TOPROL-XL) 100 MG 24 hr tablet Take 100 mg by mouth daily.     Naphazoline-Pheniramine (OPCON-A) 0.027-0.315 % SOLN Place 1 drop into both eyes daily as needed (irritation).     olmesartan (BENICAR) 20 MG tablet Take 20 mg by mouth daily.     Omega-3 Fatty Acids (OMEGA-3 FISH OIL PO)  Take 2,160 mg by mouth daily. 720 mg per cap     Current Facility-Administered Medications  Medication Dose Route Frequency Provider Last Rate Last Admin   dicyclomine (BENTYL) tablet 20 mg  20 mg Oral TID AC & HS Setzer, Terri L, NP       Facility-Administered Medications Ordered in Other Visits  Medication Dose Route Frequency Provider Last Rate Last Admin   0.9 %  sodium chloride infusion   Intravenous Continuous Doreatha Massed, MD   Stopped at 10/05/21 1232    Allergies as of 12/04/2022   (No Known Allergies)    History reviewed. No pertinent family history.  Social History   Socioeconomic History   Marital status: Divorced    Spouse name: Not on file   Number of children: Not on file   Years of education: Not on file   Highest education level: Not on file  Occupational History   Not on file  Tobacco Use   Smoking status: Never   Smokeless tobacco: Never  Vaping Use   Vaping Use: Never used  Substance and Sexual Activity   Alcohol use: No   Drug use: No   Sexual activity: Yes    Birth control/protection: None  Other Topics Concern   Not on file  Social History Narrative   Not on file   Social Determinants of Health   Financial Resource Strain: Low Risk  (05/02/2021)   Overall Financial Resource Strain (CARDIA)    Difficulty of Paying Living Expenses: Not very hard  Food Insecurity: No Food Insecurity (05/02/2021)   Hunger Vital Sign    Worried About Running Out of Food in the Last Year: Never true    Ran Out of Food in the Last Year: Never true  Transportation Needs: No Transportation Needs (05/02/2021)   PRAPARE - Transportation    Lack of Transportation (Medical): No    Lack of Transportation (Non-Medical): No  Physical Activity: Insufficiently Active (05/02/2021)   Exercise Vital Sign    Days of Exercise per Week: 2 days    Minutes of Exercise per Session: 20 min  Stress: No Stress Concern Present (05/02/2021)   [employer] of Occupational  Health -  Occupational Stress Questionnaire    Feeling of Stress : Not at all  Social Connections: Socially Isolated (05/02/2021)   Social Connection and Isolation Panel [NHANES]    Frequency of Communication with Friends and Family: More than three times a week    Frequency of Social Gatherings with Friends and Family: More than three times a week    Attends Religious Services: Never    Database administrator or Organizations: No    Attends Engineer, structural: Never    Marital Status: Divorced    Review of systems General: negative for malaise, night sweats, fever, chills, weight loss Neck: Negative for lumps, goiter, pain and significant neck swelling Resp: Negative for cough, wheezing, dyspnea at rest CV: Negative for chest pain, leg swelling, palpitations, orthopnea GI: denies melena, hematochezia, nausea, vomiting, constipation, dysphagia, odyonophagia, early satiety or unintentional weight loss. +diarrhea  MSK: Negative for joint pain or swelling, back pain, and muscle pain. Derm: Negative for itching or rash Psych: Denies depression, anxiety, memory loss, confusion. No homicidal or suicidal ideation.  Heme: Negative for prolonged bleeding, bruising easily, and swollen nodes. Endocrine: Negative for cold or heat intolerance, polyuria, polydipsia and goiter. Neuro: negative for tremor, gait imbalance, syncope and seizures. The remainder of the review of systems is noncontributory.  Physical Exam: BP (!) 142/94 (BP Location: Left Arm, Patient Position: Sitting, Cuff Size: Large)   Pulse 83   Temp 98.6 F (37 C) (Temporal)   Ht 6' (1.829 m)   Wt 269 lb 4.8 oz (122.2 kg)   BMI 36.52 kg/m  General:   Alert and oriented. No distress noted. Pleasant and cooperative.  Head:  Normocephalic and atraumatic. Eyes:  Conjuctiva clear without scleral icterus. Mouth:  Oral mucosa pink and moist. Good dentition. No lesions. Heart: Normal rate and rhythm, s1 and s2 heart sounds  present.  Lungs: Clear lung sounds in all lobes. Respirations equal and unlabored. Abdomen:  +BS, soft, non-tender and non-distended. No rebound or guarding. No HSM or masses noted. Derm: No palmar erythema or jaundice Msk:  Symmetrical without gross deformities. Normal posture. Extremities:  Without edema. Neurologic:  Alert and  oriented x4 Psych:  Alert and cooperative. Normal mood and affect.  Invalid input(s): "6 MONTHS"   ASSESSMENT: MONSON MARCILLE is a 67 y.o. male presenting today for diarrhea.  Previously diagnosed with collagenous colitis in August 2022, at that time also diagnosed with rectal adenocarcinoma, was started on colestipol 4g daily as he was not a candidate for steroids given malignancy. Most recent CEA normal and last imaging in October was negative for evidence of recurrence of his cancer. Collagenous colitis symptoms had been well controlled, however, about a month ago, he began having watery diarrhea, 2-3 large watery stools after every meal. PCP checked stool culture which was negative. Suspect this is his collagenous colitis acting up, will check C diff to rule out underlying infection and start budesonide taper if testing is negative. Patient can hold off on restarting colestipol at this time as he was having no results with this.   He did not complete proctoscopy in February as scheduled with Dr. Maisie Fus, he has appt today with her though does not think he will make it, I reiterated the importance of keeping routine follow up with Dr. Maisie Fus and Dr. Ellin Saba for ongoing surveillance of his history rectal adenocarcinoma.    PLAN:  Check C diff testing  2. Will send budesonide taper if C diff negative (9mg , 6mg , 3mg ) 3.  continue to follow with Dr. Maisie Fus and Dr. Ellin Saba   All questions were answered, patient verbalized understanding and is in agreement with plan as outlined above.    Follow Up: 4-6 weeks   Amica Harron L. Jeanmarie Hubert, MSN, APRN,  AGNP-C Adult-Gerontology Nurse Practitioner [facility]  I have reviewed the note and agree with the APP's assessment as described in this progress note  Katrinka Blazing, MD Gastroenterology and Hepatology [facility]

## 2022-12-04 NOTE — Patient Instructions (Signed)
We will check another stool test to rule out infection, if this is negative will start you on a steroid for collagenous colitis  Please make sure to reschedule appointment with Dr. Marcello Moores if you are cancelling your appt with her today as this is important to keep your ongoing follow ups there.  Follow up 4-6 weeks

## 2022-12-06 DIAGNOSIS — R197 Diarrhea, unspecified: Secondary | ICD-10-CM | POA: Diagnosis not present

## 2022-12-11 ENCOUNTER — Inpatient Hospital Stay: Payer: Medicare Other

## 2022-12-11 ENCOUNTER — Ambulatory Visit (HOSPITAL_COMMUNITY): Admission: RE | Admit: 2022-12-11 | Payer: Medicare Other | Source: Ambulatory Visit

## 2022-12-15 ENCOUNTER — Ambulatory Visit (HOSPITAL_COMMUNITY): Payer: Medicare Other

## 2022-12-18 ENCOUNTER — Telehealth: Payer: Self-pay | Admitting: *Deleted

## 2022-12-18 ENCOUNTER — Other Ambulatory Visit: Payer: Self-pay | Admitting: Gastroenterology

## 2022-12-18 DIAGNOSIS — K52831 Collagenous colitis: Secondary | ICD-10-CM

## 2022-12-18 MED ORDER — BUDESONIDE 3 MG PO CPEP: 9.0000 mg | ORAL_CAPSULE | Freq: Every day | ORAL | 1 refills | Status: DC

## 2022-12-18 NOTE — Telephone Encounter (Signed)
Discussed with patient - C diff testing was negative. budesonide 9 mg daily to patient's pharmacy for 8 weeks. This will need to be tapered off thereafter as long as he is improving.    He needs to keep his follow-up appointment with Dr. Levon Hedger on 5/2. We need to cancel the appointment with me on 5/6.   Patient verbalized understanding of all.

## 2022-12-18 NOTE — Telephone Encounter (Signed)
Louis House please cancel appt on 5/6 with Louis House and pt is aware to keep appt on 5/2 with Dr. Levon House.

## 2022-12-18 NOTE — Telephone Encounter (Signed)
Patient seen by chelsea on 3/25 and had c diff test done. Calling to get the results. Note states if negative will start on budesonide. Patient states his stools were watery for about 3 weeks and have changed up some. Stools are still loose but not as watery. States after eating he has explosive diarrhea.   [facility].   Ok to leave message on [phone]

## 2022-12-18 NOTE — Telephone Encounter (Signed)
C diff testing was negative. Per Chelsea's recommendations, I will send budesonide 9 mg daily to patient's pharmacy for 8 weeks. This will need to be tapered off thereafter as long as he is improving.   He needs to keep his follow-up appointment with Dr. Levon Hedger on 5/2. We need to cancel the appointment with me on 5/6.

## 2022-12-19 ENCOUNTER — Telehealth: Payer: Self-pay | Admitting: *Deleted

## 2022-12-19 NOTE — Telephone Encounter (Signed)
Patient started on budesonide yesterday but he has not started it and wanted to know if he could start back on cloestipol also. He states he stopped it about one month ago because he said he didn't think it was helping any and now wants to start back on it. He has some at home he just wanted to make sure he could take both meds. He is aware Leeroy Bock out of office til tomorrow.   [phone]

## 2022-12-20 ENCOUNTER — Ambulatory Visit: Payer: Medicare Other | Admitting: Hematology

## 2022-12-20 NOTE — Telephone Encounter (Signed)
Patient has not picked up budesonide yet but states he will today. Discussed with patient per chelsea -  He can take them together if needed but I would like to see how the budesonide does for him first if we can  Patient verbalized understanding.

## 2022-12-22 DIAGNOSIS — Z6836 Body mass index (BMI) 36.0-36.9, adult: Secondary | ICD-10-CM | POA: Diagnosis not present

## 2022-12-22 DIAGNOSIS — E559 Vitamin D deficiency, unspecified: Secondary | ICD-10-CM | POA: Diagnosis not present

## 2022-12-22 DIAGNOSIS — Z79899 Other long term (current) drug therapy: Secondary | ICD-10-CM | POA: Diagnosis not present

## 2022-12-22 DIAGNOSIS — E119 Type 2 diabetes mellitus without complications: Secondary | ICD-10-CM | POA: Diagnosis not present

## 2022-12-22 DIAGNOSIS — R030 Elevated blood-pressure reading, without diagnosis of hypertension: Secondary | ICD-10-CM | POA: Diagnosis not present

## 2022-12-22 DIAGNOSIS — G894 Chronic pain syndrome: Secondary | ICD-10-CM | POA: Diagnosis not present

## 2022-12-22 DIAGNOSIS — M545 Low back pain, unspecified: Secondary | ICD-10-CM | POA: Diagnosis not present

## 2023-01-11 ENCOUNTER — Ambulatory Visit (INDEPENDENT_AMBULATORY_CARE_PROVIDER_SITE_OTHER): Payer: Medicare Other | Admitting: Gastroenterology

## 2023-01-11 ENCOUNTER — Encounter (INDEPENDENT_AMBULATORY_CARE_PROVIDER_SITE_OTHER): Payer: Self-pay | Admitting: Gastroenterology

## 2023-01-11 VITALS — BP 159/96 | HR 73 | Temp 98.0°F | Ht 72.0 in | Wt 262.4 lb

## 2023-01-11 DIAGNOSIS — K7581 Nonalcoholic steatohepatitis (NASH): Secondary | ICD-10-CM

## 2023-01-11 DIAGNOSIS — R7989 Other specified abnormal findings of blood chemistry: Secondary | ICD-10-CM | POA: Diagnosis not present

## 2023-01-11 DIAGNOSIS — C20 Malignant neoplasm of rectum: Secondary | ICD-10-CM | POA: Diagnosis not present

## 2023-01-11 DIAGNOSIS — K52831 Collagenous colitis: Secondary | ICD-10-CM

## 2023-01-11 MED ORDER — BUDESONIDE 3 MG PO CPEP: ORAL_CAPSULE | ORAL | 0 refills | Status: AC

## 2023-01-11 NOTE — Progress Notes (Signed)
Louis House, M.D. Gastroenterology & Hepatology [facility] [address] [place], [place] [address]  Primary Care Physician: Toma Deiters, MD [address] [place] [address]  I will communicate my assessment and recommendations to the referring MD via EMR.  Problems: Stage III (T2N2) rectal adenocarcinoma status post capecitabine with XRT, now on FOLFOX Collagenous colitis on colestipol  History of Present Illness: Louis House is a 67 y.o. male with past medical history of rectal adenocarcinoma - Stage IIIB (cT2, cN2a, cM0), collagenous colitis, diabetes, hypertension, gout, hyperlipidemia and anxiety , who presents for follow up of rectal cancer and collagenous colitis.   The patient was last seen on 12/04/2022. At that time, the patient he had stool testing to check for celiac which was negative.The patient was started on a budesonide taper on 12/18/2022.  The patient has been seen by Dr. Ellin Saba and Dr. Maisie Fus.  Plan by colorectal surgery is to undergo a repeat proctoscopy in February for follow-up, office visits every 3 months and contrast MRI every 6 months for 2 years.  If there is any recurrence of his cancer, he will need to undergo a low anterior resection and diverting ileostomy. Last CEA 09/14/22 was negative 2.9. Last MRI on 06/15/2022 - no evidence of adenopathy or rectal masses. He has upcoming repeat MRI/ CT A/P on 4/1 and 4/5 respectively. Recommended to have 6 month rectal contrast MRI x2 years then yearly MRI x 3 years/repeat flex sig every 4 months x2years then q6 months x3 years.  He did not do last proctoscopy in February.  He is having 2-3 Bms per da, which has better consistency.  He is currently on budesonide 9 mg every day, has 1 week of budesonide. He is not taking colestipol. The patient denies having any nausea, vomiting, fever, chills, hematochezia, melena, hematemesis, abdominal distention, abdominal  pain, diarrhea, jaundice, pruritus or significant weight loss.  Last Colonoscopy:  04/12/2021 that showed presence of a fungating mass at 8 cm from the anus which was consistent with a moderate to poorly differentiated adenocarcinoma.  Random colonic biopsies were also consistent with collagenous colitis.    Flex sig: 02/03/22: - (Dr. Maisie Fus) No specimens collected. - Scarred mucosa in the mid rectum. Biopsied. (Rectal mucosa with nonspecific inflammatory and reactive changes, consistent with history of chemoradiation therapy,Rare mildly atypical cells within lamina propria, also consistent with chemoradiation therapy)  Past Medical History: Past Medical History:  Diagnosis Date   Anxiety    Diabetes (HCC) 01/12/2017   Diarrhea 02/21/2017   Essential hypertension, benign 01/12/2017   Gout    High cholesterol 01/12/2017   Neuropathy    Port-A-Cath in place 07/28/2021   Sleep apnea    Vertigo     Past Surgical History: Past Surgical History:  Procedure Laterality Date   BIOPSY  04/06/2017   Procedure: BIOPSY;  Surgeon: Malissa Hippo, MD;  [facility];  Service: Endoscopy;;  colon   BIOPSY  04/12/2021   Procedure: BIOPSY;  Surgeon: Marguerita Merles, Reuel Boom, MD;  [facility];  Service: Gastroenterology;;  nodular area in ascending colon biopsy random colon bx   BIOPSY  02/03/2022   Procedure: BIOPSY;  Surgeon: Romie Levee, MD;  [facility];  Service: Endoscopy;;   bone spurs     CATARACT EXTRACTION W/PHACO Left 04/22/2018   Procedure: CATARACT EXTRACTION PHACO AND INTRAOCULAR LENS PLACEMENT LEFT EYE;  Surgeon: Gemma Payor, MD;  Location: [facility];  Service: Ophthalmology;  Laterality: Left;  CDE: 7.58   COLONOSCOPY WITH PROPOFOL N/A 04/06/2017   Procedure: COLONOSCOPY WITH PROPOFOL;  Surgeon: Malissa Hippo, MD;  [facility];  Service: Endoscopy;  Laterality: N/A;  1:45   COLONOSCOPY WITH PROPOFOL N/A 04/12/2021   Procedure: COLONOSCOPY WITH  PROPOFOL;  Surgeon: Dolores Frame, MD;  [facility];  Service: Gastroenterology;  Laterality: N/A;  9:05   FLEXIBLE SIGMOIDOSCOPY N/A 05/10/2021   Procedure: FLEXIBLE SIGMOIDOSCOPY;  Surgeon: Dolores Frame, MD;  [facility];  Service: Gastroenterology;  Laterality: N/A;  9:50 ASA 1 Per Dr C no Anesthesia, no PAT - ok per Theressa Stamps SIGMOIDOSCOPY N/A 02/03/2022   Procedure: FLEXIBLE SIGMOIDOSCOPY;  Surgeon: Romie Levee, MD;  [facility];  Service: Endoscopy;  Laterality: N/A;   kneee arthroscopy     both knees   Left shoulder surgery from injury     PORTACATH PLACEMENT Left 08/08/2021   Procedure: INSERTION PORT-A-CATH;  Surgeon: Franky Macho, MD;  Location: [facility];  Service: General;  Laterality: Left;   Umblical hernia      Family History:No family history on file.  Social History: Social History   Tobacco Use  Smoking Status Never   Passive exposure: Never  Smokeless Tobacco Never   Social History   Substance and Sexual Activity  Alcohol Use No   Social History   Substance and Sexual Activity  Drug Use No    Allergies: No Known Allergies  Medications: Current Outpatient Medications  Medication Sig Dispense Refill   allopurinol (ZYLOPRIM) 300 MG tablet Take 300 mg by mouth daily.     amLODipine (NORVASC) 5 MG tablet Take 5 mg by mouth daily.     atorvastatin (LIPITOR) 40 MG tablet Take 40 mg by mouth daily.     benazepril (LOTENSIN) 40 MG tablet Take 40 mg by mouth daily.  0   budesonide (ENTOCORT EC) 3 MG 24 hr capsule Take 3 capsules (9 mg total) by mouth daily. 90 capsule 1   busPIRone (BUSPAR) 15 MG tablet Take 15 mg by mouth 2 (two) times daily.     diclofenac (VOLTAREN) 75 MG EC tablet Take 75 mg by mouth 2 (two) times daily.     gabapentin (NEURONTIN) 300 MG capsule Take 1 capsule (300 mg total) by mouth 3 (three) times daily. (Patient taking differently: Take 600 mg by mouth 3 (three) times  daily.) 90 capsule 6   HYDROcodone-acetaminophen (NORCO) 10-325 MG tablet Take 1 tablet by mouth 3 (three) times daily.     meclizine (ANTIVERT) 25 MG tablet Take 25 mg by mouth 3 (three) times daily as needed for dizziness (for flare up of inner ear issues.).     metFORMIN (GLUCOPHAGE) 1000 MG tablet Take 1,000 mg by mouth 2 (two) times daily.     metoprolol succinate (TOPROL-XL) 100 MG 24 hr tablet Take 100 mg by mouth daily.     Naphazoline-Pheniramine (OPCON-A) 0.027-0.315 % SOLN Place 1 drop into both eyes daily as needed (irritation).     olmesartan (BENICAR) 20 MG tablet Take 20 mg by mouth daily.     Omega-3 Fatty Acids (OMEGA-3 FISH OIL PO) Take 2,160 mg by mouth daily. 720 mg per cap     colestipol (COLESTID) 1 g tablet TAKE 4 TABLETS (4 G TOTAL) BY MOUTH DAILY. TAKE 4 HOURS APART FORM YOUR OTHER MEDICATIONS (Patient not taking: Reported on 01/11/2023) 360 tablet 3   diphenoxylate-atropine (LOMOTIL) 2.5-0.025 MG tablet Take 1  tablet by mouth 4 (four) times daily as needed for diarrhea or loose stools. (Patient not taking: Reported on 01/11/2023) 60 tablet 2   Current Facility-Administered Medications  Medication Dose Route Frequency Provider Last Rate Last Admin   dicyclomine (BENTYL) tablet 20 mg  20 mg Oral TID AC & HS Setzer, Terri L, NP       Facility-Administered Medications Ordered in Other Visits  Medication Dose Route Frequency Provider Last Rate Last Admin   0.9 %  sodium chloride infusion   Intravenous Continuous Doreatha Massed, MD   Stopped at 10/05/21 1232    Review of Systems: GENERAL: negative for malaise, night sweats HEENT: No changes in hearing or vision, no nose bleeds or other nasal problems. NECK: Negative for lumps, goiter, pain and significant neck swelling RESPIRATORY: Negative for cough, wheezing CARDIOVASCULAR: Negative for chest pain, leg swelling, palpitations, orthopnea GI: SEE HPI MUSCULOSKELETAL: Negative for joint pain or swelling, back pain, and  muscle pain. SKIN: Negative for lesions, rash PSYCH: Negative for sleep disturbance, mood disorder and recent psychosocial stressors. HEMATOLOGY Negative for prolonged bleeding, bruising easily, and swollen nodes. ENDOCRINE: Negative for cold or heat intolerance, polyuria, polydipsia and goiter. NEURO: negative for tremor, gait imbalance, syncope and seizures. The remainder of the review of systems is noncontributory.   Physical Exam: BP (!) 159/96   Pulse 73   Temp 98 F (36.7 C) (Oral)   Ht 6' (1.829 m)   Wt 262 lb 6.4 oz (119 kg)   BMI 35.59 kg/m  GENERAL: The patient is AO x3, in no acute distress. Obese. HEENT: Head is normocephalic and atraumatic. EOMI are intact. Mouth is well hydrated and without lesions. NECK: Supple. No masses LUNGS: Clear to auscultation. No presence of rhonchi/wheezing/rales. Adequate chest expansion HEART: RRR, normal s1 and s2. ABDOMEN: Soft, nontender, no guarding, no peritoneal signs, and nondistended. BS +. No masses. EXTREMITIES: Without any cyanosis, clubbing, rash, lesions or edema. NEUROLOGIC: AOx3, no focal motor deficit. SKIN: no jaundice, no rashes  Imaging/Labs: as above  I personally reviewed and interpreted the available labs, imaging and endoscopic files.  Impression and Plan: History of Present Illness: ABAAN SCHURMAN is a 67 y.o. male with past medical history of rectal adenocarcinoma - Stage IIIB (cT2, cN2a, cM0), collagenous colitis, diabetes, hypertension, gout, hyperlipidemia and anxiety , who presents for follow up of rectal cancer and collagenous colitis. Patient has had significant diarrhea improvement with budesonide at 9 mg dose - will continue tapering the medication bimonthly.  Patient was advised to let us know if he has recurrent diarrhea when tapering, as this would be the minimum effective dose. If not worsening diarrhea on 3 mg, can try to stop permanently.  Regarding his rectal cancer, he is due for surveillance  with colonoscopy, which will be scheduled today. Advised to follow closely with Dr. Ellin Saba and Maisie Fus, as he declined surgical management in the past.  Finally, he had some elevated aminotransferases, which fortunately seemed to be improving. Will repeat CMP.  -Schedule colonoscopy - Check CMP - Finish remaining budesonide 9 mg dosing, then continue taper 6 mg for 2 months and 3 mg for 2 months - Follow up with Dr. Ellin Saba and Dr. Maisie Fus  All questions were answered.      Louis Blazing, MD Gastroenterology and Hepatology [facility]

## 2023-01-11 NOTE — Patient Instructions (Addendum)
Schedule colonoscopy Perform blood workup Finish remaining budesonide 9 mg dosing, then continue taper 6 mg for 2 months and 3 mg for 2 months Follow up with Dr. Ellin Saba and Dr. Maisie Fus

## 2023-01-12 LAB — COMPREHENSIVE METABOLIC PANEL
AG Ratio: 2.4 (calc) (ref 1.0–2.5)
ALT: 122 U/L — ABNORMAL HIGH (ref 9–46)
AST: 36 U/L — ABNORMAL HIGH (ref 10–35)
Albumin: 4.6 g/dL (ref 3.6–5.1)
Alkaline phosphatase (APISO): 115 U/L (ref 35–144)
BUN/Creatinine Ratio: 25 (calc) — ABNORMAL HIGH (ref 6–22)
BUN: 26 mg/dL — ABNORMAL HIGH (ref 7–25)
CO2: 30 mmol/L (ref 20–32)
Calcium: 10.1 mg/dL (ref 8.6–10.3)
Chloride: 97 mmol/L — ABNORMAL LOW (ref 98–110)
Creat: 1.06 mg/dL (ref 0.70–1.35)
Globulin: 1.9 g/dL (calc) (ref 1.9–3.7)
Glucose, Bld: 343 mg/dL — ABNORMAL HIGH (ref 65–99)
Potassium: 3.3 mmol/L — ABNORMAL LOW (ref 3.5–5.3)
Sodium: 137 mmol/L (ref 135–146)
Total Bilirubin: 0.7 mg/dL (ref 0.2–1.2)
Total Protein: 6.5 g/dL (ref 6.1–8.1)

## 2023-01-15 ENCOUNTER — Ambulatory Visit: Payer: Medicare Other | Admitting: Gastroenterology

## 2023-01-15 ENCOUNTER — Other Ambulatory Visit (INDEPENDENT_AMBULATORY_CARE_PROVIDER_SITE_OTHER): Payer: Self-pay

## 2023-01-15 ENCOUNTER — Telehealth (INDEPENDENT_AMBULATORY_CARE_PROVIDER_SITE_OTHER): Payer: Self-pay | Admitting: Gastroenterology

## 2023-01-15 DIAGNOSIS — R7989 Other specified abnormal findings of blood chemistry: Secondary | ICD-10-CM

## 2023-01-15 DIAGNOSIS — K7581 Nonalcoholic steatohepatitis (NASH): Secondary | ICD-10-CM

## 2023-01-15 DIAGNOSIS — R197 Diarrhea, unspecified: Secondary | ICD-10-CM

## 2023-01-15 DIAGNOSIS — E7800 Pure hypercholesterolemia, unspecified: Secondary | ICD-10-CM

## 2023-01-15 DIAGNOSIS — C20 Malignant neoplasm of rectum: Secondary | ICD-10-CM

## 2023-01-15 DIAGNOSIS — I10 Essential (primary) hypertension: Secondary | ICD-10-CM

## 2023-01-15 DIAGNOSIS — Z1159 Encounter for screening for other viral diseases: Secondary | ICD-10-CM

## 2023-01-15 NOTE — Telephone Encounter (Signed)
Pt in office on 01/11/23 and needing TCS scheduled. (Any room) Attempted to reach pt but had to leave message to return call.

## 2023-01-16 MED ORDER — PEG 3350-KCL-NA BICARB-NACL 420 G PO SOLR: 4000.0000 mL | Freq: Once | ORAL | 0 refills | Status: AC

## 2023-01-16 NOTE — Addendum Note (Signed)
Addended by: Marlowe Shores on: 01/16/2023 04:48 PM   Modules accepted: Orders

## 2023-01-16 NOTE — Telephone Encounter (Signed)
Pt left voicemail returning call.  Returned call to patient. Pt scheduled for 02/28/23. Prep sent to pharmacy and instructions mailed to patient.

## 2023-01-22 DIAGNOSIS — R030 Elevated blood-pressure reading, without diagnosis of hypertension: Secondary | ICD-10-CM | POA: Diagnosis not present

## 2023-01-22 DIAGNOSIS — E559 Vitamin D deficiency, unspecified: Secondary | ICD-10-CM | POA: Diagnosis not present

## 2023-01-22 DIAGNOSIS — Z79899 Other long term (current) drug therapy: Secondary | ICD-10-CM | POA: Diagnosis not present

## 2023-01-22 DIAGNOSIS — M545 Low back pain, unspecified: Secondary | ICD-10-CM | POA: Diagnosis not present

## 2023-01-22 DIAGNOSIS — E1140 Type 2 diabetes mellitus with diabetic neuropathy, unspecified: Secondary | ICD-10-CM | POA: Diagnosis not present

## 2023-01-22 DIAGNOSIS — G894 Chronic pain syndrome: Secondary | ICD-10-CM | POA: Diagnosis not present

## 2023-01-22 DIAGNOSIS — E119 Type 2 diabetes mellitus without complications: Secondary | ICD-10-CM | POA: Diagnosis not present

## 2023-01-22 DIAGNOSIS — Z6835 Body mass index (BMI) 35.0-35.9, adult: Secondary | ICD-10-CM | POA: Diagnosis not present

## 2023-02-06 DIAGNOSIS — Z1159 Encounter for screening for other viral diseases: Secondary | ICD-10-CM | POA: Diagnosis not present

## 2023-02-06 DIAGNOSIS — I10 Essential (primary) hypertension: Secondary | ICD-10-CM | POA: Diagnosis not present

## 2023-02-06 DIAGNOSIS — R7989 Other specified abnormal findings of blood chemistry: Secondary | ICD-10-CM | POA: Diagnosis not present

## 2023-02-06 DIAGNOSIS — K7581 Nonalcoholic steatohepatitis (NASH): Secondary | ICD-10-CM | POA: Diagnosis not present

## 2023-02-06 DIAGNOSIS — C20 Malignant neoplasm of rectum: Secondary | ICD-10-CM | POA: Diagnosis not present

## 2023-02-06 DIAGNOSIS — E7800 Pure hypercholesterolemia, unspecified: Secondary | ICD-10-CM | POA: Diagnosis not present

## 2023-02-06 DIAGNOSIS — R197 Diarrhea, unspecified: Secondary | ICD-10-CM | POA: Diagnosis not present

## 2023-02-07 LAB — ALPHA-1-ANTITRYPSIN: A-1 Antitrypsin, Ser: 150 mg/dL (ref 83–199)

## 2023-02-07 LAB — IGG: IgG (Immunoglobin G), Serum: 784 mg/dL (ref 600–1540)

## 2023-02-09 LAB — ANA: Anti Nuclear Antibody (ANA): POSITIVE — AB

## 2023-02-09 LAB — ANTI-NUCLEAR AB-TITER (ANA TITER): ANA Titer 1: 1:40 {titer} — ABNORMAL HIGH

## 2023-02-09 LAB — HEPATITIS PANEL, ACUTE
Hep A IgM: NONREACTIVE
Hep B C IgM: NONREACTIVE
Hepatitis B Surface Ag: NONREACTIVE
Hepatitis C Ab: NONREACTIVE

## 2023-02-09 LAB — ANTI-SMOOTH MUSCLE ANTIBODY, IGG: Actin (Smooth Muscle) Antibody (IGG): <20 U (ref <20)

## 2023-02-12 ENCOUNTER — Telehealth (INDEPENDENT_AMBULATORY_CARE_PROVIDER_SITE_OTHER): Payer: Self-pay | Admitting: Gastroenterology

## 2023-02-12 NOTE — Telephone Encounter (Signed)
Pt states that the new medication he was placed on, Budesonide 3mg , gave him loose stool when he went to 2 capsules. Pt states he was taking 3 capsules per day and was told to decrease to 2 per day. Pt states when he began to take 2 capsules a day he began to have loose stool, not diarrhea just loose stool. Pt states he went back to taking 3 capsules daily and after a bout a week his stools improved. Pt wanted to make the doctor aware. Please advise. Thank you.

## 2023-02-19 NOTE — Telephone Encounter (Signed)
He was supposed to decrease the dose of budesonide to 2 pills/day 60 days after starting the medication.  I do not quite understand why he decreased the dose at this point as he has been on the medication for a little more than 30 days. He should continue with the 3 pills every day for total of 60 days and then decrease to 2 pills every day for 60 days, followed by 1 pill for 60 days.  Hi Mitzie,  Can you please schedule a follow up appointment for this patient in 4-8 weeks with me or any of the APPs?  Thanks,  Katrinka Blazing, MD Gastroenterology and Hepatology [facility]

## 2023-02-20 NOTE — Telephone Encounter (Signed)
I called and left a message asked that the patient please return call to the office. 

## 2023-02-22 DIAGNOSIS — Z6835 Body mass index (BMI) 35.0-35.9, adult: Secondary | ICD-10-CM | POA: Diagnosis not present

## 2023-02-22 DIAGNOSIS — R030 Elevated blood-pressure reading, without diagnosis of hypertension: Secondary | ICD-10-CM | POA: Diagnosis not present

## 2023-02-22 DIAGNOSIS — Z79899 Other long term (current) drug therapy: Secondary | ICD-10-CM | POA: Diagnosis not present

## 2023-02-22 DIAGNOSIS — M545 Low back pain, unspecified: Secondary | ICD-10-CM | POA: Diagnosis not present

## 2023-02-22 DIAGNOSIS — E559 Vitamin D deficiency, unspecified: Secondary | ICD-10-CM | POA: Diagnosis not present

## 2023-02-22 DIAGNOSIS — M5136 Other intervertebral disc degeneration, lumbar region: Secondary | ICD-10-CM | POA: Diagnosis not present

## 2023-02-22 DIAGNOSIS — G894 Chronic pain syndrome: Secondary | ICD-10-CM | POA: Diagnosis not present

## 2023-02-22 DIAGNOSIS — E1140 Type 2 diabetes mellitus with diabetic neuropathy, unspecified: Secondary | ICD-10-CM | POA: Diagnosis not present

## 2023-02-22 DIAGNOSIS — E119 Type 2 diabetes mellitus without complications: Secondary | ICD-10-CM | POA: Diagnosis not present

## 2023-02-22 NOTE — Telephone Encounter (Signed)
I called Marchelle Folks at [facility] and She will discontinue all other orders. She took a new script from me stating patient to take 9 mg per day for 7 days, then patient to start taking 6 mg daily for 60 day, then patient to start taking 3 mg per day for 60 days then quit.    So the patient  would use 21 tablets for the first week Then decrease and would use 120 tablets for 60 days, Then decrease and would use 60 tablets for 60 days Then stop. Total of 201 subtract the 67 patient already has and it equals #134 that they will dispense with the above directions.

## 2023-02-22 NOTE — Telephone Encounter (Signed)
So we should tell him to take for 1 week the 9 mg dosing (3 tablets) and then go down to 6 mg for 60 days, followed by 3 mg daily for 60 days.  He should follow these instructions compliantly.  Please check with the pharmacy if the prescription sent on 01/11/2023 is still valid and if so he should pick up the refills from the pharmacy and not request refills from our office. Please also asked the patient to notify us if he notices any worsening diarrhea when going down on the dose.

## 2023-02-22 NOTE — Telephone Encounter (Signed)
I spoke with the Patient he says he is currently for the last three weeks taking 3 tablets per day (9 mg) per day. He says he has 67 tablets left.  On 12/18/2022 per Marchelle Folks at [facility] patient has a Rx on file for take three tablets (9mg ) # 30 day supply with one refill left on this prescription, patient never picked up the refill. They will need to have it cancelled if no longer needing that one refill.  But the also have a prescription from 01/11/2023 with the directions of take 2 po for 60 days, then decrease to one per day.  Should we tell the patient to continue the three for an additional week since he had taken the one refill of (9mg ) per day, then decrease to the 2 per day (6mg ) per day after that one week. Take 6 mg per day for 60 days then 3 mg for 60 days. I know this is confusing call me or tell me at the office if you don't follow what I am trying to convey. Thanks

## 2023-02-22 NOTE — Telephone Encounter (Signed)
Tried calling patient to discuss, no answer. I have left a message and asked that the patient please return call.

## 2023-02-23 NOTE — Telephone Encounter (Signed)
I called and left a detailed message asked that patient call me back to confirm the message directions.

## 2023-02-27 ENCOUNTER — Ambulatory Visit (HOSPITAL_COMMUNITY): Payer: Medicare Other

## 2023-02-27 NOTE — Telephone Encounter (Signed)
I called and left a message asked that the patient please return my call.

## 2023-02-28 ENCOUNTER — Ambulatory Visit (HOSPITAL_COMMUNITY)
Admission: RE | Admit: 2023-02-28 | Discharge: 2023-02-28 | Disposition: A | Discharge status: Home / Self Care | Payer: Medicare Other | Attending: Gastroenterology | Admitting: Gastroenterology

## 2023-02-28 ENCOUNTER — Ambulatory Visit (HOSPITAL_COMMUNITY): Payer: Medicare Other | Admitting: Anesthesiology

## 2023-02-28 ENCOUNTER — Other Ambulatory Visit: Payer: Self-pay

## 2023-02-28 ENCOUNTER — Ambulatory Visit (HOSPITAL_BASED_OUTPATIENT_CLINIC_OR_DEPARTMENT_OTHER): Payer: Medicare Other | Admitting: Anesthesiology

## 2023-02-28 ENCOUNTER — Encounter (HOSPITAL_COMMUNITY)
Admission: RE | Disposition: A | Discharge status: Home / Self Care | Payer: Self-pay | Source: Home / Self Care | Attending: Gastroenterology

## 2023-02-28 ENCOUNTER — Encounter (HOSPITAL_COMMUNITY): Payer: Self-pay | Admitting: Gastroenterology

## 2023-02-28 DIAGNOSIS — Z08 Encounter for follow-up examination after completed treatment for malignant neoplasm: Secondary | ICD-10-CM | POA: Diagnosis not present

## 2023-02-28 DIAGNOSIS — Z7984 Long term (current) use of oral hypoglycemic drugs: Secondary | ICD-10-CM | POA: Diagnosis not present

## 2023-02-28 DIAGNOSIS — Z85038 Personal history of other malignant neoplasm of large intestine: Secondary | ICD-10-CM

## 2023-02-28 DIAGNOSIS — D123 Benign neoplasm of transverse colon: Secondary | ICD-10-CM

## 2023-02-28 DIAGNOSIS — K5730 Diverticulosis of large intestine without perforation or abscess without bleeding: Secondary | ICD-10-CM | POA: Diagnosis not present

## 2023-02-28 DIAGNOSIS — Z85048 Personal history of other malignant neoplasm of rectum, rectosigmoid junction, and anus: Secondary | ICD-10-CM | POA: Insufficient documentation

## 2023-02-28 DIAGNOSIS — I10 Essential (primary) hypertension: Secondary | ICD-10-CM

## 2023-02-28 DIAGNOSIS — K6389 Other specified diseases of intestine: Secondary | ICD-10-CM

## 2023-02-28 DIAGNOSIS — K635 Polyp of colon: Secondary | ICD-10-CM

## 2023-02-28 DIAGNOSIS — F419 Anxiety disorder, unspecified: Secondary | ICD-10-CM | POA: Diagnosis not present

## 2023-02-28 DIAGNOSIS — M109 Gout, unspecified: Secondary | ICD-10-CM | POA: Insufficient documentation

## 2023-02-28 DIAGNOSIS — D124 Benign neoplasm of descending colon: Secondary | ICD-10-CM

## 2023-02-28 DIAGNOSIS — Z79899 Other long term (current) drug therapy: Secondary | ICD-10-CM | POA: Insufficient documentation

## 2023-02-28 DIAGNOSIS — Z1211 Encounter for screening for malignant neoplasm of colon: Secondary | ICD-10-CM | POA: Diagnosis not present

## 2023-02-28 DIAGNOSIS — C20 Malignant neoplasm of rectum: Secondary | ICD-10-CM

## 2023-02-28 DIAGNOSIS — G4730 Sleep apnea, unspecified: Secondary | ICD-10-CM | POA: Diagnosis not present

## 2023-02-28 DIAGNOSIS — K6289 Other specified diseases of anus and rectum: Secondary | ICD-10-CM | POA: Diagnosis not present

## 2023-02-28 DIAGNOSIS — E785 Hyperlipidemia, unspecified: Secondary | ICD-10-CM | POA: Diagnosis not present

## 2023-02-28 DIAGNOSIS — E1140 Type 2 diabetes mellitus with diabetic neuropathy, unspecified: Secondary | ICD-10-CM | POA: Diagnosis not present

## 2023-02-28 HISTORY — PX: POLYPECTOMY: SHX5525

## 2023-02-28 HISTORY — PX: COLONOSCOPY WITH PROPOFOL: SHX5780

## 2023-02-28 HISTORY — PX: BIOPSY: SHX5522

## 2023-02-28 LAB — HM COLONOSCOPY

## 2023-02-28 LAB — GLUCOSE, CAPILLARY: Glucose-Capillary: 175 mg/dL — ABNORMAL HIGH (ref 70–99)

## 2023-02-28 SURGERY — COLONOSCOPY WITH PROPOFOL
Anesthesia: General

## 2023-02-28 MED ORDER — LACTATED RINGERS IV SOLN: INTRAVENOUS | Status: DC

## 2023-02-28 MED ORDER — PROPOFOL 10 MG/ML IV BOLUS
INTRAVENOUS | Status: DC | PRN
  Administered 2023-02-28: 100 mg via INTRAVENOUS

## 2023-02-28 MED ORDER — LIDOCAINE HCL (CARDIAC) PF 100 MG/5ML IV SOSY
PREFILLED_SYRINGE | INTRAVENOUS | Status: DC | PRN
  Administered 2023-02-28: 80 mg via INTRAVENOUS

## 2023-02-28 MED ORDER — LIDOCAINE HCL (PF) 2 % IJ SOLN
INTRAMUSCULAR | Status: AC
  Filled 2023-02-28: qty 5

## 2023-02-28 MED ORDER — PROPOFOL 500 MG/50ML IV EMUL
INTRAVENOUS | Status: DC | PRN
  Administered 2023-02-28: 175 ug/kg/min via INTRAVENOUS

## 2023-02-28 NOTE — Transfer of Care (Addendum)
02/28/23   0934Immediate Anesthesia Transfer of Care Note  Patient: Louis House  Procedure(s) Performed: COLONOSCOPY WITH PROPOFOL POLYPECTOMY BIOPSY  Patient Location: PACU  Anesthesia Type:General  Level of Consciousness: drowsy and patient cooperative  Airway & Oxygen Therapy: Patient Spontanous Breathing  Post-op Assessment: Report given to RN and Post -op Vital signs reviewed and stable  Post vital signs: Reviewed and stable  Last Vitals:  Vitals Value Taken Time  BP 153/89 02/28/23    0934  Temp 36.5 02/28/23    0934  Pulse 77 02/28/23    0934  Resp 17 02/28/23    0934  SpO2 100% 02/28/23    0934    Last Pain:  Vitals:   02/28/23 0859  TempSrc:   PainSc: 0-No pain      Patients Stated Pain Goal: 4 (02/28/23 0742)  Complications: No notable events documented.

## 2023-02-28 NOTE — H&P (Signed)
Louis House is an 67 y.o. male.   Chief Complaint: history of rectal cancer HPI: Louis House is a 67 y.o. male with past medical history of rectal adenocarcinoma - Stage IIIB (cT2, cN2a, cM0), collagenous colitis, diabetes, hypertension, gout, hyperlipidemia and anxiety , who presents for follow up of rectal cancer.  Patient states that his stools became softer after going down on budesonide 6 mg every day and now he is on 9 mg daily.  Denies any nausea, vomiting, fever, chills, melena or hematochezia.  Past Medical History:  Diagnosis Date   Anxiety    Diabetes (HCC) 01/12/2017   Diarrhea 02/21/2017   Essential hypertension, benign 01/12/2017   Gout    High cholesterol 01/12/2017   Neuropathy    Port-A-Cath in place 07/28/2021   Sleep apnea    Vertigo     Past Surgical History:  Procedure Laterality Date   BIOPSY  04/06/2017   Procedure: BIOPSY;  Surgeon: Malissa Hippo, MD;  [facility];  Service: Endoscopy;;  colon   BIOPSY  04/12/2021   Procedure: BIOPSY;  Surgeon: Marguerita Merles, Reuel Boom, MD;  [facility];  Service: Gastroenterology;;  nodular area in ascending colon biopsy random colon bx   BIOPSY  02/03/2022   Procedure: BIOPSY;  Surgeon: Romie Levee, MD;  [facility];  Service: Endoscopy;;   bone spurs     CATARACT EXTRACTION W/PHACO Left 04/22/2018   Procedure: CATARACT EXTRACTION PHACO AND INTRAOCULAR LENS PLACEMENT LEFT EYE;  Surgeon: Gemma Payor, MD;  [facility];  Service: Ophthalmology;  Laterality: Left;  CDE: 7.58   COLONOSCOPY WITH PROPOFOL N/A 04/06/2017   Procedure: COLONOSCOPY WITH PROPOFOL;  Surgeon: Malissa Hippo, MD;  [facility];  Service: Endoscopy;  Laterality: N/A;  1:45   COLONOSCOPY WITH PROPOFOL N/A 04/12/2021   Procedure: COLONOSCOPY WITH PROPOFOL;  Surgeon: Dolores Frame, MD;  [facility];  Service: Gastroenterology;  Laterality: N/A;  9:05   FLEXIBLE SIGMOIDOSCOPY  N/A 05/10/2021   Procedure: FLEXIBLE SIGMOIDOSCOPY;  Surgeon: Dolores Frame, MD;  [facility];  Service: Gastroenterology;  Laterality: N/A;  9:50 ASA 1 Per Dr C no Anesthesia, no PAT - ok per Theressa Stamps SIGMOIDOSCOPY N/A 02/03/2022   Procedure: FLEXIBLE SIGMOIDOSCOPY;  Surgeon: Romie Levee, MD;  [facility];  Service: Endoscopy;  Laterality: N/A;   kneee arthroscopy     both knees   Left shoulder surgery from injury     PORTACATH PLACEMENT Left 08/08/2021   Procedure: INSERTION PORT-A-CATH;  Surgeon: Franky Macho, MD;  Location: [facility];  Service: General;  Laterality: Left;   Umblical hernia      History reviewed. No pertinent family history. Social History:  reports that he has never smoked. He has never been exposed to tobacco smoke. He has never used smokeless tobacco. He reports that he does not drink alcohol and does not use drugs.  Allergies: No Known Allergies  Facility-Administered Medications Prior to Admission  Medication Dose Route Frequency Provider Last Rate Last Admin   dicyclomine (BENTYL) tablet 20 mg  20 mg Oral TID AC & HS Setzer, Terri L, NP       Medications Prior to Admission  Medication Sig Dispense Refill   allopurinol (ZYLOPRIM) 300 MG tablet Take 300 mg by mouth in the morning.     amLODipine (NORVASC) 5 MG tablet Take 5 mg by mouth in the morning.     atorvastatin (LIPITOR) 40  MG tablet Take 40 mg by mouth every evening.     budesonide (ENTOCORT EC) 3 MG 24 hr capsule Take 2 capsules (6 mg total) by mouth daily for 60 days, THEN 1 capsule (3 mg total) daily. (Patient taking differently: Take 3 capsules (9 mg) by mouth once daily ) 180 capsule 0   busPIRone (BUSPAR) 15 MG tablet Take 15 mg by mouth 2 (two) times daily.     D3 2000 50 MCG (2000 UT) CAPS Take 2,000 Units by mouth in the morning.     diclofenac (VOLTAREN) 75 MG EC tablet Take 75 mg by mouth 2 (two) times daily.     gabapentin (NEURONTIN) 600 MG tablet  Take 600 mg by mouth 3 (three) times daily.     HYDROcodone-acetaminophen (NORCO) 10-325 MG tablet Take 1 tablet by mouth 3 (three) times daily as needed (pain.).     loratadine (CLARITIN) 10 MG tablet Take 10 mg by mouth in the morning.     METHYL B-12 5000 MCG CHEW Chew 5,000 mcg by mouth in the morning.     metoprolol succinate (TOPROL-XL) 100 MG 24 hr tablet Take 100 mg by mouth in the morning.     Naphazoline-Pheniramine (OPCON-A) 0.027-0.315 % SOLN Place 1 drop into both eyes in the morning.     olmesartan (BENICAR) 20 MG tablet Take 20 mg by mouth in the morning.     Omega-3 Fatty Acids (OMEGA-3 FISH OIL PO) Take 3-4 capsules by mouth in the morning.     oxymetazoline (AFRIN) 0.05 % nasal spray Place 1 spray into both nostrils 2 (two) times daily.     traZODone (DESYREL) 50 MG tablet Take 50 mg by mouth at bedtime.     diphenoxylate-atropine (LOMOTIL) 2.5-0.025 MG tablet Take 1 tablet by mouth 4 (four) times daily as needed for diarrhea or loose stools. (Patient not taking: Reported on 01/11/2023) 60 tablet 2   meclizine (ANTIVERT) 25 MG tablet Take 25 mg by mouth 3 (three) times daily as needed for dizziness (for flare up of inner ear issues.).      Results for orders placed or performed during the hospital encounter of 02/28/23 (from the past 48 hour(s))  Glucose, capillary     Status: Abnormal   Collection Time: 02/28/23  7:40 AM  Result Value Ref Range   Glucose-Capillary 175 (H) 70 - 99 mg/dL    Comment: Glucose reference range applies only to samples taken after fasting for at least 8 hours.   No results found.  Review of Systems  All other systems reviewed and are negative.   Blood pressure (!) 172/99, pulse (!) 58, temperature 98.9 F (37.2 C), temperature source Oral, resp. rate 13, height 6' (1.829 m), weight 120.2 kg, SpO2 98 %. Physical Exam  GENERAL: The patient is AO x3, in no acute distress. HEENT: Head is normocephalic and atraumatic. EOMI are intact. Mouth is well  hydrated and without lesions. NECK: Supple. No masses LUNGS: Clear to auscultation. No presence of rhonchi/wheezing/rales. Adequate chest expansion HEART: RRR, normal s1 and s2. ABDOMEN: Soft, nontender, no guarding, no peritoneal signs, and nondistended. BS +. No masses. EXTREMITIES: Without any cyanosis, clubbing, rash, lesions or edema. NEUROLOGIC: AOx3, no focal motor deficit. SKIN: no jaundice, no rashes  Assessment/Plan Louis House is a 67 y.o. male with past medical history of rectal adenocarcinoma - Stage IIIB (cT2, cN2a, cM0), collagenous colitis, diabetes, hypertension, gout, hyperlipidemia and anxiety , who presents for follow up of rectal cancer.  Will proceed with colonoscopy.  Dolores Frame, MD 02/28/2023, 8:47 AM

## 2023-02-28 NOTE — Telephone Encounter (Signed)
Ok, thanks! Was he aware of the messages we had left regarding Budesonide?

## 2023-02-28 NOTE — Telephone Encounter (Signed)
Patient not responding to messages left on vm.

## 2023-02-28 NOTE — Op Note (Signed)
[facility] Patient Name: Louis House Procedure Date: 02/28/2023 8:42 AM MRN: [id number] Date of Birth: 24-Oct-1955 Attending MD: Katrinka Blazing , , [id number] CSN: [id number] Age: 67 Admit Type: Outpatient Procedure:                Colonoscopy Indications:              High risk colon cancer surveillance: Personal                            history of colon cancer Providers:                Katrinka Blazing, Sheran Fava, Dyann Ruddle Referring MD:              Medicines:                Monitored Anesthesia Care Complications:            No immediate complications. Estimated Blood Loss:     Estimated blood loss: none. Procedure:                Pre-Anesthesia Assessment:                           - Prior to the procedure, a History and Physical                            was performed, and patient medications, allergies                            and sensitivities were reviewed. The patient's                            tolerance of previous anesthesia was reviewed.                           - The risks and benefits of the procedure and the                            sedation options and risks were discussed with the                            patient. All questions were answered and informed                            consent was obtained.                           - ASA Grade Assessment: II - A patient with mild                            systemic disease.                           After obtaining informed consent, the colonoscope                            was passed under direct vision. Throughout the  procedure, the patient's blood pressure, pulse, and                            oxygen saturations were monitored continuously. The                            PCF-HQ190L ([id number]) scope was introduced through                            the anus and advanced to the the cecum, identified                            by appendiceal orifice and  ileocecal valve. The                            colonoscopy was performed without difficulty. The                            patient tolerated the procedure well. The quality                            of the bowel preparation was adequate. Scope In: 9:03:52 AM Scope Out: 9:28:22 AM Scope Withdrawal Time: 0 hours 16 minutes 17 seconds  Total Procedure Duration: 0 hours 24 minutes 30 seconds  Findings:      The perianal and digital rectal examinations were normal.      A 5 mm polyp was found in the transverse colon. The polyp was sessile.       The polyp was removed with a cold snare. Resection and retrieval were       complete.      Two sessile polyps were found in the descending colon. The polyps were 2       to 5 mm in size. These polyps were removed with a cold snare. Resection       and retrieval were complete.      Scattered large-mouthed and small-mouthed diverticula were found in the       entire colon.      A 10 to 15 mm scar was found at 5 cm proximal to the anus. The scar       tissue was healthy in appearance. Imaging was performed using white       light and narrow band imaging to visualize the mucosa. Biopsies were       taken with a cold forceps for histology.      Two tattoos were seen in the rectum, just proximal and distal to the       scar. The tattoo site appeared normal.      The retroflexed view of the distal rectum and anal verge was normal and       showed no anal or rectal abnormalities. Impression:               - One 5 mm polyp in the transverse colon, removed                            with a cold snare. Resected and retrieved.                           -  Two 2 to 5 mm polyps in the descending colon,                            removed with a cold snare. Resected and retrieved.                           - Diverticulosis in the entire examined colon.                           - Scar at 5 cm proximal to the anus. Biopsied.                           - A tattoo  was seen in the rectum. The tattoo site                            appeared normal.                           - The distal rectum and anal verge are normal on                            retroflexion view. Moderate Sedation:      Per Anesthesia Care Recommendation:           - Discharge patient to home (ambulatory).                           - Resume previous diet.                           - Await pathology results.                           - Repeat colonoscopy in 3 years for surveillance.                           - Follow up with Dr. Maisie Fus regarding surveillance                            flexible sigmoidoscopy - should be every 4 months                            for 2 years per previous recs.                           - Continue budesonide 9 mg , taper it as directed.                            Please let us know if diarrhea worsens when                            lowering the dosage. Procedure Code(s):        --- Professional ---  27253, Colonoscopy, flexible; with removal of                            tumor(s), polyp(s), or other lesion(s) by snare                            technique                           45380, 59, Colonoscopy, flexible; with biopsy,                            single or multiple Diagnosis Code(s):        --- Professional ---                           G64.403, Personal history of other malignant                            neoplasm of large intestine                           D12.3, Benign neoplasm of transverse colon (hepatic                            flexure or splenic flexure)                           D12.4, Benign neoplasm of descending colon                           K63.89, Other specified diseases of intestine                           K57.30, Diverticulosis of large intestine without                            perforation or abscess without bleeding CPT copyright 2022 American [employer] Association. All rights reserved. The  codes documented in this report are preliminary and upon coder review may  be revised to meet current compliance requirements. Katrinka Blazing, MD Katrinka Blazing,  02/28/2023 9:40:02 AM This report has been signed electronically. Number of Addenda: 0

## 2023-02-28 NOTE — Discharge Instructions (Signed)
You are being discharged to home.  Resume your previous diet.  We are waiting for your pathology results.  Your physician has recommended a repeat colonoscopy in three years for surveillance.  Follow up with Dr. Maisie Fus regarding surveillance flexible sigmoidoscopy - should be every 4 months for 2 years per previous recs. Continue budesonide 9 mg , taper it as directed. Please let us know if diarrhea worsens when lowering the dosage.

## 2023-02-28 NOTE — Telephone Encounter (Signed)
I spoke to the patient today - he had his colonoscopy done today

## 2023-02-28 NOTE — Anesthesia Preprocedure Evaluation (Addendum)
Anesthesia Evaluation  Patient identified by MRN, date of birth, ID band Patient awake    Reviewed: Allergy & Precautions, H&P , NPO status , Patient's Chart, lab work & pertinent test results, reviewed documented beta blocker date and time   Airway Mallampati: III  TM Distance: >3 FB Neck ROM: Full    Dental  (+) Missing, Chipped, Dental Advisory Given, Poor Dentition   Pulmonary sleep apnea    Pulmonary exam normal breath sounds clear to auscultation       Cardiovascular hypertension, Pt. on medications and Pt. on home beta blockers Normal cardiovascular exam Rhythm:Regular Rate:Normal     Neuro/Psych  PSYCHIATRIC DISORDERS Anxiety      Neuromuscular disease    GI/Hepatic Bowel prep,,,(+) Hepatitis - (NASH)Rectal cancer   Endo/Other  diabetes, Well Controlled, Type 2, Oral Hypoglycemic Agents    Renal/GU negative Renal ROS  negative genitourinary   Musculoskeletal  (+) Arthritis  (GOUT),    Abdominal   Peds negative pediatric ROS (+)  Hematology negative hematology ROS (+)   Anesthesia Other Findings   Reproductive/Obstetrics negative OB ROS                             Anesthesia Physical Anesthesia Plan  ASA: 3  Anesthesia Plan: General   Post-op Pain Management: Minimal or no pain anticipated   Induction: Intravenous  PONV Risk Score and Plan: 1 and Treatment may vary due to age or medical condition and Propofol infusion  Airway Management Planned: Nasal Cannula, Natural Airway and Simple Face Mask  Additional Equipment:   Intra-op Plan:   Post-operative Plan:   Informed Consent: I have reviewed the patients History and Physical, chart, labs and discussed the procedure including the risks, benefits and alternatives for the proposed anesthesia with the patient or authorized representative who has indicated his/her understanding and acceptance.     Dental advisory  given  Plan Discussed with: CRNA and Surgeon  Anesthesia Plan Comments:        Anesthesia Quick Evaluation

## 2023-02-28 NOTE — Telephone Encounter (Signed)
Noted  

## 2023-02-28 NOTE — Telephone Encounter (Signed)
He appeared to be aware, when I told him about the 60 days he said he knew about it

## 2023-02-28 NOTE — Anesthesia Postprocedure Evaluation (Signed)
Anesthesia Post Note  Patient: Louis House  Procedure(s) Performed: COLONOSCOPY WITH PROPOFOL POLYPECTOMY BIOPSY  Patient location during evaluation: Phase II Anesthesia Type: General Level of consciousness: awake and alert and oriented Pain management: pain level controlled Vital Signs Assessment: post-procedure vital signs reviewed and stable Respiratory status: spontaneous breathing, nonlabored ventilation and respiratory function stable Cardiovascular status: blood pressure returned to baseline and stable Postop Assessment: no apparent nausea or vomiting Anesthetic complications: no  No notable events documented.   Last Vitals:  Vitals:   02/28/23 0754 02/28/23 0934  BP: (!) 172/99 (!) 153/89  Pulse: (!) 58 77  Resp: 13 17  Temp: 37.2 C 36.5 C  SpO2: 98% 100%    Last Pain:  Vitals:   02/28/23 0942  TempSrc:   PainSc: 3                  Jadiel Schmieder C Shavonte Zhao

## 2023-03-04 LAB — SURGICAL PATHOLOGY

## 2023-03-05 ENCOUNTER — Ambulatory Visit (HOSPITAL_COMMUNITY): Payer: Medicare Other | Attending: Gastroenterology

## 2023-03-05 ENCOUNTER — Encounter (HOSPITAL_COMMUNITY): Payer: Self-pay | Admitting: Gastroenterology

## 2023-03-05 ENCOUNTER — Encounter (HOSPITAL_COMMUNITY): Payer: Self-pay

## 2023-03-07 DIAGNOSIS — N401 Enlarged prostate with lower urinary tract symptoms: Secondary | ICD-10-CM | POA: Diagnosis not present

## 2023-03-07 DIAGNOSIS — I10 Essential (primary) hypertension: Secondary | ICD-10-CM | POA: Diagnosis not present

## 2023-03-07 DIAGNOSIS — E11610 Type 2 diabetes mellitus with diabetic neuropathic arthropathy: Secondary | ICD-10-CM | POA: Diagnosis not present

## 2023-03-07 DIAGNOSIS — N182 Chronic kidney disease, stage 2 (mild): Secondary | ICD-10-CM | POA: Diagnosis not present

## 2023-03-07 DIAGNOSIS — G609 Hereditary and idiopathic neuropathy, unspecified: Secondary | ICD-10-CM | POA: Diagnosis not present

## 2023-03-07 DIAGNOSIS — M1009 Idiopathic gout, multiple sites: Secondary | ICD-10-CM | POA: Diagnosis not present

## 2023-03-07 DIAGNOSIS — M1049 Other secondary gout, multiple sites: Secondary | ICD-10-CM | POA: Diagnosis not present

## 2023-03-09 ENCOUNTER — Encounter (INDEPENDENT_AMBULATORY_CARE_PROVIDER_SITE_OTHER): Payer: Self-pay | Admitting: *Deleted

## 2023-03-09 DIAGNOSIS — M545 Low back pain, unspecified: Secondary | ICD-10-CM | POA: Diagnosis not present

## 2023-03-09 DIAGNOSIS — M6281 Muscle weakness (generalized): Secondary | ICD-10-CM | POA: Diagnosis not present

## 2023-03-12 ENCOUNTER — Telehealth (INDEPENDENT_AMBULATORY_CARE_PROVIDER_SITE_OTHER): Payer: Self-pay | Admitting: Gastroenterology

## 2023-03-12 NOTE — Telephone Encounter (Signed)
02/22/2023: Tried calling patient multiple time and sent my chart message, patient never responded, then patient had procedure and per Dr. Levon Hedger the patient was aware of the way the medication needed to be taken at that time. Please seen 02/12/2023 note.   I called Marchelle Folks at [facility] and She will discontinue all other orders. She took a new script from me stating patient to take 9 mg per day for 7 days, then patient to start taking 6 mg daily for 60 day, then patient to start taking 3 mg per day for 60 days then quit.     So the patient  would use 21 tablets for the first week Then decrease and would use 120 tablets for 60 days, Then decrease and would use 60 tablets for 60 days Then stop. Total of 201 subtract the 67 patient already has and it equals #134 that they will dispense with the above directions.

## 2023-03-12 NOTE — Telephone Encounter (Signed)
Pt called in and states he is needing a refill on his Budesonide capsules. States he has about 3 days worth left. He is currently taking 3 capsules daily. Please advise. Thank you.  [facility]

## 2023-03-12 NOTE — Telephone Encounter (Signed)
I have called again today and let the patient know the medication was at the pharmacy and he needed to call them. He did not pick up, but I left a vm to call the drug store for more information.

## 2023-03-12 NOTE — Telephone Encounter (Signed)
I sent a refill in May 2nd to [facility], please ask him to call them

## 2023-03-13 ENCOUNTER — Encounter (INDEPENDENT_AMBULATORY_CARE_PROVIDER_SITE_OTHER): Payer: Self-pay

## 2023-03-13 DIAGNOSIS — M545 Low back pain, unspecified: Secondary | ICD-10-CM | POA: Diagnosis not present

## 2023-03-13 DIAGNOSIS — M6281 Muscle weakness (generalized): Secondary | ICD-10-CM | POA: Diagnosis not present

## 2023-03-13 NOTE — Telephone Encounter (Signed)
I have sent the patient a message via My Chart regarding the need for him to pick up the medication from [facility].

## 2023-03-16 DIAGNOSIS — M545 Low back pain, unspecified: Secondary | ICD-10-CM | POA: Diagnosis not present

## 2023-03-16 DIAGNOSIS — M6281 Muscle weakness (generalized): Secondary | ICD-10-CM | POA: Diagnosis not present

## 2023-03-22 DIAGNOSIS — M6281 Muscle weakness (generalized): Secondary | ICD-10-CM | POA: Diagnosis not present

## 2023-03-22 DIAGNOSIS — M545 Low back pain, unspecified: Secondary | ICD-10-CM | POA: Diagnosis not present

## 2023-03-23 DIAGNOSIS — M5136 Other intervertebral disc degeneration, lumbar region: Secondary | ICD-10-CM | POA: Diagnosis not present

## 2023-03-23 DIAGNOSIS — E119 Type 2 diabetes mellitus without complications: Secondary | ICD-10-CM | POA: Diagnosis not present

## 2023-03-23 DIAGNOSIS — G894 Chronic pain syndrome: Secondary | ICD-10-CM | POA: Diagnosis not present

## 2023-03-23 DIAGNOSIS — E559 Vitamin D deficiency, unspecified: Secondary | ICD-10-CM | POA: Diagnosis not present

## 2023-03-23 DIAGNOSIS — Z6836 Body mass index (BMI) 36.0-36.9, adult: Secondary | ICD-10-CM | POA: Diagnosis not present

## 2023-03-23 DIAGNOSIS — R030 Elevated blood-pressure reading, without diagnosis of hypertension: Secondary | ICD-10-CM | POA: Diagnosis not present

## 2023-03-23 DIAGNOSIS — M545 Low back pain, unspecified: Secondary | ICD-10-CM | POA: Diagnosis not present

## 2023-03-23 DIAGNOSIS — E1140 Type 2 diabetes mellitus with diabetic neuropathy, unspecified: Secondary | ICD-10-CM | POA: Diagnosis not present

## 2023-03-23 DIAGNOSIS — Z79899 Other long term (current) drug therapy: Secondary | ICD-10-CM | POA: Diagnosis not present

## 2023-03-28 DIAGNOSIS — M545 Low back pain, unspecified: Secondary | ICD-10-CM | POA: Diagnosis not present

## 2023-03-28 DIAGNOSIS — M6281 Muscle weakness (generalized): Secondary | ICD-10-CM | POA: Diagnosis not present

## 2023-04-02 DIAGNOSIS — M6281 Muscle weakness (generalized): Secondary | ICD-10-CM | POA: Diagnosis not present

## 2023-04-02 DIAGNOSIS — M545 Low back pain, unspecified: Secondary | ICD-10-CM | POA: Diagnosis not present

## 2023-04-04 DIAGNOSIS — M545 Low back pain, unspecified: Secondary | ICD-10-CM | POA: Diagnosis not present

## 2023-04-04 DIAGNOSIS — M6281 Muscle weakness (generalized): Secondary | ICD-10-CM | POA: Diagnosis not present

## 2023-04-10 DIAGNOSIS — M545 Low back pain, unspecified: Secondary | ICD-10-CM | POA: Diagnosis not present

## 2023-04-10 DIAGNOSIS — M6281 Muscle weakness (generalized): Secondary | ICD-10-CM | POA: Diagnosis not present

## 2023-04-12 DIAGNOSIS — M545 Low back pain, unspecified: Secondary | ICD-10-CM | POA: Diagnosis not present

## 2023-04-12 DIAGNOSIS — M6281 Muscle weakness (generalized): Secondary | ICD-10-CM | POA: Diagnosis not present

## 2023-04-16 DIAGNOSIS — M6281 Muscle weakness (generalized): Secondary | ICD-10-CM | POA: Diagnosis not present

## 2023-04-16 DIAGNOSIS — M545 Low back pain, unspecified: Secondary | ICD-10-CM | POA: Diagnosis not present

## 2023-04-18 DIAGNOSIS — M545 Low back pain, unspecified: Secondary | ICD-10-CM | POA: Diagnosis not present

## 2023-04-18 DIAGNOSIS — M6281 Muscle weakness (generalized): Secondary | ICD-10-CM | POA: Diagnosis not present

## 2023-04-19 DIAGNOSIS — E1140 Type 2 diabetes mellitus with diabetic neuropathy, unspecified: Secondary | ICD-10-CM | POA: Diagnosis not present

## 2023-04-19 DIAGNOSIS — E559 Vitamin D deficiency, unspecified: Secondary | ICD-10-CM | POA: Diagnosis not present

## 2023-04-19 DIAGNOSIS — E6609 Other obesity due to excess calories: Secondary | ICD-10-CM | POA: Diagnosis not present

## 2023-04-19 DIAGNOSIS — R030 Elevated blood-pressure reading, without diagnosis of hypertension: Secondary | ICD-10-CM | POA: Diagnosis not present

## 2023-04-19 DIAGNOSIS — M5136 Other intervertebral disc degeneration, lumbar region: Secondary | ICD-10-CM | POA: Diagnosis not present

## 2023-04-19 DIAGNOSIS — Z79899 Other long term (current) drug therapy: Secondary | ICD-10-CM | POA: Diagnosis not present

## 2023-04-19 DIAGNOSIS — M545 Low back pain, unspecified: Secondary | ICD-10-CM | POA: Diagnosis not present

## 2023-04-19 DIAGNOSIS — E119 Type 2 diabetes mellitus without complications: Secondary | ICD-10-CM | POA: Diagnosis not present

## 2023-04-19 DIAGNOSIS — Z6837 Body mass index (BMI) 37.0-37.9, adult: Secondary | ICD-10-CM | POA: Diagnosis not present

## 2023-04-19 DIAGNOSIS — G894 Chronic pain syndrome: Secondary | ICD-10-CM | POA: Diagnosis not present

## 2023-05-02 DIAGNOSIS — H6522 Chronic serous otitis media, left ear: Secondary | ICD-10-CM | POA: Diagnosis not present

## 2023-05-02 DIAGNOSIS — J310 Chronic rhinitis: Secondary | ICD-10-CM | POA: Diagnosis not present

## 2023-05-02 DIAGNOSIS — H6982 Other specified disorders of Eustachian tube, left ear: Secondary | ICD-10-CM | POA: Diagnosis not present

## 2023-05-02 DIAGNOSIS — H9012 Conductive hearing loss, unilateral, left ear, with unrestricted hearing on the contralateral side: Secondary | ICD-10-CM | POA: Diagnosis not present

## 2023-05-02 DIAGNOSIS — H6122 Impacted cerumen, left ear: Secondary | ICD-10-CM | POA: Diagnosis not present

## 2023-05-02 DIAGNOSIS — J343 Hypertrophy of nasal turbinates: Secondary | ICD-10-CM | POA: Diagnosis not present

## 2023-05-08 DIAGNOSIS — H6982 Other specified disorders of Eustachian tube, left ear: Secondary | ICD-10-CM | POA: Diagnosis not present

## 2023-05-08 DIAGNOSIS — H6522 Chronic serous otitis media, left ear: Secondary | ICD-10-CM | POA: Diagnosis not present

## 2023-05-08 DIAGNOSIS — H9012 Conductive hearing loss, unilateral, left ear, with unrestricted hearing on the contralateral side: Secondary | ICD-10-CM | POA: Diagnosis not present

## 2023-05-25 DIAGNOSIS — E119 Type 2 diabetes mellitus without complications: Secondary | ICD-10-CM | POA: Diagnosis not present

## 2023-05-25 DIAGNOSIS — M5136 Other intervertebral disc degeneration, lumbar region: Secondary | ICD-10-CM | POA: Diagnosis not present

## 2023-05-25 DIAGNOSIS — E6609 Other obesity due to excess calories: Secondary | ICD-10-CM | POA: Diagnosis not present

## 2023-05-25 DIAGNOSIS — G894 Chronic pain syndrome: Secondary | ICD-10-CM | POA: Diagnosis not present

## 2023-05-25 DIAGNOSIS — Z6837 Body mass index (BMI) 37.0-37.9, adult: Secondary | ICD-10-CM | POA: Diagnosis not present

## 2023-05-25 DIAGNOSIS — E559 Vitamin D deficiency, unspecified: Secondary | ICD-10-CM | POA: Diagnosis not present

## 2023-05-25 DIAGNOSIS — I10 Essential (primary) hypertension: Secondary | ICD-10-CM | POA: Diagnosis not present

## 2023-05-25 DIAGNOSIS — M545 Low back pain, unspecified: Secondary | ICD-10-CM | POA: Diagnosis not present

## 2023-05-25 DIAGNOSIS — Z79899 Other long term (current) drug therapy: Secondary | ICD-10-CM | POA: Diagnosis not present

## 2023-05-25 DIAGNOSIS — E1140 Type 2 diabetes mellitus with diabetic neuropathy, unspecified: Secondary | ICD-10-CM | POA: Diagnosis not present

## 2023-05-29 DIAGNOSIS — I10 Essential (primary) hypertension: Secondary | ICD-10-CM | POA: Diagnosis not present

## 2023-05-29 DIAGNOSIS — Z1152 Encounter for screening for COVID-19: Secondary | ICD-10-CM | POA: Diagnosis not present

## 2023-05-29 DIAGNOSIS — I959 Hypotension, unspecified: Secondary | ICD-10-CM | POA: Diagnosis not present

## 2023-05-29 DIAGNOSIS — R001 Bradycardia, unspecified: Secondary | ICD-10-CM | POA: Diagnosis not present

## 2023-05-29 DIAGNOSIS — R42 Dizziness and giddiness: Secondary | ICD-10-CM | POA: Diagnosis not present

## 2023-05-29 DIAGNOSIS — R531 Weakness: Secondary | ICD-10-CM | POA: Diagnosis not present

## 2023-05-29 DIAGNOSIS — E7800 Pure hypercholesterolemia, unspecified: Secondary | ICD-10-CM | POA: Diagnosis not present

## 2023-05-29 DIAGNOSIS — I6782 Cerebral ischemia: Secondary | ICD-10-CM | POA: Diagnosis not present

## 2023-05-29 DIAGNOSIS — Z79899 Other long term (current) drug therapy: Secondary | ICD-10-CM | POA: Diagnosis not present

## 2023-05-29 DIAGNOSIS — R079 Chest pain, unspecified: Secondary | ICD-10-CM | POA: Diagnosis not present

## 2023-05-29 DIAGNOSIS — Z5329 Procedure and treatment not carried out because of patient's decision for other reasons: Secondary | ICD-10-CM | POA: Diagnosis not present

## 2023-05-29 DIAGNOSIS — I517 Cardiomegaly: Secondary | ICD-10-CM | POA: Diagnosis not present

## 2023-05-29 DIAGNOSIS — R0602 Shortness of breath: Secondary | ICD-10-CM | POA: Diagnosis not present

## 2023-05-30 ENCOUNTER — Encounter (HOSPITAL_COMMUNITY): Payer: Self-pay

## 2023-05-30 DIAGNOSIS — N401 Enlarged prostate with lower urinary tract symptoms: Secondary | ICD-10-CM | POA: Diagnosis not present

## 2023-05-30 DIAGNOSIS — D012 Carcinoma in situ of rectum: Secondary | ICD-10-CM | POA: Diagnosis not present

## 2023-05-30 DIAGNOSIS — M1009 Idiopathic gout, multiple sites: Secondary | ICD-10-CM | POA: Diagnosis not present

## 2023-05-30 DIAGNOSIS — Z125 Encounter for screening for malignant neoplasm of prostate: Secondary | ICD-10-CM | POA: Diagnosis not present

## 2023-05-30 DIAGNOSIS — I10 Essential (primary) hypertension: Secondary | ICD-10-CM | POA: Diagnosis not present

## 2023-05-30 DIAGNOSIS — N182 Chronic kidney disease, stage 2 (mild): Secondary | ICD-10-CM | POA: Diagnosis not present

## 2023-05-30 DIAGNOSIS — G609 Hereditary and idiopathic neuropathy, unspecified: Secondary | ICD-10-CM | POA: Diagnosis not present

## 2023-05-30 DIAGNOSIS — M1049 Other secondary gout, multiple sites: Secondary | ICD-10-CM | POA: Diagnosis not present

## 2023-05-30 DIAGNOSIS — E11610 Type 2 diabetes mellitus with diabetic neuropathic arthropathy: Secondary | ICD-10-CM | POA: Diagnosis not present

## 2023-05-31 ENCOUNTER — Other Ambulatory Visit (HOSPITAL_COMMUNITY): Payer: Self-pay

## 2023-06-22 DIAGNOSIS — M129 Arthropathy, unspecified: Secondary | ICD-10-CM | POA: Diagnosis not present

## 2023-06-22 DIAGNOSIS — G894 Chronic pain syndrome: Secondary | ICD-10-CM | POA: Diagnosis not present

## 2023-06-22 DIAGNOSIS — E559 Vitamin D deficiency, unspecified: Secondary | ICD-10-CM | POA: Diagnosis not present

## 2023-06-22 DIAGNOSIS — Z79899 Other long term (current) drug therapy: Secondary | ICD-10-CM | POA: Diagnosis not present

## 2023-06-22 DIAGNOSIS — E6609 Other obesity due to excess calories: Secondary | ICD-10-CM | POA: Diagnosis not present

## 2023-06-22 DIAGNOSIS — M51369 Other intervertebral disc degeneration, lumbar region without mention of lumbar back pain or lower extremity pain: Secondary | ICD-10-CM | POA: Diagnosis not present

## 2023-06-22 DIAGNOSIS — E1140 Type 2 diabetes mellitus with diabetic neuropathy, unspecified: Secondary | ICD-10-CM | POA: Diagnosis not present

## 2023-06-22 DIAGNOSIS — Z6837 Body mass index (BMI) 37.0-37.9, adult: Secondary | ICD-10-CM | POA: Diagnosis not present

## 2023-06-27 ENCOUNTER — Encounter (INDEPENDENT_AMBULATORY_CARE_PROVIDER_SITE_OTHER): Payer: Self-pay | Admitting: Gastroenterology

## 2023-07-23 ENCOUNTER — Ambulatory Visit (INDEPENDENT_AMBULATORY_CARE_PROVIDER_SITE_OTHER): Payer: Medicare Other | Admitting: Gastroenterology

## 2023-07-23 DIAGNOSIS — G4700 Insomnia, unspecified: Secondary | ICD-10-CM | POA: Diagnosis not present

## 2023-07-23 DIAGNOSIS — E119 Type 2 diabetes mellitus without complications: Secondary | ICD-10-CM | POA: Diagnosis not present

## 2023-07-23 DIAGNOSIS — M51369 Other intervertebral disc degeneration, lumbar region without mention of lumbar back pain or lower extremity pain: Secondary | ICD-10-CM | POA: Diagnosis not present

## 2023-07-23 DIAGNOSIS — I10 Essential (primary) hypertension: Secondary | ICD-10-CM | POA: Diagnosis not present

## 2023-07-23 DIAGNOSIS — E6609 Other obesity due to excess calories: Secondary | ICD-10-CM | POA: Diagnosis not present

## 2023-07-23 DIAGNOSIS — Z79899 Other long term (current) drug therapy: Secondary | ICD-10-CM | POA: Diagnosis not present

## 2023-07-23 DIAGNOSIS — Z6837 Body mass index (BMI) 37.0-37.9, adult: Secondary | ICD-10-CM | POA: Diagnosis not present

## 2023-07-23 DIAGNOSIS — E1140 Type 2 diabetes mellitus with diabetic neuropathy, unspecified: Secondary | ICD-10-CM | POA: Diagnosis not present

## 2023-08-02 ENCOUNTER — Ambulatory Visit (INDEPENDENT_AMBULATORY_CARE_PROVIDER_SITE_OTHER): Payer: Medicare Other | Admitting: Gastroenterology

## 2023-08-23 DIAGNOSIS — R030 Elevated blood-pressure reading, without diagnosis of hypertension: Secondary | ICD-10-CM | POA: Diagnosis not present

## 2023-08-23 DIAGNOSIS — G629 Polyneuropathy, unspecified: Secondary | ICD-10-CM | POA: Diagnosis not present

## 2023-08-23 DIAGNOSIS — I10 Essential (primary) hypertension: Secondary | ICD-10-CM | POA: Diagnosis not present

## 2023-08-23 DIAGNOSIS — Z6837 Body mass index (BMI) 37.0-37.9, adult: Secondary | ICD-10-CM | POA: Diagnosis not present

## 2023-08-23 DIAGNOSIS — Z79899 Other long term (current) drug therapy: Secondary | ICD-10-CM | POA: Diagnosis not present

## 2023-08-23 DIAGNOSIS — M51369 Other intervertebral disc degeneration, lumbar region without mention of lumbar back pain or lower extremity pain: Secondary | ICD-10-CM | POA: Diagnosis not present

## 2023-08-23 DIAGNOSIS — C20 Malignant neoplasm of rectum: Secondary | ICD-10-CM | POA: Diagnosis not present

## 2023-08-23 DIAGNOSIS — E6609 Other obesity due to excess calories: Secondary | ICD-10-CM | POA: Diagnosis not present

## 2023-08-27 ENCOUNTER — Ambulatory Visit (INDEPENDENT_AMBULATORY_CARE_PROVIDER_SITE_OTHER): Payer: Medicare Other | Admitting: Gastroenterology

## 2023-08-29 DIAGNOSIS — E1122 Type 2 diabetes mellitus with diabetic chronic kidney disease: Secondary | ICD-10-CM | POA: Diagnosis not present

## 2023-08-29 DIAGNOSIS — N401 Enlarged prostate with lower urinary tract symptoms: Secondary | ICD-10-CM | POA: Diagnosis not present

## 2023-08-29 DIAGNOSIS — N182 Chronic kidney disease, stage 2 (mild): Secondary | ICD-10-CM | POA: Diagnosis not present

## 2023-08-29 DIAGNOSIS — I10 Essential (primary) hypertension: Secondary | ICD-10-CM | POA: Diagnosis not present

## 2023-08-29 DIAGNOSIS — M1049 Other secondary gout, multiple sites: Secondary | ICD-10-CM | POA: Diagnosis not present

## 2023-08-29 DIAGNOSIS — B351 Tinea unguium: Secondary | ICD-10-CM | POA: Diagnosis not present

## 2023-08-29 DIAGNOSIS — G609 Hereditary and idiopathic neuropathy, unspecified: Secondary | ICD-10-CM | POA: Diagnosis not present

## 2023-08-29 DIAGNOSIS — Z85040 Personal history of malignant carcinoid tumor of rectum: Secondary | ICD-10-CM | POA: Diagnosis not present

## 2023-08-29 DIAGNOSIS — M1009 Idiopathic gout, multiple sites: Secondary | ICD-10-CM | POA: Diagnosis not present

## 2023-09-13 ENCOUNTER — Ambulatory Visit (INDEPENDENT_AMBULATORY_CARE_PROVIDER_SITE_OTHER): Payer: Medicare Other | Admitting: Gastroenterology

## 2023-09-13 ENCOUNTER — Encounter (INDEPENDENT_AMBULATORY_CARE_PROVIDER_SITE_OTHER): Payer: Self-pay | Admitting: Gastroenterology

## 2023-09-13 VITALS — BP 148/90 | HR 66 | Temp 97.8°F | Ht 72.0 in | Wt 277.8 lb

## 2023-09-13 DIAGNOSIS — K52831 Collagenous colitis: Secondary | ICD-10-CM | POA: Diagnosis not present

## 2023-09-13 DIAGNOSIS — C20 Malignant neoplasm of rectum: Secondary | ICD-10-CM

## 2023-09-13 DIAGNOSIS — R7989 Other specified abnormal findings of blood chemistry: Secondary | ICD-10-CM | POA: Diagnosis not present

## 2023-09-13 NOTE — Progress Notes (Signed)
 Toribio Louis House, M.D. Gastroenterology & Hepatology [facility] Gastroenterology [address] [place], [place] [address]  Primary Care Physician: Louis House LABOR, MD [address] [place] [address]  I will communicate my assessment and recommendations to the referring MD via EMR.  Problems: Stage III (T2N2) rectal adenocarcinoma status post capecitabine  with XRT, now on FOLFOX Collagenous colitis Elevated LFTs   History of Present Illness: Louis House is a 68 y.o. male with past medical history of rectal adenocarcinoma - Stage IIIB (cT2, cN2a, cM0), collagenous colitis, diabetes, hypertension, gout, hyperlipidemia and anxiety , who presents for follow up of elevated LFTs, rectal cancer and collagenous colitis.  The patient was last seen on 01/11/2023. At that time, the patient was scheduled for surveillance colonoscopy and was advised to finish budesonide  taper.  CMP was checked which showed elevated ALT of 112 and AST of 36.  Due to this, further testing was arm performed including acute hepatitis viral panel, IgG, alpha-1 antitrypsin, smooth muscle antibodies and ANA, ANA was the only test that came back abnormal with a very low titer of 1:40.  It was considered that his elevated liver enzymes were related to  Louis House, he was scheduled for ultrasound in June but the patient did not show up for that appointment.  Patient reports that he finished his budesonide  in [name]. Has been feeling well since then as he very rarely has a loose bowel movement, but no diarrhea. Has had some straining to move his bowels.  The patient denies having any nausea, vomiting, fever, chills, hematochezia, melena, hematemesis, abdominal distention, abdominal pain, jaundice, pruritus or weight loss.  Most recent CT of the chest, abdomen and pelvis with IV contrast on 06/06/2022 showed diffuse symmetric rectal wall thickening without lesion and presacral stranding nodularity.   Underwent an MRI of the pelvis without contrast on 06/15/2022 that showed mild bowel thickening of the rectum..  Notably, his previous plan included 6 month rectal contrast MRI x2 years then yearly MRI x 3 years/repeat flex sig every 4 months x2years then q6 months x3 years.    Most recent LFTs from 05/29/2023 showed ALT of 97, AST 32, alkaline phosphatase 88, total bilirubin 0.7, albumin 3.9.  Last Colonoscopy: 02/28/2023 - One 5 mm polyp in the transverse colon, removed with a cold snare. Resected and retrieved. - Two 2 to 5 mm polyps in the descending colon, removed with a cold snare. Resected and retrieved. - Diverticulosis in the entire examined colon. - Scar at 5 cm proximal to the anus. Biopsied. - A tattoo was seen in the rectum. The tattoo site appeared normal. - The distal rectum and anal verge are normal on retroflexion view.  Pathology showed 3 hyperplastic polyps and biopsies from the scar without presence of recurrent malignancy, repeat colonoscopy in 3 years.     Past Medical History: Past Medical History:  Diagnosis Date   Anxiety    Diabetes (HCC) 01/12/2017   Diarrhea 02/21/2017   Essential hypertension, benign 01/12/2017   Gout    High cholesterol 01/12/2017   Neuropathy    Port-A-Cath in place 07/28/2021   Sleep apnea    Vertigo     Past Surgical History: Past Surgical History:  Procedure Laterality Date   BIOPSY  04/06/2017   Procedure: BIOPSY;  Surgeon: Golda Claudis PENNER, MD;  Location: [facility];  Service: Endoscopy;;  colon   BIOPSY  04/12/2021   Procedure: BIOPSY;  Surgeon: Louis House Angelia Toribio, MD;  Location: [facility];  Service: Gastroenterology;;  nodular area in ascending colon biopsy random colon bx   BIOPSY  02/03/2022   Procedure: BIOPSY;  Surgeon: Debby Hila, MD;  Location: [facility];  Service: Endoscopy;;   BIOPSY  02/28/2023   Procedure: BIOPSY;  Surgeon: Eartha Angelia Sieving, MD;  Location: [facility];  Service:  Gastroenterology;;   bone spurs     CATARACT EXTRACTION W/PHACO Left 04/22/2018   Procedure: CATARACT EXTRACTION PHACO AND INTRAOCULAR LENS PLACEMENT LEFT EYE;  Surgeon: Perley Hamilton, MD;  Location: [facility];  Service: Ophthalmology;  Laterality: Left;  CDE: 7.58   COLONOSCOPY WITH PROPOFOL  N/A 04/06/2017   Procedure: COLONOSCOPY WITH PROPOFOL ;  Surgeon: Golda Claudis PENNER, MD;  [facility];  Service: Endoscopy;  Laterality: N/A;  1:45   COLONOSCOPY WITH PROPOFOL  N/A 04/12/2021   Procedure: COLONOSCOPY WITH PROPOFOL ;  Surgeon: Eartha Angelia Sieving, MD;  [facility];  Service: Gastroenterology;  Laterality: N/A;  9:05   COLONOSCOPY WITH PROPOFOL  N/A 02/28/2023   Procedure: COLONOSCOPY WITH PROPOFOL ;  Surgeon: Eartha Angelia Sieving, MD;  [facility];  Service: Gastroenterology;  Laterality: N/A;  9:00am;asa 1-2   FLEXIBLE SIGMOIDOSCOPY N/A 05/10/2021   Procedure: FLEXIBLE SIGMOIDOSCOPY;  Surgeon: Eartha Angelia Sieving, MD;  [facility]: [facility];  Service: Gastroenterology;  Laterality: N/A;  9:50 ASA 1 Per Dr C no Anesthesia, no PAT - ok per [place]on SIDE SIGMOIDOSCOPY N/A 02/03/2022   Procedure: FLEXIBLE SIGMOIDOSCOPY;  Surgeon: Debby Hila, MD;  Location: [facility];  Service: Endoscopy;  Laterality: N/A;   kneee arthroscopy     both knees   Left shoulder surgery from injury     POLYPECTOMY  02/28/2023   Procedure: POLYPECTOMY;  Surgeon: Eartha Angelia Sieving, MD;  [facility];  Service: Gastroenterology;;   PORTACATH PLACEMENT Left 08/08/2021   Procedure: INSERTION PORT-A-CATH;  Surgeon: Mavis Anes, MD;  Location: [facility];  Service: General;  Laterality: Left;   Umblical hernia      Family History:History reviewed. No pertinent family history.  Social History: Social History   Tobacco Use  Smoking Status Never   Passive exposure: Never  Smokeless Tobacco Never   Social History   Substance and Sexual Activity   Alcohol Use No   Social History   Substance and Sexual Activity  Drug Use No    Allergies: No Known Allergies  Medications: Current Outpatient Medications  Medication Sig Dispense Refill   allopurinol  (ZYLOPRIM ) 300 MG tablet Take 300 mg by mouth in the morning.     amLODipine (NORVASC) 5 MG tablet Take 5 mg by mouth in the morning.     atorvastatin  (LIPITOR) 40 MG tablet Take 40 mg by mouth every evening.     busPIRone  (BUSPAR ) 15 MG tablet Take 15 mg by mouth 2 (two) times daily.     D3 2000 50 MCG (2000 UT) CAPS Take 2,000 Units by mouth in the morning.     diclofenac  (VOLTAREN ) 75 MG EC tablet Take 75 mg by mouth 2 (two) times daily.     gabapentin  (NEURONTIN ) 600 MG tablet Take 600 mg by mouth 3 (three) times daily.     HYDROcodone -acetaminophen  (NORCO) 10-325 MG tablet Take 1 tablet by mouth 3 (three) times daily as needed (pain.).     LANTUS SOLOSTAR 100 UNIT/ML Solostar Pen Inject 20 Units into the skin at bedtime.     loratadine (CLARITIN) 10 MG tablet Take 10 mg by mouth in the morning.  meclizine  (ANTIVERT ) 25 MG tablet Take 25 mg by mouth 3 (three) times daily as needed for dizziness (for flare up of inner ear issues.).     METHYL B-12 5000 MCG CHEW Chew 5,000 mcg by mouth in the morning.     metoprolol  succinate (TOPROL -XL) 100 MG 24 hr tablet Take 100 mg by mouth in the morning.     Naphazoline-Pheniramine (OPCON-A) 0.027-0.315 % SOLN Place 1 drop into both eyes in the morning.     olmesartan (BENICAR) 20 MG tablet Take 20 mg by mouth in the morning.     Omega-3 Fatty Acids (OMEGA-3 FISH OIL PO) Take 3-4 capsules by mouth in the morning.     oxymetazoline (AFRIN) 0.05 % nasal spray Place 1 spray into both nostrils 2 (two) times daily.     traZODone  (DESYREL ) 50 MG tablet Take 50 mg by mouth at bedtime.     diphenoxylate -atropine  (LOMOTIL ) 2.5-0.025 MG tablet Take 1 tablet by mouth 4 (four) times daily as needed for diarrhea or loose stools. (Patient not taking:  Reported on 01/11/2023) 60 tablet 2   Current Facility-Administered Medications  Medication Dose Route Frequency Provider Last Rate Last Admin   dicyclomine  (BENTYL ) tablet 20 mg  20 mg Oral TID AC & HS Setzer, Terri L, NP       Facility-Administered Medications Ordered in Other Visits  Medication Dose Route Frequency Provider Last Rate Last Admin   0.9 %  sodium chloride  infusion   Intravenous Continuous Rogers Hai, MD   Stopped at 10/05/21 1232    Review of Systems: GENERAL: negative for malaise, night sweats HEENT: No changes in hearing or vision, no nose bleeds or other nasal problems. NECK: Negative for lumps, goiter, pain and significant neck swelling RESPIRATORY: Negative for cough, wheezing CARDIOVASCULAR: Negative for chest pain, leg swelling, palpitations, orthopnea GI: SEE HPI MUSCULOSKELETAL: Negative for joint pain or swelling, back pain, and muscle pain. SKIN: Negative for lesions, rash PSYCH: Negative for sleep disturbance, mood disorder and recent psychosocial stressors. HEMATOLOGY Negative for prolonged bleeding, bruising easily, and swollen nodes. ENDOCRINE: Negative for cold or heat intolerance, polyuria, polydipsia and goiter. NEURO: negative for tremor, gait imbalance, syncope and seizures. The remainder of the review of systems is noncontributory.   Physical Exam: BP (!) 148/90 (BP Location: Left Arm, Patient Position: Sitting, Cuff Size: Large)   Pulse 66   Temp 97.8 F (36.6 C) (Temporal)   Ht 6' (1.829 m)   Wt 277 lb 12.8 oz (126 kg)   BMI 37.68 kg/m  GENERAL: The patient is AO x3, in no acute distress.  Obese HEENT: Head is normocephalic and atraumatic. EOMI are intact. Mouth is well hydrated and without lesions. NECK: Supple. No masses LUNGS: Clear to auscultation. No presence of rhonchi/wheezing/rales. Adequate chest expansion HEART: RRR, normal s1 and s2. ABDOMEN: Soft, nontender, no guarding, no peritoneal signs, and nondistended. BS +.  No masses. EXTREMITIES: Without any cyanosis, clubbing, rash, lesions or edema. NEUROLOGIC: AOx3, no focal motor deficit. SKIN: no jaundice, no rashes  Imaging/Labs: as above  I personally reviewed and interpreted the available labs, imaging and endoscopic files.  Impression and Plan: Louis House is a 68 y.o. male with past medical history of rectal adenocarcinoma - Stage IIIB (cT2, cN2a, cM0), collagenous colitis, diabetes, hypertension, gout, hyperlipidemia and anxiety , who presents for follow up of elevated LFTs, rectal cancer and collagenous colitis.  Regarding his rectal cancer, he had a colonoscopy 6 months ago that did not show any recurrence  of cancer.  However, he has not been compliant with a surveillance plan outlined by Dr. Debby and Dr. Rogers.  It is not clear why he did not undergo the MRI that was ordered in the past.  I encouraged the patient to follow-up with Dr. Debby and Dr. Rogers.  He is due for his repeat flexible sigmoidoscopy that should be performed every 4 months.  As he has had compliance issues, I will schedule him for this at this moment.  Thereafter, he will need to have a flexible sigmoidoscopy every 6 months for 3 years.  He is also due for his repeat MRI which I will order at this moment, he will need to have repeat MRI every year for 3 years if no abnormalities are found.  In terms of his elevated LFTs, the patient has presented elevated aminotransferases in the past.  Has had previous workup for this with only abnormality being elevated ANA's with low titer.  Will need to perform a liver ultrasound to evaluate if there is any liver steatosis causing his abnormalities.  If so, he will benefit from a maintenance diet and will calculate NAFLD the score -labs will be ordered today.  Finally, he has not presented recurrence of diarrhea after stopping budesonide .  Hopefully he will not need more treatment for his colitis but if symptoms recur, he will  need to reach us  to start another course of budesonide .  -Patient should reach Dr. Debby and Dr. Rogers for follow-up, I sent them a message to make them aware he will be reaching them for follow-up -Schedule MR pelvis with and without IV contrast -Schedule unsedated flexible sigmoidoscopy -Schedule liver ultrasound -Check CBC and CMP -If patient develops recurrent diarrhea, may need to restart budesonide  with goal to keep on lower dose possible, possibly 3 mg/day  All questions were answered.      [name], MD Gastroenterology and Hepatology [facility] Gastroenterology

## 2023-09-13 NOTE — Patient Instructions (Addendum)
 Please make an appointment for follow-up with Dr. Rogers Please reach Dr. Debby for follow-up Schedule MR pelvis with and without IV contrast Schedule unsedated flexible sigmoidoscopy Schedule liver ultrasound Perform blood workup Please reach his back if you develop recurrent diarrhea, may need to restart you on budesonide  at that time.

## 2023-09-14 ENCOUNTER — Encounter (INDEPENDENT_AMBULATORY_CARE_PROVIDER_SITE_OTHER): Payer: Self-pay

## 2023-09-14 DIAGNOSIS — Z23 Encounter for immunization: Secondary | ICD-10-CM | POA: Diagnosis not present

## 2023-09-14 LAB — COMPREHENSIVE METABOLIC PANEL
AG Ratio: 1.9 (calc) (ref 1.0–2.5)
ALT: 63 U/L — ABNORMAL HIGH (ref 9–46)
AST: 33 U/L (ref 10–35)
Albumin: 4.3 g/dL (ref 3.6–5.1)
Alkaline phosphatase (APISO): 87 U/L (ref 35–144)
BUN: 13 mg/dL (ref 7–25)
CO2: 33 mmol/L — ABNORMAL HIGH (ref 20–32)
Calcium: 9.8 mg/dL (ref 8.6–10.3)
Chloride: 93 mmol/L — ABNORMAL LOW (ref 98–110)
Creat: 0.91 mg/dL (ref 0.70–1.35)
Globulin: 2.3 g/dL (ref 1.9–3.7)
Glucose, Bld: 290 mg/dL — ABNORMAL HIGH (ref 65–139)
Potassium: 3.7 mmol/L (ref 3.5–5.3)
Sodium: 136 mmol/L (ref 135–146)
Total Bilirubin: 0.6 mg/dL (ref 0.2–1.2)
Total Protein: 6.6 g/dL (ref 6.1–8.1)

## 2023-09-14 LAB — CBC WITH DIFFERENTIAL/PLATELET
Absolute Lymphocytes: 1122 {cells}/uL (ref 850–3900)
Absolute Monocytes: 498 {cells}/uL (ref 200–950)
Basophils Absolute: 103 {cells}/uL (ref 0–200)
Basophils Relative: 1.3 %
Eosinophils Absolute: 466 {cells}/uL (ref 15–500)
Eosinophils Relative: 5.9 %
HCT: 41.2 % (ref 38.5–50.0)
Hemoglobin: 13.8 g/dL (ref 13.2–17.1)
MCH: 29.4 pg (ref 27.0–33.0)
MCHC: 33.5 g/dL (ref 32.0–36.0)
MCV: 87.8 fL (ref 80.0–100.0)
MPV: 9.6 fL (ref 7.5–12.5)
Monocytes Relative: 6.3 %
Neutro Abs: 5712 {cells}/uL (ref 1500–7800)
Neutrophils Relative %: 72.3 %
Platelets: 317 10*3/uL (ref 140–400)
RBC: 4.69 10*6/uL (ref 4.20–5.80)
RDW: 12.6 % (ref 11.0–15.0)
Total Lymphocyte: 14.2 %
WBC: 7.9 10*3/uL (ref 3.8–10.8)

## 2023-09-18 ENCOUNTER — Encounter (INDEPENDENT_AMBULATORY_CARE_PROVIDER_SITE_OTHER): Payer: Self-pay

## 2023-09-20 ENCOUNTER — Ambulatory Visit (HOSPITAL_COMMUNITY): Payer: Medicare Other

## 2023-09-21 DIAGNOSIS — C20 Malignant neoplasm of rectum: Secondary | ICD-10-CM | POA: Diagnosis not present

## 2023-09-21 DIAGNOSIS — G629 Polyneuropathy, unspecified: Secondary | ICD-10-CM | POA: Diagnosis not present

## 2023-09-21 DIAGNOSIS — Z79899 Other long term (current) drug therapy: Secondary | ICD-10-CM | POA: Diagnosis not present

## 2023-09-21 DIAGNOSIS — M51369 Other intervertebral disc degeneration, lumbar region without mention of lumbar back pain or lower extremity pain: Secondary | ICD-10-CM | POA: Diagnosis not present

## 2023-09-21 DIAGNOSIS — E6609 Other obesity due to excess calories: Secondary | ICD-10-CM | POA: Diagnosis not present

## 2023-09-21 DIAGNOSIS — I10 Essential (primary) hypertension: Secondary | ICD-10-CM | POA: Diagnosis not present

## 2023-09-21 DIAGNOSIS — Z6837 Body mass index (BMI) 37.0-37.9, adult: Secondary | ICD-10-CM | POA: Diagnosis not present

## 2023-09-21 DIAGNOSIS — R030 Elevated blood-pressure reading, without diagnosis of hypertension: Secondary | ICD-10-CM | POA: Diagnosis not present

## 2023-09-25 ENCOUNTER — Encounter (INDEPENDENT_AMBULATORY_CARE_PROVIDER_SITE_OTHER): Payer: Self-pay

## 2023-09-25 ENCOUNTER — Telehealth (INDEPENDENT_AMBULATORY_CARE_PROVIDER_SITE_OTHER): Payer: Self-pay | Admitting: Gastroenterology

## 2023-09-25 ENCOUNTER — Ambulatory Visit (HOSPITAL_COMMUNITY)
Admission: RE | Admit: 2023-09-25 | Discharge: 2023-09-25 | Disposition: A | Discharge status: Home / Self Care | Payer: Medicare Other | Source: Ambulatory Visit | Attending: Gastroenterology | Admitting: Gastroenterology

## 2023-09-25 ENCOUNTER — Other Ambulatory Visit (INDEPENDENT_AMBULATORY_CARE_PROVIDER_SITE_OTHER): Payer: Self-pay | Admitting: Gastroenterology

## 2023-09-25 DIAGNOSIS — R7989 Other specified abnormal findings of blood chemistry: Secondary | ICD-10-CM

## 2023-09-25 DIAGNOSIS — K52831 Collagenous colitis: Secondary | ICD-10-CM

## 2023-09-25 DIAGNOSIS — C20 Malignant neoplasm of rectum: Secondary | ICD-10-CM | POA: Insufficient documentation

## 2023-09-25 DIAGNOSIS — K5730 Diverticulosis of large intestine without perforation or abscess without bleeding: Secondary | ICD-10-CM | POA: Diagnosis not present

## 2023-09-25 NOTE — Telephone Encounter (Signed)
 FYI-Shane from [facility] called and states that pt has normally had MRI rectal staging protocol . Vincenza Hews states he is going to do MRI WO Rectal Staging. Vincenza Hews states it was in last office notes

## 2023-09-25 NOTE — Telephone Encounter (Signed)
 Thanks, can we switch the order for this please? Thanks

## 2023-09-25 NOTE — Telephone Encounter (Signed)
Order has been changed in system

## 2023-10-01 ENCOUNTER — Ambulatory Visit (HOSPITAL_COMMUNITY): Payer: Medicare Other

## 2023-10-01 ENCOUNTER — Encounter (INDEPENDENT_AMBULATORY_CARE_PROVIDER_SITE_OTHER): Payer: Self-pay

## 2023-10-01 ENCOUNTER — Inpatient Hospital Stay: Payer: Medicare Other | Attending: Internal Medicine

## 2023-10-02 ENCOUNTER — Telehealth (INDEPENDENT_AMBULATORY_CARE_PROVIDER_SITE_OTHER): Payer: Self-pay | Admitting: Gastroenterology

## 2023-10-02 NOTE — Telephone Encounter (Signed)
Pt has rescheduled Flex sig from 10/03/23 to 11/14/23 at 2:15pm. Will send updated instructions. Message sent to endo.  Also changed my chart password to [facility] so pt could access Mychart.

## 2023-10-09 ENCOUNTER — Inpatient Hospital Stay: Payer: Medicare Other | Admitting: Hematology

## 2023-10-15 ENCOUNTER — Encounter (INDEPENDENT_AMBULATORY_CARE_PROVIDER_SITE_OTHER): Payer: Self-pay

## 2023-10-15 ENCOUNTER — Ambulatory Visit: Payer: Medicare Other | Admitting: Hematology

## 2023-10-19 ENCOUNTER — Encounter (INDEPENDENT_AMBULATORY_CARE_PROVIDER_SITE_OTHER): Payer: Self-pay

## 2023-10-19 DIAGNOSIS — G4730 Sleep apnea, unspecified: Secondary | ICD-10-CM | POA: Diagnosis not present

## 2023-10-19 DIAGNOSIS — M51369 Other intervertebral disc degeneration, lumbar region without mention of lumbar back pain or lower extremity pain: Secondary | ICD-10-CM | POA: Diagnosis not present

## 2023-10-19 DIAGNOSIS — G629 Polyneuropathy, unspecified: Secondary | ICD-10-CM | POA: Diagnosis not present

## 2023-10-19 DIAGNOSIS — E1140 Type 2 diabetes mellitus with diabetic neuropathy, unspecified: Secondary | ICD-10-CM | POA: Diagnosis not present

## 2023-10-19 DIAGNOSIS — Z79899 Other long term (current) drug therapy: Secondary | ICD-10-CM | POA: Diagnosis not present

## 2023-10-29 ENCOUNTER — Encounter (INDEPENDENT_AMBULATORY_CARE_PROVIDER_SITE_OTHER): Payer: Self-pay | Admitting: Gastroenterology

## 2023-11-14 ENCOUNTER — Encounter (HOSPITAL_COMMUNITY): Admission: RE | Payer: Self-pay | Source: Home / Self Care

## 2023-11-14 ENCOUNTER — Ambulatory Visit (HOSPITAL_COMMUNITY): Admission: RE | Admit: 2023-11-14 | Payer: Medicare Other | Source: Home / Self Care | Admitting: Gastroenterology

## 2023-11-14 SURGERY — SIGMOIDOSCOPY, FLEXIBLE
Anesthesia: Choice

## 2023-11-19 DIAGNOSIS — E6609 Other obesity due to excess calories: Secondary | ICD-10-CM | POA: Diagnosis not present

## 2023-11-19 DIAGNOSIS — G629 Polyneuropathy, unspecified: Secondary | ICD-10-CM | POA: Diagnosis not present

## 2023-11-19 DIAGNOSIS — M51369 Other intervertebral disc degeneration, lumbar region without mention of lumbar back pain or lower extremity pain: Secondary | ICD-10-CM | POA: Diagnosis not present

## 2023-11-19 DIAGNOSIS — Z6838 Body mass index (BMI) 38.0-38.9, adult: Secondary | ICD-10-CM | POA: Diagnosis not present

## 2023-11-19 DIAGNOSIS — Z79899 Other long term (current) drug therapy: Secondary | ICD-10-CM | POA: Diagnosis not present

## 2023-11-19 DIAGNOSIS — E1140 Type 2 diabetes mellitus with diabetic neuropathy, unspecified: Secondary | ICD-10-CM | POA: Diagnosis not present

## 2023-11-28 DIAGNOSIS — B351 Tinea unguium: Secondary | ICD-10-CM | POA: Diagnosis not present

## 2023-11-28 DIAGNOSIS — E1122 Type 2 diabetes mellitus with diabetic chronic kidney disease: Secondary | ICD-10-CM | POA: Diagnosis not present

## 2023-11-28 DIAGNOSIS — Z85040 Personal history of malignant carcinoid tumor of rectum: Secondary | ICD-10-CM | POA: Diagnosis not present

## 2023-11-28 DIAGNOSIS — G609 Hereditary and idiopathic neuropathy, unspecified: Secondary | ICD-10-CM | POA: Diagnosis not present

## 2023-11-28 DIAGNOSIS — M1009 Idiopathic gout, multiple sites: Secondary | ICD-10-CM | POA: Diagnosis not present

## 2023-11-28 DIAGNOSIS — N401 Enlarged prostate with lower urinary tract symptoms: Secondary | ICD-10-CM | POA: Diagnosis not present

## 2023-11-28 DIAGNOSIS — N182 Chronic kidney disease, stage 2 (mild): Secondary | ICD-10-CM | POA: Diagnosis not present

## 2023-11-28 DIAGNOSIS — Z0000 Encounter for general adult medical examination without abnormal findings: Secondary | ICD-10-CM | POA: Diagnosis not present

## 2023-11-28 DIAGNOSIS — I10 Essential (primary) hypertension: Secondary | ICD-10-CM | POA: Diagnosis not present

## 2023-11-28 DIAGNOSIS — Z1331 Encounter for screening for depression: Secondary | ICD-10-CM | POA: Diagnosis not present

## 2023-11-28 DIAGNOSIS — M1049 Other secondary gout, multiple sites: Secondary | ICD-10-CM | POA: Diagnosis not present

## 2023-11-28 DIAGNOSIS — G4733 Obstructive sleep apnea (adult) (pediatric): Secondary | ICD-10-CM | POA: Diagnosis not present

## 2023-12-18 DIAGNOSIS — E6609 Other obesity due to excess calories: Secondary | ICD-10-CM | POA: Diagnosis not present

## 2023-12-18 DIAGNOSIS — Z79899 Other long term (current) drug therapy: Secondary | ICD-10-CM | POA: Diagnosis not present

## 2023-12-18 DIAGNOSIS — Z6838 Body mass index (BMI) 38.0-38.9, adult: Secondary | ICD-10-CM | POA: Diagnosis not present

## 2023-12-18 DIAGNOSIS — E559 Vitamin D deficiency, unspecified: Secondary | ICD-10-CM | POA: Diagnosis not present

## 2023-12-18 DIAGNOSIS — M51369 Other intervertebral disc degeneration, lumbar region without mention of lumbar back pain or lower extremity pain: Secondary | ICD-10-CM | POA: Diagnosis not present

## 2023-12-18 DIAGNOSIS — E1140 Type 2 diabetes mellitus with diabetic neuropathy, unspecified: Secondary | ICD-10-CM | POA: Diagnosis not present

## 2023-12-18 DIAGNOSIS — G629 Polyneuropathy, unspecified: Secondary | ICD-10-CM | POA: Diagnosis not present

## 2023-12-24 DIAGNOSIS — Z79899 Other long term (current) drug therapy: Secondary | ICD-10-CM | POA: Diagnosis not present

## 2024-01-17 DIAGNOSIS — M5416 Radiculopathy, lumbar region: Secondary | ICD-10-CM | POA: Diagnosis not present

## 2024-01-17 DIAGNOSIS — Z79899 Other long term (current) drug therapy: Secondary | ICD-10-CM | POA: Diagnosis not present

## 2024-01-17 DIAGNOSIS — Z6837 Body mass index (BMI) 37.0-37.9, adult: Secondary | ICD-10-CM | POA: Diagnosis not present

## 2024-01-17 DIAGNOSIS — G629 Polyneuropathy, unspecified: Secondary | ICD-10-CM | POA: Diagnosis not present

## 2024-01-17 DIAGNOSIS — M51369 Other intervertebral disc degeneration, lumbar region without mention of lumbar back pain or lower extremity pain: Secondary | ICD-10-CM | POA: Diagnosis not present

## 2024-01-17 DIAGNOSIS — E6609 Other obesity due to excess calories: Secondary | ICD-10-CM | POA: Diagnosis not present

## 2024-01-17 DIAGNOSIS — E119 Type 2 diabetes mellitus without complications: Secondary | ICD-10-CM | POA: Diagnosis not present

## 2024-01-17 DIAGNOSIS — M549 Dorsalgia, unspecified: Secondary | ICD-10-CM | POA: Diagnosis not present

## 2024-01-17 DIAGNOSIS — R030 Elevated blood-pressure reading, without diagnosis of hypertension: Secondary | ICD-10-CM | POA: Diagnosis not present

## 2024-01-17 DIAGNOSIS — C20 Malignant neoplasm of rectum: Secondary | ICD-10-CM | POA: Diagnosis not present

## 2024-02-21 DIAGNOSIS — C20 Malignant neoplasm of rectum: Secondary | ICD-10-CM | POA: Diagnosis not present

## 2024-02-21 DIAGNOSIS — G629 Polyneuropathy, unspecified: Secondary | ICD-10-CM | POA: Diagnosis not present

## 2024-02-21 DIAGNOSIS — E119 Type 2 diabetes mellitus without complications: Secondary | ICD-10-CM | POA: Diagnosis not present

## 2024-02-21 DIAGNOSIS — M549 Dorsalgia, unspecified: Secondary | ICD-10-CM | POA: Diagnosis not present

## 2024-02-21 DIAGNOSIS — Z6837 Body mass index (BMI) 37.0-37.9, adult: Secondary | ICD-10-CM | POA: Diagnosis not present

## 2024-02-21 DIAGNOSIS — E6609 Other obesity due to excess calories: Secondary | ICD-10-CM | POA: Diagnosis not present

## 2024-02-21 DIAGNOSIS — M51369 Other intervertebral disc degeneration, lumbar region without mention of lumbar back pain or lower extremity pain: Secondary | ICD-10-CM | POA: Diagnosis not present

## 2024-02-21 DIAGNOSIS — Z79899 Other long term (current) drug therapy: Secondary | ICD-10-CM | POA: Diagnosis not present

## 2024-02-27 DIAGNOSIS — F5101 Primary insomnia: Secondary | ICD-10-CM | POA: Diagnosis not present

## 2024-02-27 DIAGNOSIS — Z85040 Personal history of malignant carcinoid tumor of rectum: Secondary | ICD-10-CM | POA: Diagnosis not present

## 2024-02-27 DIAGNOSIS — N182 Chronic kidney disease, stage 2 (mild): Secondary | ICD-10-CM | POA: Diagnosis not present

## 2024-02-27 DIAGNOSIS — G4733 Obstructive sleep apnea (adult) (pediatric): Secondary | ICD-10-CM | POA: Diagnosis not present

## 2024-02-27 DIAGNOSIS — I10 Essential (primary) hypertension: Secondary | ICD-10-CM | POA: Diagnosis not present

## 2024-02-27 DIAGNOSIS — N401 Enlarged prostate with lower urinary tract symptoms: Secondary | ICD-10-CM | POA: Diagnosis not present

## 2024-02-27 DIAGNOSIS — M1009 Idiopathic gout, multiple sites: Secondary | ICD-10-CM | POA: Diagnosis not present

## 2024-02-27 DIAGNOSIS — B351 Tinea unguium: Secondary | ICD-10-CM | POA: Diagnosis not present

## 2024-02-27 DIAGNOSIS — E1122 Type 2 diabetes mellitus with diabetic chronic kidney disease: Secondary | ICD-10-CM | POA: Diagnosis not present

## 2024-02-27 DIAGNOSIS — G609 Hereditary and idiopathic neuropathy, unspecified: Secondary | ICD-10-CM | POA: Diagnosis not present

## 2024-02-29 DIAGNOSIS — G609 Hereditary and idiopathic neuropathy, unspecified: Secondary | ICD-10-CM | POA: Diagnosis not present

## 2024-02-29 DIAGNOSIS — I10 Essential (primary) hypertension: Secondary | ICD-10-CM | POA: Diagnosis not present

## 2024-02-29 DIAGNOSIS — E1122 Type 2 diabetes mellitus with diabetic chronic kidney disease: Secondary | ICD-10-CM | POA: Diagnosis not present

## 2024-02-29 DIAGNOSIS — Z85040 Personal history of malignant carcinoid tumor of rectum: Secondary | ICD-10-CM | POA: Diagnosis not present

## 2024-02-29 DIAGNOSIS — N182 Chronic kidney disease, stage 2 (mild): Secondary | ICD-10-CM | POA: Diagnosis not present

## 2024-02-29 DIAGNOSIS — B351 Tinea unguium: Secondary | ICD-10-CM | POA: Diagnosis not present

## 2024-02-29 DIAGNOSIS — N401 Enlarged prostate with lower urinary tract symptoms: Secondary | ICD-10-CM | POA: Diagnosis not present

## 2024-02-29 DIAGNOSIS — M1009 Idiopathic gout, multiple sites: Secondary | ICD-10-CM | POA: Diagnosis not present

## 2024-03-18 ENCOUNTER — Ambulatory Visit (INDEPENDENT_AMBULATORY_CARE_PROVIDER_SITE_OTHER): Payer: Medicare Other | Admitting: Gastroenterology

## 2024-03-19 DIAGNOSIS — M51369 Other intervertebral disc degeneration, lumbar region without mention of lumbar back pain or lower extremity pain: Secondary | ICD-10-CM | POA: Diagnosis not present

## 2024-03-19 DIAGNOSIS — Z6836 Body mass index (BMI) 36.0-36.9, adult: Secondary | ICD-10-CM | POA: Diagnosis not present

## 2024-03-19 DIAGNOSIS — C20 Malignant neoplasm of rectum: Secondary | ICD-10-CM | POA: Diagnosis not present

## 2024-03-19 DIAGNOSIS — E6609 Other obesity due to excess calories: Secondary | ICD-10-CM | POA: Diagnosis not present

## 2024-03-19 DIAGNOSIS — Z1331 Encounter for screening for depression: Secondary | ICD-10-CM | POA: Diagnosis not present

## 2024-03-19 DIAGNOSIS — M549 Dorsalgia, unspecified: Secondary | ICD-10-CM | POA: Diagnosis not present

## 2024-03-19 DIAGNOSIS — Z6837 Body mass index (BMI) 37.0-37.9, adult: Secondary | ICD-10-CM | POA: Diagnosis not present

## 2024-03-19 DIAGNOSIS — G629 Polyneuropathy, unspecified: Secondary | ICD-10-CM | POA: Diagnosis not present

## 2024-03-19 DIAGNOSIS — Z79899 Other long term (current) drug therapy: Secondary | ICD-10-CM | POA: Diagnosis not present

## 2024-03-19 DIAGNOSIS — E1140 Type 2 diabetes mellitus with diabetic neuropathy, unspecified: Secondary | ICD-10-CM | POA: Diagnosis not present

## 2024-03-27 ENCOUNTER — Ambulatory Visit (INDEPENDENT_AMBULATORY_CARE_PROVIDER_SITE_OTHER): Payer: Medicare Other | Admitting: Gastroenterology

## 2024-03-27 ENCOUNTER — Encounter (INDEPENDENT_AMBULATORY_CARE_PROVIDER_SITE_OTHER): Payer: Self-pay | Admitting: Gastroenterology

## 2024-03-27 VITALS — BP 131/84 | HR 67 | Temp 98.1°F | Ht 72.0 in | Wt 270.2 lb

## 2024-03-27 DIAGNOSIS — C20 Malignant neoplasm of rectum: Secondary | ICD-10-CM

## 2024-03-27 DIAGNOSIS — Z85048 Personal history of other malignant neoplasm of rectum, rectosigmoid junction, and anus: Secondary | ICD-10-CM

## 2024-03-27 DIAGNOSIS — K7581 Nonalcoholic steatohepatitis (NASH): Secondary | ICD-10-CM

## 2024-03-27 NOTE — Progress Notes (Addendum)
 Referring Provider: Orpha Yancey LABOR, MD Primary Care Physician:  Orpha Yancey LABOR, MD Primary GI Physician: Dr. Eartha   Chief Complaint  Patient presents with   Follow-up    Pt arrives for follow up. Pt is due for flex sig-was scheduled in Feb but was cancelled.   HPI:   Louis House is a 68 y.o. male with past medical history of rectal adenocarcinoma - Stage IIIB (cT2, cN2a, cM0) in August 2022, collagenous colitis (finished steroids in oct 2024), diabetes, hypertension, gout, hyperlipidemia and anxiety   Patient presenting today for:  Follow up of history of rectal adenocarcinoma, NASH   Last seen January, at that time had finished budesonide  in October, rarely having a loose stool, no diarrhea.   Recommended to schedule MRI pelvis with and without contrast, schedule flexible sigmoidoscopy, liver US , CBC, CMP, restart budesonide  if recurrence of diarrhea  MRI pelvis with rectal CA staging done 09/2023 with no evidence of residual or metastatic disease  US  RUQ 09/2023 No acute abnormality identified. Increased echotexture of the liver. This is a nonspecific finding but can be seen in fatty infiltration of liver  Labs in January with AST 33 (36), ALT 63 (122), ALk phos 87 t bili 0.6, platelets 317  Patient was scheduled for flex sig in march but this was cancelled  Present:  Doing well from GI standpoint. He has not seen Dr. MARLA or Dr. Debby. He states they reached out to him but he did not schedule a follow up. 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 He feels none of his providers have taken any concern about this, he is on gabapentin  from his pain clinic which helps to a certain extent but he is still having a lot of issues with this which are affecting his daily life. As far as GI, he has no diarrhea, rectal bleeding or abdominal pain, nausea, vomiting, changes in appetite or weight loss. He is amenable to scheduling flexible sigmoidoscopy that  was previously canceled.   Pertinent history: 01/2023: acute hepatitis viral panel, IgG, alpha-1 antitrypsin, smooth muscle antibodies and ANA ANA was the only test that came back abnormal with a very low titer of 1:40.  considered that his elevated liver enzymes were related to NASH   Last Colonoscopy: 02/28/2023 - One 5 mm polyp in the transverse colon, removed with a cold snare. Resected and retrieved. - Two 2 to 5 mm polyps in the descending colon, removed with a cold snare. Resected and retrieved. - Diverticulosis in the entire examined colon. - Scar at 5 cm proximal to the anus. Biopsied. - A tattoo was seen in the rectum. The tattoo site appeared normal. - The distal rectum and anal verge are normal on retroflexion view.   Pathology showed 3 hyperplastic polyps and biopsies from the scar without presence of recurrent malignancy, repeat colonoscopy in 3 years.    Filed Weights   03/27/24 1411  Weight: 270 lb 3.2 oz (122.6 kg)     Past Medical History:  Diagnosis Date   Anxiety    Diabetes (HCC) 01/12/2017   Diarrhea 02/21/2017   Essential hypertension, benign 01/12/2017   Gout    High cholesterol 01/12/2017   Neuropathy    Port-A-Cath in place 07/28/2021   Sleep apnea    Vertigo     Past Surgical History:  Procedure Laterality Date   BIOPSY  04/06/2017   Procedure: BIOPSY;  Surgeon: Golda Claudis PENNER, MD;  [facility];  Service: Endoscopy;;  colon   BIOPSY  04/12/2021   Procedure: BIOPSY;  Surgeon: Eartha Flavors, Toribio, MD;  Location: [facility];  Service: Gastroenterology;;  nodular area in ascending colon biopsy random colon bx   BIOPSY  02/03/2022   Procedure: BIOPSY;  Surgeon: Debby Hila, MD;  Location: [facility];  Service: Endoscopy;;   BIOPSY  02/28/2023   Procedure: BIOPSY;  Surgeon: Eartha Flavors Toribio, MD;  [facility]: [facility];  Service: Gastroenterology;;   bone spurs     CATARACT EXTRACTION W/PHACO Left 04/22/2018   Procedure: CATARACT  EXTRACTION PHACO AND INTRAOCULAR LENS PLACEMENT LEFT EYE;  Surgeon: Perley Hamilton, MD;  Location: [facility];  Service: Ophthalmology;  Laterality: Left;  CDE: 7.58   COLONOSCOPY WITH PROPOFOL  N/A 04/06/2017   Procedure: COLONOSCOPY WITH PROPOFOL ;  Surgeon: Golda Claudis PENNER, MD;  [facility];  Service: Endoscopy;  Laterality: N/A;  1:45   COLONOSCOPY WITH PROPOFOL  N/A 04/12/2021   Procedure: COLONOSCOPY WITH PROPOFOL ;  Surgeon: Eartha Flavors Toribio, MD;  [facility];  Service: Gastroenterology;  Laterality: N/A;  9:05   COLONOSCOPY WITH PROPOFOL  N/A 02/28/2023   Procedure: COLONOSCOPY WITH PROPOFOL ;  Surgeon: Eartha Flavors Toribio, MD;  [facility];  Service: Gastroenterology;  Laterality: N/A;  9:00am;asa 1-2   FLEXIBLE SIGMOIDOSCOPY N/A 05/10/2021   Procedure: FLEXIBLE SIGMOIDOSCOPY;  Surgeon: Eartha Flavors Toribio, MD;  [facility];  Service: Gastroenterology;  Laterality: N/A;  9:50 ASA 1 Per Dr C no Anesthesia, no PAT - ok per [place] SIDE SIGMOIDOSCOPY N/A 02/03/2022   Procedure: FLEXIBLE SIGMOIDOSCOPY;  Surgeon: Debby Hila, MD;  [facility];  Service: Endoscopy;  Laterality: N/A;   kneee arthroscopy     both knees   Left shoulder surgery from injury     POLYPECTOMY  02/28/2023   Procedure: POLYPECTOMY;  Surgeon: Eartha Flavors Toribio, MD;  [facility];  Service: Gastroenterology;;   PORTACATH PLACEMENT Left 08/08/2021   Procedure: INSERTION PORT-A-CATH;  Surgeon: Mavis Anes, MD;  Location: [facility];  Service: General;  Laterality: Left;   Umblical hernia      Current Outpatient Medications  Medication Sig Dispense Refill   allopurinol  (ZYLOPRIM ) 300 MG tablet Take 300 mg by mouth in the morning.     amLODipine (NORVASC) 5 MG tablet Take 5 mg by mouth in the morning.     atorvastatin  (LIPITOR) 40 MG tablet Take 40 mg by mouth every evening.     busPIRone  (BUSPAR ) 15 MG tablet Take 15 mg by mouth 2  (two) times daily.     D3 2000 50 MCG (2000 UT) CAPS Take 2,000 Units by mouth in the morning.     diclofenac  (VOLTAREN ) 75 MG EC tablet Take 75 mg by mouth 2 (two) times daily.     gabapentin  (NEURONTIN ) 600 MG tablet Take 600 mg by mouth 3 (three) times daily.     HYDROcodone -acetaminophen  (NORCO) 10-325 MG tablet Take 1 tablet by mouth 3 (three) times daily as needed (pain.).     LANTUS SOLOSTAR 100 UNIT/ML Solostar Pen Inject 20 Units into the skin at bedtime.     loratadine (CLARITIN) 10 MG tablet Take 10 mg by mouth in the morning.     meclizine  (ANTIVERT ) 25 MG tablet Take 25 mg by mouth 3 (three) times daily as needed for dizziness (for flare up of inner ear issues.).     METHYL B-12 5000 MCG CHEW Chew 5,000 mcg by mouth in the  morning.     metoprolol  succinate (TOPROL -XL) 100 MG 24 hr tablet Take 100 mg by mouth in the morning.     Naphazoline-Pheniramine (OPCON-A) 0.027-0.315 % SOLN Place 1 drop into both eyes in the morning.     olmesartan (BENICAR) 20 MG tablet Take 20 mg by mouth in the morning.     Omega-3 Fatty Acids (OMEGA-3 FISH OIL PO) Take 3-4 capsules by mouth in the morning.     oxymetazoline (AFRIN) 0.05 % nasal spray Place 1 spray into both nostrils 2 (two) times daily.     traZODone  (DESYREL ) 50 MG tablet Take 50 mg by mouth at bedtime.     Current Facility-Administered Medications  Medication Dose Route Frequency Provider Last Rate Last Admin   dicyclomine  (BENTYL ) tablet 20 mg  20 mg Oral TID AC & HS Setzer, Terri L, NP       Facility-Administered Medications Ordered in Other Visits  Medication Dose Route Frequency Provider Last Rate Last Admin   0.9 %  sodium chloride  infusion   Intravenous Continuous Rogers Hai, MD   Stopped at 10/05/21 1232    Allergies as of 03/27/2024   (No Known Allergies)    Social History   Socioeconomic History   Marital status: Divorced    Spouse name: Not on file   Number of children: Not on file   Years of education:  Not on file   Highest education level: Not on file  Occupational History   Not on file  Tobacco Use   Smoking status: Never    Passive exposure: Never   Smokeless tobacco: Never  Vaping Use   Vaping status: Never Used  Substance and Sexual Activity   Alcohol use: No   Drug use: No   Sexual activity: Yes    Birth control/protection: None  Other Topics Concern   Not on file  Social History Narrative   Not on file   Social Drivers of Health   Financial Resource Strain: Low Risk  (05/02/2021)   Overall Financial Resource Strain (CARDIA)    Difficulty of Paying Living Expenses: Not very hard  Food Insecurity: No Food Insecurity (05/02/2021)   Hunger Vital Sign    Worried About Running Out of Food in the Last Year: Never true    Ran Out of Food in the Last Year: Never true  Transportation Needs: No Transportation Needs (05/02/2021)   PRAPARE - Transportation    Lack of Transportation (Medical): No    Lack of Transportation (Non-Medical): No  Physical Activity: Insufficiently Active (05/02/2021)   Exercise Vital Sign    Days of Exercise per Week: 2 days    Minutes of Exercise per Session: 20 min  Stress: No Stress Concern Present (05/02/2021)   [employer] of Occupational Health - Occupational Stress Questionnaire    Feeling of Stress : Not at all  Social Connections: Socially Isolated (05/02/2021)   Social Connection and Isolation Panel    Frequency of Communication with Friends and Family: More than three times a week    Frequency of Social Gatherings with Friends and Family: More than three times a week    Attends Religious Services: Never    Database administrator or Organizations: No    Attends Engineer, structural: Never    Marital Status: Divorced    Review of systems General: negative for malaise, night sweats, fever, chills, weight loss Neck: Negative for lumps, goiter, pain and significant neck swelling Resp: Negative for cough, wheezing, dyspnea at  rest CV: Negative for chest pain, leg swelling, palpitations, orthopnea GI: denies melena, hematochezia, nausea, vomiting, diarrhea, constipation, dysphagia, odyonophagia, early satiety or unintentional weight loss.  MSK: Negative for joint pain or swelling, back pain, and muscle pain. Derm: Negative for itching or rash Psych: Denies depression, anxiety, memory loss, confusion. No homicidal or suicidal ideation.  Heme: Negative for prolonged bleeding, bruising easily, and swollen nodes. Endocrine: Negative for cold or heat intolerance, polyuria, polydipsia and goiter. Neuro: negative for tremor, gait imbalance, syncope and seizures +neuropathy  The remainder of the review of systems is noncontributory.  Physical Exam: BP 131/84   Pulse 67   Temp 98.1 F (36.7 C)   Ht 6' (1.829 m)   Wt 270 lb 3.2 oz (122.6 kg)   BMI 36.65 kg/m  General:   Alert and oriented. No distress noted. Pleasant and cooperative.  Head:  Normocephalic and atraumatic. Eyes:  Conjuctiva clear without scleral icterus. Mouth:  Oral mucosa pink and moist. Good dentition. No lesions. Heart: Normal rate and rhythm, s1 and s2 heart sounds present.  Lungs: Clear lung sounds in all lobes. Respirations equal and unlabored. Abdomen:  +BS, soft, non-tender and non-distended. No rebound or guarding. No HSM or masses noted. Derm: No palmar erythema or jaundice Msk:  Symmetrical without gross deformities. Normal posture. Extremities:  Without edema. Neurologic:  Alert and  oriented x4 Psych:  Alert and cooperative. Normal mood and affect.  Invalid input(s): 6 MONTHS   ASSESSMENT: ANAY RATHE is a 68 y.o. male presenting today for follow up of history of adenocarcinoma of the rectum, NASH   History of rectal cancer: Patient overdue for flexible sigmoidoscopy, was cancelled in march. We will get this scheduled today.  he will need to have a flexible sigmoidoscopy every 6 months for 3 years. He is up to date on MRI as  of January, he will need to have repeat MRI yearly for 3 years if no abnormalities are found. He will be due for MRI again in January 2026. He has not followed up with Dr. Rogers or Dr. Debby as previously recommended, he was encouraged to reach out to them for follow up. He reports a lot of issues with neuropathy since his chemotherapy, I recommended he discuss referral to neurology with his PCP regarding this.   NASH:  ongoing elevated aminotransferases in the past.  previous workup for this with only abnormality being elevated ANA's with low titer.  Recent liver US  with presence of fatty liver/NASH.  His NAFLD score is  -2.15, indicative of F0-F2 and reassuringly his LFTs had improved in January. He is not able to do much physical activity due to his neuropathy but was encouraged to do as much as he is able and implement mediterranean diet.    PLAN:  -schedule Flex sig ASA III  -follow up with Dr. Lovey. Debby -continue mediterranean diet and routine exercise for good weight management -if diarrhea were to recur, would need to restart budesonide  -MRI due in January 2026  All questions were answered, patient verbalized understanding and is in agreement with plan as outlined above.   Follow Up: 6 months   Louis Ehrmann L. Sharyon Peitz, MSN, APRN, AGNP-C Adult-Gerontology Nurse Practitioner [facility]  I have reviewed the note and agree with the APP's assessment as described in this progress note  Unfortunately, the patient has had significant compliance issues in terms of his surveillance with multidisciplinary team (CR surgeon and oncology), as well as with my office.  We will schedule him for repeat flexible sigmoidoscopy per protocol - hopefully he has not developed recurrence of his disease.  [name], MD Gastroenterology and Hepatology [facility]

## 2024-03-27 NOTE — Patient Instructions (Signed)
 We will get you scheduled for flexible sigmoidoscopy As discussed, it is recommended that you continue to follow up with your oncologist and surgeon for your history of rectal cancer MRI due again next January Please discuss neurologist referral with your PCP for your neuropathy Try to get as much Physical activity as you are able, and work on implementing the [employer] for your history of fatty liver  Follow up 6 months  It was a pleasure to see you today. I want to create trusting relationships with patients and provide genuine, compassionate, and quality care. I truly value your feedback! please be on the lookout for a survey regarding your visit with me today. I appreciate your input about our visit and your time in completing this!    Alexsandra Shontz L. Macklin Jacquin, MSN, APRN, AGNP-C Adult-Gerontology Nurse Practitioner [facility] at [facility]

## 2024-03-28 ENCOUNTER — Telehealth: Payer: Self-pay | Admitting: *Deleted

## 2024-03-28 NOTE — Telephone Encounter (Signed)
 Attempted to call pt, unable to leave message due to mailbox being full   Flex sig asa 3, Dr.Castaneda

## 2024-04-03 ENCOUNTER — Encounter (HOSPITAL_BASED_OUTPATIENT_CLINIC_OR_DEPARTMENT_OTHER): Payer: Self-pay | Admitting: Internal Medicine

## 2024-04-03 DIAGNOSIS — G4733 Obstructive sleep apnea (adult) (pediatric): Secondary | ICD-10-CM

## 2024-04-15 NOTE — Procedures (Signed)
 Orders only

## 2024-04-18 DIAGNOSIS — Z79899 Other long term (current) drug therapy: Secondary | ICD-10-CM | POA: Diagnosis not present

## 2024-04-18 DIAGNOSIS — I10 Essential (primary) hypertension: Secondary | ICD-10-CM | POA: Diagnosis not present

## 2024-04-18 DIAGNOSIS — G629 Polyneuropathy, unspecified: Secondary | ICD-10-CM | POA: Diagnosis not present

## 2024-04-18 DIAGNOSIS — E1140 Type 2 diabetes mellitus with diabetic neuropathy, unspecified: Secondary | ICD-10-CM | POA: Diagnosis not present

## 2024-04-18 DIAGNOSIS — M51369 Other intervertebral disc degeneration, lumbar region without mention of lumbar back pain or lower extremity pain: Secondary | ICD-10-CM | POA: Diagnosis not present

## 2024-04-18 DIAGNOSIS — M549 Dorsalgia, unspecified: Secondary | ICD-10-CM | POA: Diagnosis not present

## 2024-04-18 DIAGNOSIS — C20 Malignant neoplasm of rectum: Secondary | ICD-10-CM | POA: Diagnosis not present

## 2024-04-18 DIAGNOSIS — E6609 Other obesity due to excess calories: Secondary | ICD-10-CM | POA: Diagnosis not present

## 2024-04-24 ENCOUNTER — Encounter: Payer: Self-pay | Admitting: *Deleted

## 2024-04-24 NOTE — Telephone Encounter (Signed)
 Attempted to call pt, unable to leave message due to mailbox being full. Letter mailed.

## 2024-05-19 DIAGNOSIS — M51369 Other intervertebral disc degeneration, lumbar region without mention of lumbar back pain or lower extremity pain: Secondary | ICD-10-CM | POA: Diagnosis not present

## 2024-05-19 DIAGNOSIS — E1140 Type 2 diabetes mellitus with diabetic neuropathy, unspecified: Secondary | ICD-10-CM | POA: Diagnosis not present

## 2024-05-19 DIAGNOSIS — Z1159 Encounter for screening for other viral diseases: Secondary | ICD-10-CM | POA: Diagnosis not present

## 2024-05-19 DIAGNOSIS — Z6835 Body mass index (BMI) 35.0-35.9, adult: Secondary | ICD-10-CM | POA: Diagnosis not present

## 2024-05-19 DIAGNOSIS — Z79899 Other long term (current) drug therapy: Secondary | ICD-10-CM | POA: Diagnosis not present

## 2024-05-19 DIAGNOSIS — M549 Dorsalgia, unspecified: Secondary | ICD-10-CM | POA: Diagnosis not present

## 2024-05-19 DIAGNOSIS — C20 Malignant neoplasm of rectum: Secondary | ICD-10-CM | POA: Diagnosis not present

## 2024-05-19 DIAGNOSIS — I10 Essential (primary) hypertension: Secondary | ICD-10-CM | POA: Diagnosis not present

## 2024-05-19 DIAGNOSIS — E6609 Other obesity due to excess calories: Secondary | ICD-10-CM | POA: Diagnosis not present

## 2024-05-19 DIAGNOSIS — Z125 Encounter for screening for malignant neoplasm of prostate: Secondary | ICD-10-CM | POA: Diagnosis not present

## 2024-05-19 DIAGNOSIS — Z7289 Other problems related to lifestyle: Secondary | ICD-10-CM | POA: Diagnosis not present

## 2024-05-19 DIAGNOSIS — G629 Polyneuropathy, unspecified: Secondary | ICD-10-CM | POA: Diagnosis not present

## 2024-06-17 DIAGNOSIS — M549 Dorsalgia, unspecified: Secondary | ICD-10-CM | POA: Diagnosis not present

## 2024-06-17 DIAGNOSIS — Z8639 Personal history of other endocrine, nutritional and metabolic disease: Secondary | ICD-10-CM | POA: Diagnosis not present

## 2024-06-17 DIAGNOSIS — G629 Polyneuropathy, unspecified: Secondary | ICD-10-CM | POA: Diagnosis not present

## 2024-06-17 DIAGNOSIS — Z6835 Body mass index (BMI) 35.0-35.9, adult: Secondary | ICD-10-CM | POA: Diagnosis not present

## 2024-06-17 DIAGNOSIS — M51369 Other intervertebral disc degeneration, lumbar region without mention of lumbar back pain or lower extremity pain: Secondary | ICD-10-CM | POA: Diagnosis not present

## 2024-06-17 DIAGNOSIS — Z79899 Other long term (current) drug therapy: Secondary | ICD-10-CM | POA: Diagnosis not present

## 2024-06-17 DIAGNOSIS — C20 Malignant neoplasm of rectum: Secondary | ICD-10-CM | POA: Diagnosis not present

## 2024-06-17 DIAGNOSIS — I10 Essential (primary) hypertension: Secondary | ICD-10-CM | POA: Diagnosis not present

## 2024-06-17 DIAGNOSIS — E6609 Other obesity due to excess calories: Secondary | ICD-10-CM | POA: Diagnosis not present

## 2024-06-20 DIAGNOSIS — H16141 Punctate keratitis, right eye: Secondary | ICD-10-CM | POA: Diagnosis not present

## 2024-06-25 ENCOUNTER — Encounter (INDEPENDENT_AMBULATORY_CARE_PROVIDER_SITE_OTHER): Payer: Self-pay | Admitting: Gastroenterology

## 2024-06-25 DIAGNOSIS — Z23 Encounter for immunization: Secondary | ICD-10-CM | POA: Diagnosis not present

## 2024-06-26 DIAGNOSIS — G4733 Obstructive sleep apnea (adult) (pediatric): Secondary | ICD-10-CM | POA: Diagnosis not present

## 2024-06-26 DIAGNOSIS — G609 Hereditary and idiopathic neuropathy, unspecified: Secondary | ICD-10-CM | POA: Diagnosis not present

## 2024-06-26 DIAGNOSIS — F5101 Primary insomnia: Secondary | ICD-10-CM | POA: Diagnosis not present

## 2024-06-26 DIAGNOSIS — I10 Essential (primary) hypertension: Secondary | ICD-10-CM | POA: Diagnosis not present

## 2024-06-26 DIAGNOSIS — N401 Enlarged prostate with lower urinary tract symptoms: Secondary | ICD-10-CM | POA: Diagnosis not present

## 2024-06-26 DIAGNOSIS — Z85040 Personal history of malignant carcinoid tumor of rectum: Secondary | ICD-10-CM | POA: Diagnosis not present

## 2024-06-26 DIAGNOSIS — N182 Chronic kidney disease, stage 2 (mild): Secondary | ICD-10-CM | POA: Diagnosis not present

## 2024-06-26 DIAGNOSIS — M1009 Idiopathic gout, multiple sites: Secondary | ICD-10-CM | POA: Diagnosis not present

## 2024-06-26 DIAGNOSIS — E1122 Type 2 diabetes mellitus with diabetic chronic kidney disease: Secondary | ICD-10-CM | POA: Diagnosis not present

## 2024-06-30 DIAGNOSIS — Z23 Encounter for immunization: Secondary | ICD-10-CM | POA: Diagnosis not present

## 2024-07-04 DIAGNOSIS — I10 Essential (primary) hypertension: Secondary | ICD-10-CM | POA: Diagnosis not present

## 2024-07-04 DIAGNOSIS — E782 Mixed hyperlipidemia: Secondary | ICD-10-CM | POA: Diagnosis not present

## 2024-07-04 DIAGNOSIS — E1122 Type 2 diabetes mellitus with diabetic chronic kidney disease: Secondary | ICD-10-CM | POA: Diagnosis not present

## 2024-07-17 DIAGNOSIS — E6609 Other obesity due to excess calories: Secondary | ICD-10-CM | POA: Diagnosis not present

## 2024-07-17 DIAGNOSIS — C20 Malignant neoplasm of rectum: Secondary | ICD-10-CM | POA: Diagnosis not present

## 2024-07-17 DIAGNOSIS — Z8639 Personal history of other endocrine, nutritional and metabolic disease: Secondary | ICD-10-CM | POA: Diagnosis not present

## 2024-07-17 DIAGNOSIS — I10 Essential (primary) hypertension: Secondary | ICD-10-CM | POA: Diagnosis not present

## 2024-07-17 DIAGNOSIS — Z6835 Body mass index (BMI) 35.0-35.9, adult: Secondary | ICD-10-CM | POA: Diagnosis not present

## 2024-07-17 DIAGNOSIS — Z79899 Other long term (current) drug therapy: Secondary | ICD-10-CM | POA: Diagnosis not present

## 2024-07-17 DIAGNOSIS — G629 Polyneuropathy, unspecified: Secondary | ICD-10-CM | POA: Diagnosis not present

## 2024-07-17 DIAGNOSIS — M549 Dorsalgia, unspecified: Secondary | ICD-10-CM | POA: Diagnosis not present

## 2024-07-17 DIAGNOSIS — M51369 Other intervertebral disc degeneration, lumbar region without mention of lumbar back pain or lower extremity pain: Secondary | ICD-10-CM | POA: Diagnosis not present

## 2024-07-31 DIAGNOSIS — N182 Chronic kidney disease, stage 2 (mild): Secondary | ICD-10-CM | POA: Diagnosis not present

## 2024-07-31 DIAGNOSIS — I10 Essential (primary) hypertension: Secondary | ICD-10-CM | POA: Diagnosis not present

## 2024-08-15 DIAGNOSIS — G629 Polyneuropathy, unspecified: Secondary | ICD-10-CM | POA: Diagnosis not present

## 2024-08-15 DIAGNOSIS — Z8639 Personal history of other endocrine, nutritional and metabolic disease: Secondary | ICD-10-CM | POA: Diagnosis not present

## 2024-08-15 DIAGNOSIS — M51369 Other intervertebral disc degeneration, lumbar region without mention of lumbar back pain or lower extremity pain: Secondary | ICD-10-CM | POA: Diagnosis not present

## 2024-08-15 DIAGNOSIS — I10 Essential (primary) hypertension: Secondary | ICD-10-CM | POA: Diagnosis not present

## 2024-08-15 DIAGNOSIS — M549 Dorsalgia, unspecified: Secondary | ICD-10-CM | POA: Diagnosis not present

## 2024-08-15 DIAGNOSIS — Z794 Long term (current) use of insulin: Secondary | ICD-10-CM | POA: Diagnosis not present

## 2024-08-15 DIAGNOSIS — Z6835 Body mass index (BMI) 35.0-35.9, adult: Secondary | ICD-10-CM | POA: Diagnosis not present

## 2024-08-15 DIAGNOSIS — E6609 Other obesity due to excess calories: Secondary | ICD-10-CM | POA: Diagnosis not present

## 2024-08-15 DIAGNOSIS — E119 Type 2 diabetes mellitus without complications: Secondary | ICD-10-CM | POA: Diagnosis not present

## 2024-08-15 DIAGNOSIS — C20 Malignant neoplasm of rectum: Secondary | ICD-10-CM | POA: Diagnosis not present

## 2024-08-15 DIAGNOSIS — Z79899 Other long term (current) drug therapy: Secondary | ICD-10-CM | POA: Diagnosis not present

## 2024-10-02 ENCOUNTER — Encounter (INDEPENDENT_AMBULATORY_CARE_PROVIDER_SITE_OTHER): Payer: Self-pay | Admitting: *Deleted
